# Patient Record
Sex: Female | Born: 1970
Health system: Southern US, Community
[De-identification: ages and names within clinical notes are randomized; demographics above are authoritative.]

## PROBLEM LIST (undated history)

## (undated) DIAGNOSIS — T7840XA Allergy, unspecified, initial encounter: Secondary | ICD-10-CM

## (undated) DIAGNOSIS — E0590 Thyrotoxicosis, unspecified without thyrotoxic crisis or storm: Secondary | ICD-10-CM

## (undated) DIAGNOSIS — C801 Malignant (primary) neoplasm, unspecified: Secondary | ICD-10-CM

## (undated) DIAGNOSIS — G4700 Insomnia, unspecified: Secondary | ICD-10-CM

## (undated) DIAGNOSIS — E785 Hyperlipidemia, unspecified: Secondary | ICD-10-CM

## (undated) DIAGNOSIS — R110 Nausea: Secondary | ICD-10-CM

## (undated) DIAGNOSIS — F329 Major depressive disorder, single episode, unspecified: Secondary | ICD-10-CM

## (undated) DIAGNOSIS — E039 Hypothyroidism, unspecified: Secondary | ICD-10-CM

## (undated) DIAGNOSIS — J309 Allergic rhinitis, unspecified: Secondary | ICD-10-CM

## (undated) DIAGNOSIS — R1013 Epigastric pain: Secondary | ICD-10-CM

## (undated) DIAGNOSIS — R740 Nonspecific elevation of levels of transaminase and lactic acid dehydrogenase [LDH]: Secondary | ICD-10-CM

## (undated) HISTORY — DX: Major depressive disorder, single episode, unspecified: F32.9

## (undated) HISTORY — DX: Hyperlipidemia, unspecified: E78.5

## (undated) HISTORY — PX: OTHER SURGICAL HISTORY: SHX169

## (undated) HISTORY — DX: Hypothyroidism, unspecified: E03.9

## (undated) HISTORY — DX: Thyrotoxicosis, unspecified without thyrotoxic crisis or storm: E05.90

## (undated) HISTORY — PX: TUBAL LIGATION: SHX77

## (undated) HISTORY — DX: Allergic rhinitis, unspecified: J30.9

## (undated) HISTORY — DX: Insomnia, unspecified: G47.00

## (undated) HISTORY — DX: Nausea: R11.0

## (undated) HISTORY — DX: Allergy, unspecified, initial encounter: T78.40XA

## (undated) HISTORY — DX: Nonspecific elevation of levels of transaminase and lactic acid dehydrogenase (ldh): R74.0

## (undated) HISTORY — PX: APPENDECTOMY: SHX54

## (undated) HISTORY — DX: Epigastric pain: R10.13

---

## 1999-03-28 ENCOUNTER — Other Ambulatory Visit: Admission: RE | Admit: 1999-03-28 | Discharge: 1999-03-28 | Payer: Self-pay | Admitting: Obstetrics & Gynecology

## 2003-03-31 ENCOUNTER — Encounter: Admission: RE | Admit: 2003-03-31 | Discharge: 2003-03-31 | Payer: Self-pay | Admitting: Family Medicine

## 2003-03-31 ENCOUNTER — Encounter: Payer: Self-pay | Admitting: Family Medicine

## 2003-04-14 ENCOUNTER — Encounter (HOSPITAL_COMMUNITY): Admission: RE | Admit: 2003-04-14 | Discharge: 2003-07-13 | Payer: Self-pay | Admitting: Endocrinology

## 2003-04-15 ENCOUNTER — Encounter: Payer: Self-pay | Admitting: Endocrinology

## 2003-04-30 ENCOUNTER — Encounter: Payer: Self-pay | Admitting: Endocrinology

## 2003-10-30 ENCOUNTER — Emergency Department (HOSPITAL_COMMUNITY): Admission: AD | Admit: 2003-10-30 | Discharge: 2003-10-30 | Payer: Self-pay | Admitting: Family Medicine

## 2003-12-06 ENCOUNTER — Other Ambulatory Visit: Admission: RE | Admit: 2003-12-06 | Discharge: 2003-12-06 | Payer: Self-pay | Admitting: Obstetrics & Gynecology

## 2004-12-29 ENCOUNTER — Emergency Department (HOSPITAL_COMMUNITY): Admission: EM | Admit: 2004-12-29 | Discharge: 2004-12-29 | Payer: Self-pay | Admitting: Emergency Medicine

## 2009-08-13 HISTORY — PX: CHOLECYSTECTOMY: SHX55

## 2010-03-23 ENCOUNTER — Emergency Department (HOSPITAL_COMMUNITY): Admission: EM | Admit: 2010-03-23 | Discharge: 2010-03-23 | Payer: Self-pay | Admitting: Family Medicine

## 2010-04-25 ENCOUNTER — Ambulatory Visit: Payer: Self-pay | Admitting: Internal Medicine

## 2010-04-25 LAB — CONVERTED CEMR LAB
ALT: 55 units/L — ABNORMAL HIGH (ref 0–35)
AST: 38 units/L — ABNORMAL HIGH (ref 0–37)
Albumin: 4.1 g/dL (ref 3.5–5.2)
Alkaline Phosphatase: 63 units/L (ref 39–117)
BUN: 14 mg/dL (ref 6–23)
Basophils Absolute: 0 10*3/uL (ref 0.0–0.1)
Basophils Relative: 0.3 % (ref 0.0–3.0)
Bilirubin Urine: NEGATIVE
Bilirubin, Direct: 0.2 mg/dL (ref 0.0–0.3)
CO2: 29 meq/L (ref 19–32)
Calcium: 9.3 mg/dL (ref 8.4–10.5)
Chloride: 103 meq/L (ref 96–112)
Cholesterol: 159 mg/dL (ref 0–200)
Creatinine, Ser: 0.7 mg/dL (ref 0.4–1.2)
Eosinophils Absolute: 0.1 10*3/uL (ref 0.0–0.7)
Eosinophils Relative: 1.3 % (ref 0.0–5.0)
GFR calc non Af Amer: 100.39 mL/min (ref >60)
Glucose, Bld: 74 mg/dL (ref 70–99)
HCT: 36.2 % (ref 36.0–46.0)
HDL: 66.9 mg/dL (ref >39.00)
Hemoglobin, Urine: NEGATIVE
Hemoglobin: 12.4 g/dL (ref 12.0–15.0)
Ketones, ur: 40 mg/dL
LDL Cholesterol: 85 mg/dL (ref 0–99)
Leukocytes, UA: NEGATIVE
Lymphocytes Relative: 35.3 % (ref 12.0–46.0)
Lymphs Abs: 2.1 10*3/uL (ref 0.7–4.0)
MCHC: 34.1 g/dL (ref 30.0–36.0)
MCV: 97.7 fL (ref 78.0–100.0)
Monocytes Absolute: 0.4 10*3/uL (ref 0.1–1.0)
Monocytes Relative: 6 % (ref 3.0–12.0)
Neutro Abs: 3.4 10*3/uL (ref 1.4–7.7)
Neutrophils Relative %: 57.1 % (ref 43.0–77.0)
Nitrite: NEGATIVE
Platelets: 215 10*3/uL (ref 150.0–400.0)
Potassium: 3.8 meq/L (ref 3.5–5.1)
RBC: 3.71 M/uL — ABNORMAL LOW (ref 3.87–5.11)
RDW: 14.3 % (ref 11.5–14.6)
Sodium: 139 meq/L (ref 135–145)
Specific Gravity, Urine: 1.025 (ref 1.000–1.030)
TSH: 0.06 microintl units/mL — ABNORMAL LOW (ref 0.35–5.50)
Total Bilirubin: 0.9 mg/dL (ref 0.3–1.2)
Total CHOL/HDL Ratio: 2
Total Protein, Urine: NEGATIVE mg/dL
Total Protein: 7.2 g/dL (ref 6.0–8.3)
Triglycerides: 36 mg/dL (ref 0.0–149.0)
Urine Glucose: NEGATIVE mg/dL
Urobilinogen, UA: 0.2 (ref 0.0–1.0)
VLDL: 7.2 mg/dL (ref 0.0–40.0)
WBC: 5.9 10*3/uL (ref 4.5–10.5)
pH: 6 (ref 5.0–8.0)

## 2010-05-02 ENCOUNTER — Ambulatory Visit: Payer: Self-pay | Admitting: Internal Medicine

## 2010-05-02 DIAGNOSIS — E039 Hypothyroidism, unspecified: Secondary | ICD-10-CM | POA: Insufficient documentation

## 2010-05-02 DIAGNOSIS — E0590 Thyrotoxicosis, unspecified without thyrotoxic crisis or storm: Secondary | ICD-10-CM

## 2010-05-02 DIAGNOSIS — R740 Nonspecific elevation of levels of transaminase and lactic acid dehydrogenase [LDH]: Secondary | ICD-10-CM

## 2010-05-02 DIAGNOSIS — G4700 Insomnia, unspecified: Secondary | ICD-10-CM

## 2010-05-02 DIAGNOSIS — F329 Major depressive disorder, single episode, unspecified: Secondary | ICD-10-CM

## 2010-05-02 DIAGNOSIS — R7401 Elevation of levels of liver transaminase levels: Secondary | ICD-10-CM

## 2010-05-02 DIAGNOSIS — F3289 Other specified depressive episodes: Secondary | ICD-10-CM

## 2010-05-02 DIAGNOSIS — F32A Depression, unspecified: Secondary | ICD-10-CM | POA: Insufficient documentation

## 2010-05-02 HISTORY — DX: Hypothyroidism, unspecified: E03.9

## 2010-05-02 HISTORY — DX: Thyrotoxicosis, unspecified without thyrotoxic crisis or storm: E05.90

## 2010-05-02 HISTORY — DX: Major depressive disorder, single episode, unspecified: F32.9

## 2010-05-02 HISTORY — DX: Other specified depressive episodes: F32.89

## 2010-05-02 HISTORY — DX: Insomnia, unspecified: G47.00

## 2010-05-02 HISTORY — DX: Elevation of levels of liver transaminase levels: R74.01

## 2010-05-30 ENCOUNTER — Ambulatory Visit: Payer: Self-pay | Admitting: Internal Medicine

## 2010-05-30 LAB — CONVERTED CEMR LAB: TSH: 0.89 microintl units/mL (ref 0.35–5.50)

## 2010-05-31 LAB — CONVERTED CEMR LAB
HCV Ab: NEGATIVE
Hepatitis B Surface Ag: NEGATIVE

## 2010-06-02 ENCOUNTER — Telehealth: Payer: Self-pay | Admitting: Internal Medicine

## 2010-06-19 ENCOUNTER — Ambulatory Visit: Payer: Self-pay | Admitting: Internal Medicine

## 2010-06-19 DIAGNOSIS — R110 Nausea: Secondary | ICD-10-CM

## 2010-06-19 DIAGNOSIS — R1013 Epigastric pain: Secondary | ICD-10-CM

## 2010-06-19 HISTORY — DX: Epigastric pain: R10.13

## 2010-06-19 HISTORY — DX: Nausea: R11.0

## 2010-06-20 LAB — CONVERTED CEMR LAB: Alkaline Phosphatase: 60 units/L (ref 39–117)

## 2010-09-02 ENCOUNTER — Encounter: Payer: Self-pay | Admitting: Internal Medicine

## 2010-09-12 NOTE — Progress Notes (Signed)
Summary: lab results  Phone Note Call from Patient   Caller: Patient Summary of Call: pt left vm asking about her 05-30-10 labs.  I informed her Dr Jonny Ruiz reviewed those and he states they were all neg/normal. Initial call taken by: Lanier Prude, [facility]),  June 02, 2010 2:29 PM

## 2010-09-12 NOTE — Assessment & Plan Note (Signed)
Summary: NEW [employer] PT--PKG--#---STC   Vital Signs:  Patient profile:   40 year old female Height:      67.5 inches Weight:      174.38 pounds BMI:     27.01 O2 Sat:      98 % on Room air Temp:     98.5 degrees F oral Pulse rate:   68 / minute BP sitting:   92 / 60  (left arm) Cuff size:   regular  Vitals Entered By: Zella Ball Ewing CMA Duncan Dull) (May 02, 2010 9:38 AM)  O2 Flow:  Room air  CC: Adult Physical, New UHC Pt./RE   CC:  Adult Physical and New UHC Pt./RE.  History of Present Illness: here to establish with wellness, c/o jitteriness recently and recurring insomnia, without worsening depressive symptoms or increased anxiety/panic.  Pt denies CP, worsening sob, doe, wheezing, orthopnea, pnd, worsening LE edema, palps, dizziness or syncope  Pt denies new neuro symptoms such as headache, facial or extremity weakness  Pt denies polydipsia, polyuria,  Overall good compliance with meds, trying to follow low chol diet, wt stable, little excercise however   Preventive Screening-Counseling & Management  Alcohol-Tobacco     Smoking Status: never      Drug Use:  no.    Problems Prior to Update: 1)  Transaminases, Serum, Elevated  (ICD-790.4) 2)  Insomnia  (ICD-780.52) 3)  Hypothyroidism  (ICD-244.9) 4)  Hyperthyroidism  (ICD-242.90) 5)  Depression  (ICD-311)  Medications Prior to Update: 1)  None  Current Medications (verified): 1)  Levothyroxine Sodium 100 Mcg Tabs (Levothyroxine Sodium) .Marland Kitchen.. 1po Once Daily 2)  Zolpidem Tartrate 10 Mg Tabs (Zolpidem Tartrate) .Marland Kitchen.. 1po At Bedtime As Needed  Allergies (verified): No Known Drug Allergies  Past History:  Family History: Last updated: 05/02/2010 mother with smoker, hx of ETOH, depression father ETOH,  heart disease, elev chol, DM, HTN grandparent with heart disease, HTN, elev chol and stroke and DM grandmother and grandfather with COPD  Social History: Last updated: 05/02/2010 Married/separated 3  biol children, 1  adopted Never Smoked Alcohol use-yes Drug use-no work   -  Therapist, music - textile  - since 1999  Risk Factors: Smoking Status: never (05/02/2010)  Past Medical History: Depression hjx of Hyperthyroidism s/p I 131 on replacement Hypothyroidism chronic right side pain/chest/abd  Past Surgical History: s/p compound fx left femur s/p rod , now removed Appendectomy Cholecystectomy Tubal ligation  Family History: Reviewed history and no changes required. mother with smoker, hx of ETOH, depression father ETOH,  heart disease, elev chol, DM, HTN grandparent with heart disease, HTN, elev chol and stroke and DM grandmother and grandfather with COPD  Social History: Reviewed history and no changes required. 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         all otherwise negative per pt -    Physical Exam  General:  alert and overweight-appearing.   Head:  normocephalic and atraumatic.   Eyes:  vision grossly intact, pupils equal, and  pupils round.   Ears:  R ear normal and L ear normal.   Nose:  no external deformity and no nasal discharge.   Mouth:  no gingival abnormalities and pharynx pink and moist.   Neck:  supple and no masses.   Lungs:  normal respiratory effort and normal breath sounds.   Heart:  normal rate and regular rhythm.   Abdomen:  soft,  non-tender, and normal bowel sounds.   Msk:  no joint tenderness and no joint swelling.   Extremities:  no edema, no erythema  Neurologic:  cranial nerves II-XII intact and strength normal in all extremities.     Impression & Recommendations:  Problem # 1:  Preventive Health Care (ICD-V70.0) Overall doing well, age appropriate education and counseling updated and referral for appropriate preventive services done unless declined, immunizations up to date or declined, diet counseling done if overweight, urged to quit smoking if smokes , most recent labs reviewed and current ordered if appropriate, ecg reviewed or declined (interpretation per ECG scanned in the EMR if done); information regarding Medicare Prevention requirements given if appropriate; speciality referrals updated as appropriate   Problem # 2:  INSOMNIA (ICD-780.52)  Her updated medication list for this problem includes:    Zolpidem Tartrate 10 Mg Tabs (Zolpidem tartrate) .Marland Kitchen... 1po at bedtime as needed treat as above, f/u any worsening signs or symptoms   Problem # 3:  HYPOTHYROIDISM (ICD-244.9)  Her updated medication list for this problem includes:    Levothyroxine Sodium 100 Mcg Tabs (Levothyroxine sodium) .Marland Kitchen... 1po once daily overcontrolled- to redue to 100 once daily , f/u TSH in 4 wks  Labs Reviewed: TSH: 0.06 (04/25/2010)    Chol: 159 (04/25/2010)   HDL: 66.90 (04/25/2010)   LDL: 85 (04/25/2010)   TG: 36.0 (04/25/2010)  Problem # 4:  TRANSAMINASES, SERUM, ELEVATED (ICD-790.4) prob due to fatty liver but to check hep profile next lab draw  Complete Medication List: 1)  Levothyroxine Sodium 100 Mcg Tabs (Levothyroxine sodium) .Marland Kitchen.. 1po once daily 2)  Zolpidem Tartrate 10 Mg Tabs (Zolpidem tartrate) .Marland Kitchen.. 1po at bedtime as needed  Other Orders: Admin 1st Vaccine (14782) Flu Vaccine 9yrs + [phone])  Patient Instructions: 1)  please consider baseline mamogram at [facility] on [address] 2)  you had the flu shot  today 3)  decrease the thyroid med to 100 per day 4)  Please return in 4 wks for LAB only: 5)  TSH prior to visit, ICD-9: 244.8 6)  acute hepatitis profile: 790.4 7)  Please call the number on the [facility] Card for results of your testing  8)  Please schedule a follow-up appointment in 1 year or sooner if needed Prescriptions: LEVOTHYROXINE SODIUM 100 MCG TABS (LEVOTHYROXINE SODIUM) 1po once daily  #90 x 3   Entered and Authorized by:   Corwin Levins MD   Signed by:   Corwin Levins MD on 05/02/2010   Method used:   [facility] to        [facility] [phone]* (retail)       [address]       [place], [place]  [address]       Ph: [phone]       Fax: [phone]   RxID:   [id number] ZOLPIDEM TARTRATE 10 MG TABS (ZOLPIDEM TARTRATE) 1po at bedtime as needed  #90 x 1   Entered and Authorized by:   Corwin Levins MD   Signed  by:   Corwin Levins MD on 05/02/2010   Method used:   Print then Give to Patient   RxID:   [id number] LEVOTHYROXINE SODIUM 100 MCG TABS (LEVOTHYROXINE SODIUM) 1po once daily  #90 x 3   Entered and Authorized by:   Corwin Levins MD   Signed by:   Corwin Levins MD on 05/02/2010   Method used:   Print then Give to Patient   RxID:   [id number]    Immunization History:  Tetanus/Td Immunization History:    Tetanus/Td:  historical (04/13/2004)    Flu Vaccine Consent Questions     Do you have a history of severe allergic reactions to this vaccine? no    Any prior history of allergic reactions to egg and/or gelatin? no    Do you have a sensitivity to the preservative Thimersol? no    Do you have a past history of Guillan-Barre Syndrome? no    Do you currently have an acute febrile illness? no    Have you ever had a severe reaction to latex? no    Vaccine information given and explained to patient? yes    Are you currently pregnant? no    Lot Number:[id number]   Exp Date:02/10/2011   Site Given  Left Deltoid IMlu

## 2010-09-12 NOTE — Assessment & Plan Note (Signed)
Summary: nausea/bloating/abdominal pain/john/pt can not come on jj tim...   Vital Signs:  Patient profile:   40 year old female Height:      67.5 inches (171.45 cm) Weight:      176.4 pounds (80.18 kg) O2 Sat:      98 % on Room air Temp:     98.3 degrees F (36.83 degrees C) oral Pulse rate:   70 / minute BP sitting:   100 / 68  (left arm) Cuff size:   regular  Vitals Entered By: Orlan Leavens RMA (June 19, 2010 2:29 PM)  O2 Flow:  Room air CC: Abdominal pain/ Nausea/ bloated Is Patient Diabetic? No Pain Assessment Patient in pain? yes     Location: abdominal pain Type: aching   Primary Care Provider:  Corwin Levins MD  CC:  Abdominal pain/ Nausea/ bloated.  History of Present Illness:  Abdominal Pain      This is a 40 year old woman who presents with Abdominal pain.  The symptoms began 1 month ago, but progressively worse in past 1 week.  On a scale of 1 to 10, the intensity is described as a 6.  GB removed spring 2011 due to similar symptoms.  The patient reports nausea and diarrhea (worse since GB out), but denies vomiting, constipation, melena, hematochezia, and hematemesis.  The location of the pain is epigastric and left upper quadrant.  The pain is described as intermittent and dull.  The patient denies the following symptoms: fever, weight loss, dysuria, chest pain, missed menstrual period, and vaginal bleeding.  The pain is worse with food.  The pain is better with rest.    Clinical Review Panels:  CBC   WBC:  5.9 (04/25/2010)   RBC:  3.71 (04/25/2010)   Hgb:  12.4 (04/25/2010)   Hct:  36.2 (04/25/2010)   Platelets:  215.0 (04/25/2010)   MCV  97.7 (04/25/2010)   MCHC  34.1 (04/25/2010)   RDW  14.3 (04/25/2010)   PMN:  57.1 (04/25/2010)   Lymphs:  35.3 (04/25/2010)   Monos:  6.0 (04/25/2010)   Eosinophils:  1.3 (04/25/2010)   Basophil:  0.3 (04/25/2010)  Complete Metabolic Panel   Glucose:  74 (04/25/2010)   Sodium:  139 (04/25/2010)   Potassium:  3.8  (04/25/2010)   Chloride:  103 (04/25/2010)   CO2:  29 (04/25/2010)   BUN:  14 (04/25/2010)   Creatinine:  0.7 (04/25/2010)   Albumin:  4.1 (04/25/2010)   Total Protein:  7.2 (04/25/2010)   Calcium:  9.3 (04/25/2010)   Total Bili:  0.9 (04/25/2010)   Alk Phos:  63 (04/25/2010)   SGPT (ALT):  55 (04/25/2010)   SGOT (AST):  38 (04/25/2010)   Current Medications (verified): 1)  Levothyroxine Sodium 100 Mcg Tabs (Levothyroxine Sodium) .Marland Kitchen.. 1po Once Daily 2)  Zolpidem Tartrate 10 Mg Tabs (Zolpidem Tartrate) .Marland Kitchen.. 1po At Bedtime As Needed  Allergies (verified): No Known Drug Allergies  Past History:  Past Medical History: Depression hx of Hyperthyroidism s/p I 131 on replacement Hypothyroidism chronic right side pain/chest/abd   Past Surgical History: s/p compound fx left femur s/p rod , now removed Appendectomy Cholecystectomy  Tubal ligation  Review of Systems  The patient denies fever, chest pain, melena, hematochezia, and severe indigestion/heartburn.    Physical Exam  General:  alert and overweight-appearing.   Lungs:  normal respiratory effort and normal breath sounds.   Heart:  normal rate and regular rhythm.   Abdomen:  soft, min tender along  epigatric and LUQ region, no R/G; normal bowel sounds.     Impression & Recommendations:  Problem # 1:  TRANSAMINASES, SERUM, ELEVATED (ICD-790.4) noted on CPX labs 04/2010 - pt reports hx same prior to chole recent acute hep panel checked and neg 05/2010(reviewed with pt) recheck now, esp with inc nausea and bloating/pain symptoms  also check abd [place], ?retained stone or other abn - may need GI evaluation Orders: Radiology Referral (Radiology) TLB-Hepatic/Liver Function Pnl (80076-HEPATIC)  Problem # 2:  NAUSEA (ICD-787.02) may be related to liver wit abn LFTs - no anemia on recent labs and few risks for ulcer dz - check labs and [place] as above -  symptoms mgmt with promethazine, try probiotic may need GI eval for same  if unclear cause to consider EGD Orders: Radiology Referral (Radiology) TLB-Hepatic/Liver Function Pnl (80076-HEPATIC)  Problem # 3:  ABDOMINAL PAIN, EPIGASTRIC (ICD-789.06) see above - benign exam today despite mild tenderness Orders: Radiology Referral (Radiology) TLB-Hepatic/Liver Function Pnl (80076-HEPATIC)  Complete Medication List: 1)  Levothyroxine Sodium 100 Mcg Tabs (Levothyroxine sodium) .Marland Kitchen.. 1po once daily 2)  Zolpidem Tartrate 10 Mg Tabs (Zolpidem tartrate) .Marland Kitchen.. 1po at bedtime as needed 3)  Promethazine Hcl 12.5 Mg Tabs (Promethazine hcl) .Marland Kitchen.. 1 by mouth every 6 hours as needed for nausea symptoms - may cause sedation 4)  Align Caps (Probiotic product) .Marland Kitchen.. 1 by mouth once daily  Patient Instructions: 1)  it was good to see you today. 2)  we'll make referral for abdominal ultrasound. Our office will contact you regarding this appointment once made.  3)  test(s) ordered today - your results will be posted on the phone tree for review in 48-72 hours from the time of test completion; call [phone] and enter your 9 digit MRN (listed above on this page, just below your name); if any changes need to be made or there are abnormal results, you will be contacted directly.  4)  try promethazine for nausea and Align for bloating and abd discomfort symptoms  5)  we may consider refer to GI depending on these results and your response to medications to further evaluate for underlying cause of problem 6)  Continue to get plenty of rest, drink lots of clear liquids, avoid dairy, and use Tylenol or Ibuprofen for comfort. Return in 7-10 days if you're not better:sooner if you're feeling worse. Prescriptions: PROMETHAZINE HCL 12.5 MG TABS (PROMETHAZINE HCL) 1 by mouth every 6 hours as needed for nausea symptoms - may cause sedation  #20 x 1   Entered and Authorized by:   Newt Lukes MD   Signed by:   Newt Lukes MD on 06/19/2010   Method used:   [facility] to        [facility] [phone]* (retail)       [address]       [place], [place]  [address]       Ph: [phone]       Fax: [phone]   RxID:   [id number]    Orders Added: 1)  Radiology Referral [Radiology] 2)  TLB-Hepatic/Liver Function Pnl [80076-HEPATIC] 3)  Est. Patient Level IV [[address]387]

## 2010-10-12 ENCOUNTER — Ambulatory Visit: Payer: Self-pay | Admitting: Internal Medicine

## 2010-10-12 DIAGNOSIS — Z0289 Encounter for other administrative examinations: Secondary | ICD-10-CM

## 2011-04-20 ENCOUNTER — Other Ambulatory Visit: Payer: Self-pay

## 2011-04-20 MED ORDER — LEVOTHYROXINE SODIUM 100 MCG PO TABS: 100.0000 ug | ORAL_TABLET | Freq: Every day | ORAL | Status: DC

## 2011-04-25 ENCOUNTER — Other Ambulatory Visit: Payer: Self-pay

## 2011-04-25 MED ORDER — LEVOTHYROXINE SODIUM 100 MCG PO TABS: 100.0000 ug | ORAL_TABLET | Freq: Every day | ORAL | Status: DC

## 2011-04-26 ENCOUNTER — Encounter: Payer: Self-pay | Admitting: Internal Medicine

## 2011-04-26 ENCOUNTER — Ambulatory Visit (INDEPENDENT_AMBULATORY_CARE_PROVIDER_SITE_OTHER): Payer: 59 | Admitting: Internal Medicine

## 2011-04-26 ENCOUNTER — Other Ambulatory Visit (INDEPENDENT_AMBULATORY_CARE_PROVIDER_SITE_OTHER): Payer: 59

## 2011-04-26 VITALS — BP 94/62 | HR 74 | Temp 98.3°F | Ht 67.0 in | Wt 154.4 lb

## 2011-04-26 DIAGNOSIS — R300 Dysuria: Secondary | ICD-10-CM

## 2011-04-26 DIAGNOSIS — Z23 Encounter for immunization: Secondary | ICD-10-CM

## 2011-04-26 DIAGNOSIS — Z0000 Encounter for general adult medical examination without abnormal findings: Secondary | ICD-10-CM

## 2011-04-26 DIAGNOSIS — N390 Urinary tract infection, site not specified: Secondary | ICD-10-CM | POA: Insufficient documentation

## 2011-04-26 LAB — CBC WITH DIFFERENTIAL/PLATELET
Basophils Absolute: 0 10*3/uL (ref 0.0–0.1)
Eosinophils Absolute: 0.1 10*3/uL (ref 0.0–0.7)
HCT: 37.3 % (ref 36.0–46.0)
Hemoglobin: 12.2 g/dL (ref 12.0–15.0)
MCHC: 32.6 g/dL (ref 30.0–36.0)
MCV: 97.7 fl (ref 78.0–100.0)
Monocytes Absolute: 0.4 10*3/uL (ref 0.1–1.0)
Monocytes Relative: 7.4 % (ref 3.0–12.0)
Neutro Abs: 3.5 10*3/uL (ref 1.4–7.7)
Platelets: 212 10*3/uL (ref 150.0–400.0)
RBC: 3.82 Mil/uL — ABNORMAL LOW (ref 3.87–5.11)
RDW: 14.6 % (ref 11.5–14.6)

## 2011-04-26 LAB — LIPID PANEL
Cholesterol: 162 mg/dL (ref 0–200)
HDL: 74.4 mg/dL (ref >39.00)
Total CHOL/HDL Ratio: 2
Triglycerides: 45 mg/dL (ref 0.0–149.0)

## 2011-04-26 LAB — POCT URINALYSIS DIPSTICK
Bilirubin, UA: NEGATIVE
Nitrite, UA: NEGATIVE
Protein, UA: NEGATIVE
Urobilinogen, UA: NEGATIVE
pH, UA: 6.5

## 2011-04-26 LAB — HEPATIC FUNCTION PANEL
ALT: 15 U/L (ref 0–35)
Albumin: 4.2 g/dL (ref 3.5–5.2)
Alkaline Phosphatase: 59 U/L (ref 39–117)
Bilirubin, Direct: 0.1 mg/dL (ref 0.0–0.3)

## 2011-04-26 LAB — BASIC METABOLIC PANEL
Calcium: 9 mg/dL (ref 8.4–10.5)
Glucose, Bld: 102 mg/dL — ABNORMAL HIGH (ref 70–99)
Potassium: 3.6 mEq/L (ref 3.5–5.1)

## 2011-04-26 MED ORDER — LEVOTHYROXINE SODIUM 100 MCG PO TABS: 100.0000 ug | ORAL_TABLET | Freq: Every day | ORAL | Status: DC

## 2011-04-26 MED ORDER — CIPROFLOXACIN HCL 500 MG PO TABS: 500.0000 mg | ORAL_TABLET | Freq: Two times a day (BID) | ORAL | Status: AC

## 2011-04-26 NOTE — Assessment & Plan Note (Signed)
Mild to mod, for antibx course,  to f/u any worsening symptoms or concerns, for urine cx 

## 2011-04-26 NOTE — Assessment & Plan Note (Signed)

## 2011-04-26 NOTE — Patient Instructions (Addendum)
Take all new medications as prescribed - the antibiotic Continue all other medications as before  - the thyroid Your refill was sent to the pharmacy (and the antibiotic) You had the flu shot today Your urine will be sent for culture as well Please go to LAB in the Basement for the blood test to be done today Please call the phone number [phone] (the PhoneTree System) for results of testing in 2-3 days;  When calling, simply dial the number, and when prompted enter the MRN number above (the Medical Record Number) and the # key, then the message should start. Please return in 1 year for your yearly visit, or sooner if needed, with Lab testing done 3-5 days before

## 2011-04-26 NOTE — Progress Notes (Signed)
Subjective:    Patient ID: [name], female    DOB: [birth year], 40 y.o.   MRN: [id number]  HPI  Here for wellness and f/u;  Overall doing ok;  Pt denies CP, worsening SOB, DOE, wheezing, orthopnea, PND, worsening LE edema, palpitations, dizziness or syncope.  Pt denies neurological change such as new Headache, facial or extremity weakness.  Pt denies polydipsia, polyuria, or low sugar symptoms. Pt states overall good compliance with treatment and medications, good tolerability, and trying to follow lower cholesterol diet.  Pt denies worsening depressive symptoms, suicidal ideation or panic. No fever, wt loss, night sweats, loss of appetite, or other constitutional symptoms.  Pt states good ability with ADL's, low fall risk, home safety reviewed and adequate, no significant changes in hearing or vision, and occasionally active with exercise.  Also with 2 days onset urinary freq and dysuria. Past Medical History  Diagnosis Date  . Abdominal pain, epigastric 06/19/2010  . DEPRESSION 05/02/2010  . HYPERTHYROIDISM 05/02/2010  . HYPOTHYROIDISM 05/02/2010  . INSOMNIA 05/02/2010  . NAUSEA 06/19/2010  . TRANSAMINASES, SERUM, ELEVATED 05/02/2010   Past Surgical History  Procedure Date  . S/p compound fx left femur s/p rod, now removed   . Appendectomy   . Cholecystectomy   . Tubal ligation     reports that she has never smoked. She does not have any smokeless tobacco history on file. She reports that she drinks alcohol. She reports that she does not use illicit drugs. family history includes Alcohol abuse in her father and mother; COPD in her others; Depression in her mother; Diabetes in her father and other; Heart disease in her father and other; Hyperlipidemia in her father and other; Hypertension in her father and other; and Stroke in her mother and other. Allergies  Allergen Reactions  . Sulfa Antibiotics     hives   No current outpatient prescriptions on file prior to visit.   Review of  Systems Review of Systems  Constitutional: Negative for diaphoresis, activity change, appetite change and unexpected weight change.  HENT: Negative for hearing loss, ear pain, facial swelling, mouth sores and neck stiffness.   Eyes: Negative for pain, redness and visual disturbance.  Respiratory: Negative for shortness of breath and wheezing.   Cardiovascular: Negative for chest pain and palpitations.  Gastrointestinal: Negative for diarrhea, blood in stool, abdominal distention and rectal pain.  Genitourinary: Negative for hematuria, flank pain and decreased urine volume.  Musculoskeletal: Negative for myalgias and joint swelling.  Skin: Negative for color change and wound.  Neurological: Negative for syncope and numbness.  Hematological: Negative for adenopathy.  Psychiatric/Behavioral: Negative for hallucinations, self-injury, decreased concentration and agitation.      Objective:   Physical Exam BP 94/62  Pulse 74  Temp(Src) 98.3 F (36.8 C) (Oral)  Ht 5\' 7"  (1.702 m)  Wt 154 lb 6 oz (70.024 kg)  BMI 24.18 kg/m2  SpO2 98%  LMP 04/24/2011 Physical Exam  VS noted Constitutional: Pt is oriented to person, place, and time. Appears well-developed and well-nourished.  HENT:  Head: Normocephalic and atraumatic.  Right Ear: External ear normal.  Left Ear: External ear normal.  Nose: Nose normal.  Mouth/Throat: Oropharynx is clear and moist.  Eyes: Conjunctivae and EOM are normal. Pupils are equal, round, and reactive to light.  Neck: Normal range of motion. Neck supple. No JVD present. No tracheal deviation present.  Cardiovascular: Normal rate, regular rhythm, normal heart sounds and intact distal pulses.   Pulmonary/Chest: Effort normal and breath  sounds normal.  Abdominal: Soft. Bowel sounds are normal. There is no tenderness. except for mild low mid abd tender, no guarding or rebound Musculoskeletal: Normal range of motion. Exhibits no edema.  No flank tender Lymphadenopathy:   Has no cervical adenopathy.  Neurological: Pt is alert and oriented to person, place, and time. Pt has normal reflexes. No cranial nerve deficit.  Skin: Skin is warm and dry. No rash noted.  Psychiatric:  Has  normal mood and affect. Behavior is normal.     Assessment & Plan:

## 2011-04-28 LAB — URINE CULTURE

## 2011-05-14 ENCOUNTER — Other Ambulatory Visit: Payer: Self-pay | Admitting: Obstetrics & Gynecology

## 2011-05-14 DIAGNOSIS — R928 Other abnormal and inconclusive findings on diagnostic imaging of breast: Secondary | ICD-10-CM

## 2011-05-28 ENCOUNTER — Ambulatory Visit
Admission: RE | Admit: 2011-05-28 | Discharge: 2011-05-28 | Disposition: A | Discharge status: Home / Self Care | Payer: 59 | Source: Ambulatory Visit | Attending: Obstetrics & Gynecology | Admitting: Obstetrics & Gynecology

## 2011-05-28 ENCOUNTER — Other Ambulatory Visit: Payer: Self-pay | Admitting: Obstetrics & Gynecology

## 2011-05-28 DIAGNOSIS — R928 Other abnormal and inconclusive findings on diagnostic imaging of breast: Secondary | ICD-10-CM

## 2011-06-08 ENCOUNTER — Ambulatory Visit: Payer: 59 | Admitting: Internal Medicine

## 2011-06-08 DIAGNOSIS — Z0289 Encounter for other administrative examinations: Secondary | ICD-10-CM

## 2011-08-02 ENCOUNTER — Other Ambulatory Visit: Payer: Self-pay

## 2011-08-02 MED ORDER — LEVOTHYROXINE SODIUM 100 MCG PO TABS: 100.0000 ug | ORAL_TABLET | Freq: Every day | ORAL | Status: DC

## 2011-09-14 ENCOUNTER — Ambulatory Visit: Payer: 59 | Admitting: Internal Medicine

## 2011-09-14 DIAGNOSIS — Z0289 Encounter for other administrative examinations: Secondary | ICD-10-CM

## 2011-10-12 ENCOUNTER — Ambulatory Visit (INDEPENDENT_AMBULATORY_CARE_PROVIDER_SITE_OTHER): Payer: 59 | Admitting: Internal Medicine

## 2011-10-12 ENCOUNTER — Ambulatory Visit (INDEPENDENT_AMBULATORY_CARE_PROVIDER_SITE_OTHER)
Admission: RE | Admit: 2011-10-12 | Discharge: 2011-10-12 | Disposition: A | Discharge status: Home / Self Care | Payer: 59 | Source: Ambulatory Visit | Attending: Internal Medicine | Admitting: Internal Medicine

## 2011-10-12 ENCOUNTER — Other Ambulatory Visit (INDEPENDENT_AMBULATORY_CARE_PROVIDER_SITE_OTHER): Payer: 59

## 2011-10-12 ENCOUNTER — Encounter: Payer: Self-pay | Admitting: Internal Medicine

## 2011-10-12 DIAGNOSIS — R1033 Periumbilical pain: Secondary | ICD-10-CM | POA: Insufficient documentation

## 2011-10-12 DIAGNOSIS — M25579 Pain in unspecified ankle and joints of unspecified foot: Secondary | ICD-10-CM

## 2011-10-12 DIAGNOSIS — M25571 Pain in right ankle and joints of right foot: Secondary | ICD-10-CM | POA: Insufficient documentation

## 2011-10-12 DIAGNOSIS — R109 Unspecified abdominal pain: Secondary | ICD-10-CM

## 2011-10-12 DIAGNOSIS — M21612 Bunion of left foot: Secondary | ICD-10-CM | POA: Insufficient documentation

## 2011-10-12 DIAGNOSIS — F329 Major depressive disorder, single episode, unspecified: Secondary | ICD-10-CM

## 2011-10-12 DIAGNOSIS — M21619 Bunion of unspecified foot: Secondary | ICD-10-CM

## 2011-10-12 DIAGNOSIS — M21611 Bunion of right foot: Secondary | ICD-10-CM

## 2011-10-12 LAB — URINALYSIS, ROUTINE W REFLEX MICROSCOPIC
Ketones, ur: NEGATIVE
Specific Gravity, Urine: 1.015 (ref 1.000–1.030)
Total Protein, Urine: NEGATIVE
Urine Glucose: NEGATIVE
pH: 7 (ref 5.0–8.0)

## 2011-10-12 MED ORDER — NAPROXEN 500 MG PO TABS: 500.0000 mg | ORAL_TABLET | Freq: Two times a day (BID) | ORAL | Status: DC

## 2011-10-12 NOTE — Patient Instructions (Addendum)
Take all new medications as prescribed Continue all other medications as before Please go to XRAY in the Basement for the x-ray test Please go to LAB in the Basement for just the urine tests Please call the phone number [phone] (the PhoneTree System) for results of testing in 2-3 days;  When calling, simply dial the number, and when prompted enter the MRN number above (the Medical Record Number) and the # key, then the message should start. You will be contacted regarding the referral for: orthopedic for the ankle, and general surgury for the bellybutton

## 2011-10-12 NOTE — Assessment & Plan Note (Signed)
Suspect recurrent umbilic hernia, though none noted at this time;  For gen surgury eval

## 2011-10-14 ENCOUNTER — Encounter: Payer: Self-pay | Admitting: Internal Medicine

## 2011-10-14 NOTE — Assessment & Plan Note (Signed)
stable overall by hx and exam, most recent data reviewed with pt, and pt to continue medical treatment as before Lab Results  Component Value Date   WBC 5.8 04/26/2011   HGB 12.2 04/26/2011   HCT 37.3 04/26/2011   PLT 212.0 04/26/2011   GLUCOSE 102* 04/26/2011   CHOL 162 04/26/2011   TRIG 45.0 04/26/2011   HDL 74.40 04/26/2011   LDLCALC 79 04/26/2011   ALT 15 04/26/2011   AST 22 04/26/2011   NA 139 04/26/2011   K 3.6 04/26/2011   CL 105 04/26/2011   CREATININE 0.7 04/26/2011   BUN 16 04/26/2011   CO2 27 04/26/2011   TSH 2.66 04/26/2011    

## 2011-10-14 NOTE — Progress Notes (Signed)
Subjective:    Patient ID: [name], female    DOB: 1971/01/04, 41 y.o.   MRN: [id number]  HPI  Here with daughter present, c/o ongoing right ankle pain/swelling, tender to right lateral malleolar area that has persisted and not resolved as expected after a rather severe right foot inversion injury while dancing jan 1; though it was simple sprain but has not resolved yet, still limps and favors it;  Walks and stands at work quite a bit, just puts up with the pain and does not want pain med but pain is worse with colder weather, adn a shooting pain seems to go up right lateral leg above the ankle occasionally;  O/w no other neurovasc complaint such as numb, weakness, claudiation, redness, fever, other trauma, hx of gout   Also c/o recurrent nausea with some feeling of abd swelling with discomfort under the ribs anteriorly bilaterally intermittent, mild,  Seems worse for 2 wks prior to onset menses, recurred montthly for several months, with pain at the belly button and swelling;  Has hx of lap ccx with GB removed through navel.  No prior hx of hernia.  BM's make no change.  No other bowel or bladder change or symptoms.  No fever, vomiting, radiation to the back, wt loss, blood.   Denies worsening depressive symptoms, suicidal ideation, or panic, though has ongoing anxiety, not increased recently.  Past Medical History  Diagnosis Date  . Abdominal pain, epigastric 06/19/2010  . DEPRESSION 05/02/2010  . HYPERTHYROIDISM 05/02/2010  . HYPOTHYROIDISM 05/02/2010  . INSOMNIA 05/02/2010  . NAUSEA 06/19/2010  . TRANSAMINASES, SERUM, ELEVATED 05/02/2010   Past Surgical History  Procedure Date  . S/p compound fx left femur s/p rod, now removed   . Appendectomy   . Cholecystectomy   . Tubal ligation     reports that she has never smoked. She does not have any smokeless tobacco history on file. She reports that she drinks alcohol. She reports that she does not use illicit drugs. family history includes  Alcohol abuse in her father and mother; COPD in her others; Depression in her mother; Diabetes in her father and other; Heart disease in her father and other; Hyperlipidemia in her father and other; Hypertension in her father and other; and Stroke in her mother and other. Allergies  Allergen Reactions  . Sulfa Antibiotics     hives   Current Outpatient Prescriptions on File Prior to Visit  Medication Sig Dispense Refill  . levothyroxine (SYNTHROID, LEVOTHROID) 100 MCG tablet Take 1 tablet (100 mcg total) by mouth daily.  90 tablet  3  . Probiotic Product (ALIGN PO) Take by mouth daily.        . promethazine (PHENERGAN) 12.5 MG tablet Take 12.5 mg by mouth every 6 (six) hours as needed.        . zolpidem (AMBIEN) 10 MG tablet Take 10 mg by mouth at bedtime as needed.         Review of Systems Review of Systems  Constitutional: Negative for diaphoresis and unexpected weight change.  HENT: Negative for drooling and tinnitus.   Eyes: Negative for photophobia and visual disturbance.  Respiratory: Negative for choking and stridor.   Gastrointestinal: Negative for vomiting and blood in stool.  Genitourinary: Negative for hematuria and decreased urine volume.  Musculoskeletal: Negative for gait problem.     Objective:   Physical Exam BP 100/62  Pulse 74  Temp(Src) 98.4 F (36.9 C) (Oral)  Ht 5' 8.5" (1.74 m)  Wt 161 lb 5 oz (73.171 kg)  BMI 24.17 kg/m2  SpO2 98% Physical Exam  VS noted Constitutional: Pt appears well-developed and well-nourished.  HENT: Head: Normocephalic.  Right Ear: External ear normal.  Left Ear: External ear normal.  Eyes: Conjunctivae and EOM are normal. Pupils are equal, round, and reactive to light.  Neck: Normal range of motion. Neck supple.  Cardiovascular: Normal rate and regular rhythm.   Pulmonary/Chest: Effort normal and breath sounds normal.  Abd:  Soft, NT, non-distended, + BS but has navel defect, no definite hernia at this time Neurological: Pt is  alert. No cranial nerve deficit.  Skin: Skin is warm. No erythema.  Right ankle with 1-2+ tender swelling just anterior to the medial malleolus with pain in inversion o/w ankle without effusion, and foot o/w neurovasc intact Also with bilat bunions tender, swollen Psychiatric: Pt behavior is normal. Thought content normal. 1-2+ nervous, not depressed affect    Assessment & Plan:

## 2011-10-14 NOTE — Assessment & Plan Note (Signed)
?   [employer] fx - for film today, ortho referral, nsiad prn, declines work note

## 2011-10-14 NOTE — Assessment & Plan Note (Signed)
Also to be address per ortho - mild chronic persistent

## 2011-10-23 ENCOUNTER — Encounter (INDEPENDENT_AMBULATORY_CARE_PROVIDER_SITE_OTHER): Payer: Self-pay | Admitting: Surgery

## 2011-10-24 ENCOUNTER — Ambulatory Visit (INDEPENDENT_AMBULATORY_CARE_PROVIDER_SITE_OTHER): Payer: 59 | Admitting: Surgery

## 2011-10-26 ENCOUNTER — Telehealth (INDEPENDENT_AMBULATORY_CARE_PROVIDER_SITE_OTHER): Payer: Self-pay

## 2011-10-26 NOTE — Telephone Encounter (Signed)
Called pt to see about making an appt with Dr Michaell Cowing b/c the pt was scheduled with Dr Corliss Skains for eval. Umbilical hernia. I advised the pt that Dr Michaell Cowing had done her gallbladder sx so we could make the umbilical hernia appt with Dr Michaell Cowing instead of Dr Corliss Skains unless if she requested to see someone else. The pt did not request any doctor but she told me not to make an appt for now that she would call our office back when she was ready to be seen for this problem.

## 2011-11-20 ENCOUNTER — Ambulatory Visit: Payer: 59 | Admitting: Internal Medicine

## 2011-11-20 ENCOUNTER — Telehealth: Payer: Self-pay

## 2011-11-20 NOTE — Telephone Encounter (Signed)
A user error has taken place: encounter opened in error, closed for administrative reasons.

## 2011-12-06 ENCOUNTER — Ambulatory Visit (INDEPENDENT_AMBULATORY_CARE_PROVIDER_SITE_OTHER): Payer: 59 | Admitting: Internal Medicine

## 2011-12-06 ENCOUNTER — Encounter: Payer: Self-pay | Admitting: Internal Medicine

## 2011-12-06 VITALS — BP 100/68 | HR 58 | Temp 97.5°F | Ht 68.0 in | Wt 162.0 lb

## 2011-12-06 DIAGNOSIS — J019 Acute sinusitis, unspecified: Secondary | ICD-10-CM

## 2011-12-06 DIAGNOSIS — H6690 Otitis media, unspecified, unspecified ear: Secondary | ICD-10-CM

## 2011-12-06 DIAGNOSIS — H6692 Otitis media, unspecified, left ear: Secondary | ICD-10-CM

## 2011-12-06 DIAGNOSIS — E039 Hypothyroidism, unspecified: Secondary | ICD-10-CM

## 2011-12-06 MED ORDER — LEVOFLOXACIN 500 MG PO TABS: 500.0000 mg | ORAL_TABLET | Freq: Every day | ORAL | Status: AC

## 2011-12-06 MED ORDER — ACETAMINOPHEN-CODEINE 300-30 MG PO TABS: 1.0000 | ORAL_TABLET | Freq: Four times a day (QID) | ORAL | Status: AC | PRN

## 2011-12-06 NOTE — Progress Notes (Signed)
Subjective:    Patient ID: [name], female    DOB: 1970/12/03, 41 y.o.   MRN: [id number]  HPI  Here to f/u;  Overall doing well but did see UC approx 3 wks ago with PCN med tx and seemed to improved, but today here with 3 days onset mod to severe left ear /neck/left maxillary sinus pain, pressure, fever and HA, without ST, cough and Pt denies chest pain, increased sob or doe, wheezing, orthopnea, PND, increased LE swelling, palpitations, dizziness or syncope.  Pt denies new neurological symptoms such as new headache, or facial or extremity weakness or numbness   Pt denies polydipsia, polyuria.   Denies hyper or hypo thyroid symptoms such as voice, skin or hair change. Past Medical History  Diagnosis Date  . Abdominal pain, epigastric 06/19/2010  . DEPRESSION 05/02/2010  . HYPERTHYROIDISM 05/02/2010  . HYPOTHYROIDISM 05/02/2010  . INSOMNIA 05/02/2010  . NAUSEA 06/19/2010  . TRANSAMINASES, SERUM, ELEVATED 05/02/2010   Past Surgical History  Procedure Date  . S/p compound fx left femur s/p rod, now removed   . Appendectomy   . Cholecystectomy   . Tubal ligation     reports that she has never smoked. She does not have any smokeless tobacco history on file. She reports that she drinks alcohol. She reports that she does not use illicit drugs. family history includes Alcohol abuse in her father and mother; COPD in her others; Depression in her mother; Diabetes in her father and other; Heart disease in her father and other; Hyperlipidemia in her father and other; Hypertension in her father and other; and Stroke in her mother and other. Allergies  Allergen Reactions  . Sulfa Antibiotics     hives   Current Outpatient Prescriptions on File Prior to Visit  Medication Sig Dispense Refill  . levothyroxine (SYNTHROID, LEVOTHROID) 100 MCG tablet Take 1 tablet (100 mcg total) by mouth daily.  90 tablet  3  . naproxen (NAPROSYN) 500 MG tablet Take 1 tablet (500 mg total) by mouth 2 (two) times daily  with a meal.  60 tablet  2  . Probiotic Product (ALIGN PO) Take by mouth daily.        . promethazine (PHENERGAN) 12.5 MG tablet Take 12.5 mg by mouth every 6 (six) hours as needed.        . zolpidem (AMBIEN) 10 MG tablet Take 10 mg by mouth at bedtime as needed.         Review of Systems Review of Systems  Constitutional: Negative for diaphoresis and unexpected weight change.  HENT: Negative for drooling and tinnitus.   Eyes: Negative for photophobia and visual disturbance.  Respiratory: Negative for choking and stridor.   Gastrointestinal: Negative for vomiting and blood in stool.  Genitourinary: Negative for hematuria and decreased urine volume.  Skin: Negative for color change and wound.  Neurological: Negative for tremors and numbness.      Objective:   Physical Exam BP 100/68  Pulse 58  Temp(Src) 97.5 F (36.4 C) (Oral)  Ht 5\' 8"  (1.727 m)  Wt 162 lb (73.483 kg)  BMI 24.63 kg/m2  SpO2 99% Physical Exam  VS noted, mild ill Constitutional: Pt appears well-developed and well-nourished.  HENT: Head: Normocephalic.  Right Ear: External ear normal.  Left Ear: External ear normal.  left tm severe erythema, right TM normal.  Left maxillary Sinus mod tenderness only;  Pharynx mild erythema Eyes: Conjunctivae and EOM are normal. Pupils are equal, round, and reactive to light.  Neck: Normal range of motion. Neck supple.  Cardiovascular: Normal rate and regular rhythm.   Pulmonary/Chest: Effort normal and breath sounds normal.  Abd:  Soft, NT, non-distended, + BS Neurological: Pt is alert. Not confused Skin: Skin is warm. No erythema. No rash Psychiatric: Pt behavior is normal. Thought content normal. 1+nervous, not depressed affect    Assessment & Plan:

## 2011-12-06 NOTE — Patient Instructions (Addendum)
Take all new medications as prescribed Continue all other medications as before You are given the work note as well You can also take  Mucinex (or it's generic off brand) for congestion

## 2011-12-06 NOTE — Assessment & Plan Note (Signed)
Mild to mod, for antibx course,  to f/u any worsening symptoms or concerns 

## 2011-12-06 NOTE — Assessment & Plan Note (Signed)
stable overall by hx and exam, most recent data reviewed with pt, and pt to continue medical treatment as before Lab Results  Component Value Date   TSH 2.66 04/26/2011    

## 2012-02-12 ENCOUNTER — Other Ambulatory Visit: Payer: 59

## 2012-02-12 ENCOUNTER — Ambulatory Visit (INDEPENDENT_AMBULATORY_CARE_PROVIDER_SITE_OTHER): Payer: 59 | Admitting: Internal Medicine

## 2012-02-12 ENCOUNTER — Encounter: Payer: Self-pay | Admitting: Internal Medicine

## 2012-02-12 VITALS — BP 92/62 | HR 60 | Temp 98.4°F | Ht 68.0 in | Wt 159.0 lb

## 2012-02-12 DIAGNOSIS — R300 Dysuria: Secondary | ICD-10-CM | POA: Insufficient documentation

## 2012-02-12 DIAGNOSIS — E039 Hypothyroidism, unspecified: Secondary | ICD-10-CM

## 2012-02-12 DIAGNOSIS — F329 Major depressive disorder, single episode, unspecified: Secondary | ICD-10-CM

## 2012-02-12 MED ORDER — CEPHALEXIN 500 MG PO CAPS: 500.0000 mg | ORAL_CAPSULE | Freq: Four times a day (QID) | ORAL | Status: DC

## 2012-02-12 NOTE — Patient Instructions (Addendum)
Take all new medications as prescribed - the antibiotic Continue all other medications as before The urine specimen will be sent for culture You will be contacted by phone if any changes need to be made immediately.  Otherwise, you will receive a letter about your results with an explanation.

## 2012-02-13 ENCOUNTER — Telehealth: Payer: Self-pay

## 2012-02-13 DIAGNOSIS — R300 Dysuria: Secondary | ICD-10-CM

## 2012-02-13 MED ORDER — CEPHALEXIN 500 MG PO CAPS: 500.0000 mg | ORAL_CAPSULE | Freq: Four times a day (QID) | ORAL | Status: AC

## 2012-02-13 NOTE — Telephone Encounter (Signed)
Sent antibiotic to [facility] in [place] per pt. Request as was sent to incorrect pharmacy at [facility].

## 2012-02-14 ENCOUNTER — Encounter: Payer: Self-pay | Admitting: Internal Medicine

## 2012-02-14 NOTE — Assessment & Plan Note (Addendum)
Mild to mod, for antibx course,  to f/u any worsening symptoms or concerns, for urine cx 

## 2012-02-14 NOTE — Progress Notes (Signed)
Subjective:    Patient ID: [name], female    DOB: [birth year], 41 y.o.   MRN: [id number]  HPI  Here with acute onset 3 days mild to mod dysuria, frequency, urgency, but no hematuria, n/v, high fever, chillls.  Has had some radiation to the back as well.  Azo helped for the first day, but not now though she has cont'd to take.  Pt denies chest pain, increased sob or doe, wheezing, orthopnea, PND, increased LE swelling, palpitations, dizziness or syncope.   Pt denies polydipsia, polyuria.  Pt denies new neurological symptoms such as new headache, or facial or extremity weakness or numbness.  Denies hyper or hypo thyroid symptoms such as voice, skin or hair change.  Denies worsening depressive symptoms, suicidal ideation, or panic, though has ongoing anxiety, not increased recently.  Past Medical History  Diagnosis Date  . Abdominal pain, epigastric 06/19/2010  . DEPRESSION 05/02/2010  . HYPERTHYROIDISM 05/02/2010  . HYPOTHYROIDISM 05/02/2010  . INSOMNIA 05/02/2010  . NAUSEA 06/19/2010  . TRANSAMINASES, SERUM, ELEVATED 05/02/2010   Past Surgical History  Procedure Date  . S/p compound fx left femur s/p rod, now removed   . Appendectomy   . Cholecystectomy   . Tubal ligation     reports that she has never smoked. She does not have any smokeless tobacco history on file. She reports that she drinks alcohol. She reports that she does not use illicit drugs. family history includes Alcohol abuse in her father and mother; COPD in her others; Depression in her mother; Diabetes in her father and other; Heart disease in her father and other; Hyperlipidemia in her father and other; Hypertension in her father and other; and Stroke in her mother and other. Allergies  Allergen Reactions  . Sulfa Antibiotics     hives   Current Outpatient Prescriptions on File Prior to Visit  Medication Sig Dispense Refill  . levothyroxine (SYNTHROID, LEVOTHROID) 100 MCG tablet Take 1 tablet (100 mcg total) by mouth  daily.  90 tablet  3  . naproxen (NAPROSYN) 500 MG tablet Take 1 tablet (500 mg total) by mouth 2 (two) times daily with a meal.  60 tablet  2  . Probiotic Product (ALIGN PO) Take by mouth daily.        . promethazine (PHENERGAN) 12.5 MG tablet Take 12.5 mg by mouth every 6 (six) hours as needed.        . zolpidem (AMBIEN) 10 MG tablet Take 10 mg by mouth at bedtime as needed.         Review of Systems Constitutional: Negative for diaphoresis and unexpected weight change.  Eyes: Negative for photophobia and visual disturbance.  Respiratory: Negative for choking and stridor.   Gastrointestinal: Negative for vomiting and blood in stool.  Genitourinary: Negative for hematuria and decreased urine volume.  Musculoskeletal: Negative for gait problem.  Skin: Negative for color change and wound.       Objective:   Physical Exam BP 92/62  Pulse 60  Temp 98.4 F (36.9 C) (Oral)  Ht 5\' 8"  (1.727 m)  Wt 159 lb (72.122 kg)  BMI 24.18 kg/m2  SpO2 98% Physical Exam  VS noted, mild ill appearing Constitutional: Pt appears well-developed and well-nourished.  HENT: Head: Normocephalic.  Right Ear: External ear normal.  Left Ear: External ear normal.  Eyes: Conjunctivae and EOM are normal. Pupils are equal, round, and reactive to light.  Neck: Normal range of motion. Neck supple.  Cardiovascular: Normal rate and regular rhythm.  Pulmonary/Chest: Effort normal and breath sounds normal.  Abd:  Soft, non-distended, + BS, but mild low mid abd tedner, no guarding or rebound Neurological: Pt is alert. Skin: Skin is warm. No erythema.  Psychiatric: Pt behavior is normal. Thought content normal.     Assessment & Plan:

## 2012-02-14 NOTE — Assessment & Plan Note (Signed)
stable overall by hx and exam, most recent data reviewed with pt, and pt to continue medical treatment as before Lab Results  Component Value Date   TSH 2.66 04/26/2011

## 2012-02-14 NOTE — Assessment & Plan Note (Signed)
stable overall by hx and exam, most recent data reviewed with pt, and pt to continue medical treatment as before Lab Results  Component Value Date   WBC 5.8 04/26/2011   HGB 12.2 04/26/2011   HCT 37.3 04/26/2011   PLT 212.0 04/26/2011   GLUCOSE 102* 04/26/2011   CHOL 162 04/26/2011   TRIG 45.0 04/26/2011   HDL 74.40 04/26/2011   LDLCALC 79 04/26/2011   ALT 15 04/26/2011   AST 22 04/26/2011   NA 139 04/26/2011   K 3.6 04/26/2011   CL 105 04/26/2011   CREATININE 0.7 04/26/2011   BUN 16 04/26/2011   CO2 27 04/26/2011   TSH 2.66 04/26/2011

## 2012-02-15 LAB — URINE CULTURE: Colony Count: >=100000

## 2012-02-18 ENCOUNTER — Encounter: Payer: Self-pay | Admitting: Internal Medicine

## 2012-05-16 ENCOUNTER — Ambulatory Visit: Payer: 59 | Admitting: Internal Medicine

## 2012-05-16 DIAGNOSIS — Z0289 Encounter for other administrative examinations: Secondary | ICD-10-CM

## 2012-08-23 ENCOUNTER — Other Ambulatory Visit: Payer: Self-pay | Admitting: Internal Medicine

## 2012-09-15 ENCOUNTER — Encounter: Payer: Self-pay | Admitting: Internal Medicine

## 2012-09-15 ENCOUNTER — Ambulatory Visit (INDEPENDENT_AMBULATORY_CARE_PROVIDER_SITE_OTHER): Payer: 59 | Admitting: Internal Medicine

## 2012-09-15 VITALS — BP 104/68 | HR 78 | Temp 98.4°F | Ht 68.0 in | Wt 168.0 lb

## 2012-09-15 DIAGNOSIS — J019 Acute sinusitis, unspecified: Secondary | ICD-10-CM

## 2012-09-15 DIAGNOSIS — N926 Irregular menstruation, unspecified: Secondary | ICD-10-CM | POA: Insufficient documentation

## 2012-09-15 DIAGNOSIS — E039 Hypothyroidism, unspecified: Secondary | ICD-10-CM

## 2012-09-15 MED ORDER — HYDROCODONE-HOMATROPINE 5-1.5 MG/5ML PO SYRP: 5.0000 mL | ORAL_SOLUTION | Freq: Four times a day (QID) | ORAL | Status: DC | PRN

## 2012-09-15 MED ORDER — AZITHROMYCIN 250 MG PO TABS: ORAL_TABLET | ORAL | Status: DC

## 2012-09-15 NOTE — Assessment & Plan Note (Signed)
For [facility],  to f/u any worsening symptoms or concerns

## 2012-09-15 NOTE — Progress Notes (Signed)
  Subjective:    Patient ID: [name], female    DOB: 03-10-1971, 42 y.o.   MRN: [id number]  HPI   Here with 2-3 days acute onset fever, facial pain, pressure, headache, general weakness and malaise, and greenish d/c, with mild ST but marked none cough worse at night and cannot sleep, but pt denies chest pain, wheezing, increased sob or doe, orthopnea, PND, increased LE swelling, palpitations, dizziness or syncope.  Pt denies new neurological symptoms such as new headache, or facial or extremity weakness or numbness  Denies hyper or hypo thyroid symptoms such as voice, skin or hair change.  Does also have irreg menses, some mood swings  - ? Peri-menopause.   Past Medical History  Diagnosis Date  . Abdominal pain, epigastric 06/19/2010  . DEPRESSION 05/02/2010  . HYPERTHYROIDISM 05/02/2010  . HYPOTHYROIDISM 05/02/2010  . INSOMNIA 05/02/2010  . NAUSEA 06/19/2010  . TRANSAMINASES, SERUM, ELEVATED 05/02/2010   Past Surgical History  Procedure Date  . S/p compound fx left femur s/p rod, now removed   . Appendectomy   . Cholecystectomy   . Tubal ligation     reports that she has never smoked. She does not have any smokeless tobacco history on file. She reports that she drinks alcohol. She reports that she does not use illicit drugs. family history includes Alcohol abuse in her father and mother; COPD in her others; Depression in her mother; Diabetes in her father and other; Heart disease in her father and other; Hyperlipidemia in her father and other; Hypertension in her father and other; and Stroke in her mother and other. Allergies  Allergen Reactions  . Sulfa Antibiotics     hives   Current Outpatient Prescriptions on File Prior to Visit  Medication Sig Dispense Refill  . levothyroxine (SYNTHROID, LEVOTHROID) 100 MCG tablet TAKE ONE TABLET BY MOUTH EVERY DAY  90 tablet  2   Review of Systems  Constitutional: Negative for unexpected weight change, or unusual diaphoresis  HENT: Negative  for tinnitus.   Eyes: Negative for photophobia and visual disturbance.  Respiratory: Negative for choking and stridor.   Gastrointestinal: Negative for vomiting and blood in stool.  Genitourinary: Negative for hematuria and decreased urine volume.  Musculoskeletal: Negative for acute joint swelling Skin: Negative for color change and wound.  Neurological: Negative for tremors and numbness other than noted  Psychiatric/Behavioral: Negative for decreased concentration or  hyperactivity.        Objective:   Physical Exam BP 104/68  Pulse 78  Temp 98.4 F (36.9 C) (Oral)  Ht 5\' 8"  (1.727 m)  Wt 168 lb (76.204 kg)  BMI 25.54 kg/m2  SpO2 99% VS noted, mild ill Constitutional: Pt appears well-developed and well-nourished.  HENT: Head: NCAT.  Right Ear: External ear normal.  Left Ear: External ear normal.  Bilat tm's with mild erythema.  Max sinus areas mild tender.  Pharynx with mild erythema Eyes: Conjunctivae and EOM are normal. Pupils are equal, round, and reactive to light.  Neck: Normal range of motion. Neck supple.  Cardiovascular: Normal rate and regular rhythm.   Pulmonary/Chest: Effort normal and breath sounds normal.  Neurological: Pt is alert. Not confused  Skin: Skin is warm. No erythema.  Psychiatric: Pt behavior is normal. Thought content normal.     Assessment & Plan:

## 2012-09-15 NOTE — Assessment & Plan Note (Signed)
stable overall by history and exam, recent data reviewed with pt, and pt to continue medical treatment as before,  to f/u any worsening symptoms or concerns Lab Results  Component Value Date   TSH 2.66 04/26/2011

## 2012-09-15 NOTE — Patient Instructions (Addendum)
Please take all new medication as prescribed Please continue all other medications as before, and refills have been done if requested. Please go to the LAB in the Basement (turn left off the elevator) for the tests to be done today You will be contacted by phone if any changes need to be made immediately.  Otherwise, you will receive a letter about your results with an explanation, but please check with MyChart first. Thank you for enrolling in MyChart. Please follow the instructions below to securely access your online medical record. MyChart allows you to send messages to your doctor, view your test results, renew your prescriptions, schedule appointments, and more.  How Do I Sign Up? 1. In your Internet browser, go to [email or web address] 2. Click on the New  User? link in the Sign In box.  3. Enter your MyChart Access Code exactly as it appears below. You will not need to use this code after you have completed the sign-up process. If you do not sign up before the expiration date, you must request a new code. MyChart Access Code: [email or web address] Expires: 10/15/2012  5:03 PM  4. Enter the last four digits of your Social Security Number (xxxx) and Date of Birth (mm/dd/yyyy) as indicated and click Next. You will be taken to the next sign-up page. 5. Create a MyChart ID. This will be your MyChart login ID and cannot be changed, so think of one that is secure and easy to remember. 6. Create a MyChart password. You can change your password at any time. 7. Enter your Password Reset Question and Answer and click Next. This can be used at a later time if you forget your password.  8. Select your communication preference, and if applicable enter your e-mail address. You will receive e-mail notification when new information is available in MyChart by choosing to receive e-mail notifications and filling in your e-mail. 9. Click Sign In. You can now view your medical record.   Additional  Information If you have questions, you can email [email or web address] or call [phone] to talk to our MyChart staff. Remember, MyChart is NOT to be used for urgent needs. For medical emergencies, dial 911.

## 2012-09-15 NOTE — Assessment & Plan Note (Signed)
Mild to mod, for antibx course,  to f/u any worsening symptoms or concerns 

## 2013-01-14 ENCOUNTER — Other Ambulatory Visit (INDEPENDENT_AMBULATORY_CARE_PROVIDER_SITE_OTHER): Payer: 59

## 2013-01-14 ENCOUNTER — Ambulatory Visit (INDEPENDENT_AMBULATORY_CARE_PROVIDER_SITE_OTHER): Payer: 59 | Admitting: Internal Medicine

## 2013-01-14 ENCOUNTER — Encounter: Payer: Self-pay | Admitting: Internal Medicine

## 2013-01-14 VITALS — BP 92/62 | HR 64 | Temp 98.1°F | Ht 68.0 in | Wt 170.2 lb

## 2013-01-14 DIAGNOSIS — M545 Low back pain, unspecified: Secondary | ICD-10-CM | POA: Insufficient documentation

## 2013-01-14 DIAGNOSIS — F329 Major depressive disorder, single episode, unspecified: Secondary | ICD-10-CM

## 2013-01-14 DIAGNOSIS — E039 Hypothyroidism, unspecified: Secondary | ICD-10-CM

## 2013-01-14 LAB — URINALYSIS, ROUTINE W REFLEX MICROSCOPIC
Bilirubin Urine: NEGATIVE
Hgb urine dipstick: NEGATIVE
Leukocytes, UA: NEGATIVE
Nitrite: NEGATIVE
Total Protein, Urine: NEGATIVE
Urobilinogen, UA: 0.2 (ref 0.0–1.0)

## 2013-01-14 MED ORDER — TRAMADOL HCL 50 MG PO TABS: 50.0000 mg | ORAL_TABLET | Freq: Four times a day (QID) | ORAL | Status: DC | PRN

## 2013-01-14 MED ORDER — TIZANIDINE HCL 4 MG PO TABS: 4.0000 mg | ORAL_TABLET | Freq: Four times a day (QID) | ORAL | Status: DC | PRN

## 2013-01-14 NOTE — Patient Instructions (Signed)
Please take all new medication as prescribed - the pain medication, and muscle relaxer as needed Please continue all other medications as before Your specimen will be sent for analysis; if there is blood in the urine, you will most likely need a CT scan to make sure about kidney stones

## 2013-01-14 NOTE — Assessment & Plan Note (Signed)
Lab Results  Component Value Date   TSH 2.66 04/26/2011   stable overall by history and exam, recent data reviewed with pt, and pt to continue medical treatment as before,  to f/u any worsening symptoms or concerns

## 2013-01-14 NOTE — Assessment & Plan Note (Signed)
prob msk strain, cant r/o uti or renal stone -for UA micro, empiric pain med, muscle relaxer prn

## 2013-01-14 NOTE — Progress Notes (Signed)
Subjective:    Patient ID: [name], female    DOB: 28-Mar-1971, 42 y.o.   MRN: [id number]  HPI  Here to f/u. Had recent left ear infection tx with antibx now resolved.  Pt c/o right LBP sharp, mild to mod, no bowel or bladder change, fever, wt loss,  worsening LE pain/numbness/weakness, gait change or falls.  Also felt some bloating and vague discomfort somewhat on the right, without worsening reflux, dysphagia, n/v, bowel change or blood. S/p ccx and appy.   Pt denies fever, wt loss, night sweats, loss of appetite, or other constitutional symptoms.  Pt denies new neurological symptoms such as new headache, or facial or extremity weakness or numbness. Denies worsening depressive symptoms, suicidal ideation, or panic; has ongoing anxiety. .Denies hyper or hypo thyroid symptoms such as voice, skin or hair change. Past Medical History  Diagnosis Date  . Abdominal pain, epigastric 06/19/2010  . DEPRESSION 05/02/2010  . HYPERTHYROIDISM 05/02/2010  . HYPOTHYROIDISM 05/02/2010  . INSOMNIA 05/02/2010  . NAUSEA 06/19/2010  . TRANSAMINASES, SERUM, ELEVATED 05/02/2010   Past Surgical History  Procedure Laterality Date  . S/p compound fx left femur s/p rod, now removed    . Appendectomy    . Cholecystectomy    . Tubal ligation      reports that she has never smoked. She does not have any smokeless tobacco history on file. She reports that  drinks alcohol. She reports that she does not use illicit drugs. family history includes Alcohol abuse in her father and mother; COPD in her others; Depression in her mother; Diabetes in her father and other; Heart disease in her father and other; Hyperlipidemia in her father and other; Hypertension in her father and other; and Stroke in her mother and other. Allergies  Allergen Reactions  . Sulfa Antibiotics     hives   Current Outpatient Prescriptions on File Prior to Visit  Medication Sig Dispense Refill  . levothyroxine (SYNTHROID, LEVOTHROID) 100 MCG  tablet TAKE ONE TABLET BY MOUTH EVERY DAY  90 tablet  2   No current facility-administered medications on file prior to visit.   Review of Systems  Constitutional: Negative for unexpected weight change, or unusual diaphoresis  HENT: Negative for tinnitus.   Eyes: Negative for photophobia and visual disturbance.  Respiratory: Negative for choking and stridor.   Gastrointestinal: Negative for vomiting and blood in stool.  Genitourinary: Negative for hematuria and decreased urine volume.  Musculoskeletal: Negative for acute joint swelling Skin: Negative for color change and wound.  Neurological: Negative for tremors and numbness other than noted  Psychiatric/Behavioral: Negative for decreased concentration or  hyperactivity.       Objective:   Physical Exam BP 92/62  Pulse 64  Temp(Src) 98.1 F (36.7 C) (Oral)  Ht 5\' 8"  (1.727 m)  Wt 170 lb 4 oz (77.225 kg)  BMI 25.89 kg/m2  SpO2 98% VS noted, not ill appearing Constitutional: Pt appears well-developed and well-nourished.  HENT: Head: NCAT.  Right Ear: External ear normal.  Left Ear: External ear normal.  Eyes: Conjunctivae and EOM are normal. Pupils are equal, round, and reactive to light.  Neck: Normal range of motion. Neck supple.  Cardiovascular: Normal rate and regular rhythm.   Pulmonary/Chest: Effort normal and breath sounds normal.  Abd:  Soft,, non-distended, + BS, mild diffuse tender right > left, o/w nonfocal Neurological: Pt is alert. Not confused , motor 5/5, dtr/gait intact Spine: nontender, has some right paraspinal spasm Skin: Skin is warm. No  erythema. No rash Psychiatric: Pt behavior is normal. Thought content normal.     Assessment & Plan:

## 2013-01-14 NOTE — Assessment & Plan Note (Signed)
stable overall by history and exam, recent data reviewed with pt, and pt to continue medical treatment as before,  to f/u any worsening symptoms or concerns Lab Results  Component Value Date   WBC 5.8 04/26/2011   HGB 12.2 04/26/2011   HCT 37.3 04/26/2011   PLT 212.0 04/26/2011   GLUCOSE 102* 04/26/2011   CHOL 162 04/26/2011   TRIG 45.0 04/26/2011   HDL 74.40 04/26/2011   LDLCALC 79 04/26/2011   ALT 15 04/26/2011   AST 22 04/26/2011   NA 139 04/26/2011   K 3.6 04/26/2011   CL 105 04/26/2011   CREATININE 0.7 04/26/2011   BUN 16 04/26/2011   CO2 27 04/26/2011   TSH 2.66 04/26/2011

## 2013-05-31 ENCOUNTER — Other Ambulatory Visit: Payer: Self-pay | Admitting: Internal Medicine

## 2013-06-01 MED ORDER — LEVOTHYROXINE SODIUM 100 MCG PO TABS: 100.0000 ug | ORAL_TABLET | Freq: Every day | ORAL | Status: DC

## 2013-06-01 NOTE — Addendum Note (Signed)
Addended by: Lyanne Co R on: 06/01/2013 09:09 AM   Modules accepted: Orders

## 2013-06-18 ENCOUNTER — Other Ambulatory Visit: Payer: Self-pay

## 2013-06-26 ENCOUNTER — Telehealth: Payer: Self-pay | Admitting: *Deleted

## 2013-06-26 ENCOUNTER — Other Ambulatory Visit: Payer: Self-pay | Admitting: *Deleted

## 2013-06-26 MED ORDER — LEVOTHYROXINE SODIUM 100 MCG PO TABS: 100.0000 ug | ORAL_TABLET | Freq: Every day | ORAL | Status: DC

## 2013-06-26 NOTE — Telephone Encounter (Signed)
Pt called states she has a productive raspy cough.  Please advise

## 2013-06-26 NOTE — Telephone Encounter (Signed)
Please consider sat clinic tomorrow

## 2013-06-26 NOTE — Telephone Encounter (Signed)
Called the patient informed to schedule with Sat[facility] Clinic.  The patient does not want appointment at this time.

## 2013-09-01 ENCOUNTER — Telehealth: Payer: Self-pay | Admitting: *Deleted

## 2013-09-01 DIAGNOSIS — Z0000 Encounter for general adult medical examination without abnormal findings: Secondary | ICD-10-CM

## 2013-09-01 NOTE — Telephone Encounter (Signed)
Message copied by Earnstine Regal on Tue Sep 01, 2013  1:55 PM ------      Message from: Sherral Hammers      Created: Tue Sep 01, 2013 12:23 PM      Regarding: LAB       April 16 physical ------

## 2013-09-01 NOTE — Telephone Encounter (Signed)
Labs entered.../lmb 

## 2013-11-24 ENCOUNTER — Other Ambulatory Visit (INDEPENDENT_AMBULATORY_CARE_PROVIDER_SITE_OTHER): Payer: 59

## 2013-11-24 DIAGNOSIS — Z0000 Encounter for general adult medical examination without abnormal findings: Secondary | ICD-10-CM

## 2013-11-24 LAB — URINALYSIS, ROUTINE W REFLEX MICROSCOPIC
Bilirubin Urine: NEGATIVE
Hgb urine dipstick: NEGATIVE
LEUKOCYTES UA: NEGATIVE
NITRITE: NEGATIVE
Total Protein, Urine: NEGATIVE
UROBILINOGEN UA: 0.2 (ref 0.0–1.0)
Urine Glucose: NEGATIVE
WBC UA: NONE SEEN (ref 0–?)
pH: 6 (ref 5.0–8.0)

## 2013-11-24 LAB — HEPATIC FUNCTION PANEL
ALBUMIN: 4.1 g/dL (ref 3.5–5.2)
ALT: 45 U/L — AB (ref 0–35)
AST: 35 U/L (ref 0–37)
Alkaline Phosphatase: 53 U/L (ref 39–117)
Bilirubin, Direct: 0.1 mg/dL (ref 0.0–0.3)
TOTAL PROTEIN: 7.8 g/dL (ref 6.0–8.3)
Total Bilirubin: 0.5 mg/dL (ref 0.3–1.2)

## 2013-11-24 LAB — LIPID PANEL
CHOLESTEROL: 177 mg/dL (ref 0–200)
HDL: 80.4 mg/dL (ref >39.00)
LDL CALC: 87 mg/dL (ref 0–99)
Total CHOL/HDL Ratio: 2
Triglycerides: 47 mg/dL (ref 0.0–149.0)
VLDL: 9.4 mg/dL (ref 0.0–40.0)

## 2013-11-24 LAB — CBC WITH DIFFERENTIAL/PLATELET
BASOS ABS: 0 10*3/uL (ref 0.0–0.1)
BASOS PCT: 0.3 % (ref 0.0–3.0)
EOS ABS: 0.1 10*3/uL (ref 0.0–0.7)
EOS PCT: 1.7 % (ref 0.0–5.0)
HEMATOCRIT: 38.7 % (ref 36.0–46.0)
HEMOGLOBIN: 13 g/dL (ref 12.0–15.0)
LYMPHS ABS: 2.2 10*3/uL (ref 0.7–4.0)
Lymphocytes Relative: 30.4 % (ref 12.0–46.0)
MCHC: 33.5 g/dL (ref 30.0–36.0)
MCV: 97 fl (ref 78.0–100.0)
MONO ABS: 0.4 10*3/uL (ref 0.1–1.0)
Monocytes Relative: 6.3 % (ref 3.0–12.0)
NEUTROS ABS: 4.4 10*3/uL (ref 1.4–7.7)
Neutrophils Relative %: 61.3 % (ref 43.0–77.0)
PLATELETS: 212 10*3/uL (ref 150.0–400.0)
RBC: 3.99 Mil/uL (ref 3.87–5.11)
RDW: 14.8 % — ABNORMAL HIGH (ref 11.5–14.6)
WBC: 7.1 10*3/uL (ref 4.5–10.5)

## 2013-11-24 LAB — BASIC METABOLIC PANEL
BUN: 16 mg/dL (ref 6–23)
CHLORIDE: 101 meq/L (ref 96–112)
CO2: 27 mEq/L (ref 19–32)
Calcium: 9.3 mg/dL (ref 8.4–10.5)
Creatinine, Ser: 0.7 mg/dL (ref 0.4–1.2)
GFR: 90.99 mL/min (ref >60.00)
GLUCOSE: 81 mg/dL (ref 70–99)
POTASSIUM: 4.1 meq/L (ref 3.5–5.1)
SODIUM: 135 meq/L (ref 135–145)

## 2013-11-24 LAB — TSH: TSH: 2.17 u[IU]/mL (ref 0.35–5.50)

## 2013-11-26 ENCOUNTER — Ambulatory Visit (INDEPENDENT_AMBULATORY_CARE_PROVIDER_SITE_OTHER): Payer: 59 | Admitting: Internal Medicine

## 2013-11-26 ENCOUNTER — Encounter: Payer: Self-pay | Admitting: Internal Medicine

## 2013-11-26 VITALS — BP 108/64 | HR 78 | Temp 98.1°F | Ht 68.0 in | Wt 179.5 lb

## 2013-11-26 DIAGNOSIS — Z0000 Encounter for general adult medical examination without abnormal findings: Secondary | ICD-10-CM

## 2013-11-26 DIAGNOSIS — Z23 Encounter for immunization: Secondary | ICD-10-CM

## 2013-11-26 NOTE — Assessment & Plan Note (Signed)

## 2013-11-26 NOTE — Patient Instructions (Addendum)
You had the Tdap (tetanus) shot today   Please continue all other medications as before, and refills have been done if requested. Please have the pharmacy call with any other refills you may need.  Please continue your efforts at being more active, low cholesterol diet, and weight control. You are otherwise up to date with prevention measures today.  You will be contacted regarding the referral for: mammogram at [facility] on [address]   Your lab work and EKG today were OK  Please return in 1 year for your yearly visit, or sooner if needed, with Lab testing done 3-5 days before

## 2013-11-26 NOTE — Progress Notes (Signed)
Subjective:    Patient ID: [name], female    DOB: 06-18-1971, 43 y.o.   MRN: [id number]  HPI Here for wellness and f/u;  Overall doing ok;  Pt denies CP, worsening SOB, DOE, wheezing, orthopnea, PND, worsening LE edema, palpitations, dizziness or syncope.  Pt denies neurological change such as new headache, facial or extremity weakness.  Pt denies polydipsia, polyuria, or low sugar symptoms. Pt states overall good compliance with treatment and medications, good tolerability, and has been trying to follow lower cholesterol diet.  Pt denies worsening depressive symptoms, suicidal ideation or panic. No fever, night sweats, wt loss, loss of appetite, or other constitutional symptoms.  Pt states good ability with ADL's, has low fall risk, home safety reviewed and adequate, no other significant changes in hearing or vision, and only occasionally active with exercise. Has overall gained 20 lbs last few yrs. Plans to try to be more active Past Medical History  Diagnosis Date  . Abdominal pain, epigastric 06/19/2010  . DEPRESSION 05/02/2010  . HYPERTHYROIDISM 05/02/2010  . HYPOTHYROIDISM 05/02/2010  . INSOMNIA 05/02/2010  . NAUSEA 06/19/2010  . TRANSAMINASES, SERUM, ELEVATED 05/02/2010   Past Surgical History  Procedure Laterality Date  . S/p compound fx left femur s/p rod, now removed    . Appendectomy    . Cholecystectomy    . Tubal ligation      reports that she has never smoked. She does not have any smokeless tobacco history on file. She reports that she drinks alcohol. She reports that she does not use illicit drugs. family history includes Alcohol abuse in her father and mother; COPD in her other and other; Depression in her mother; Diabetes in her father and other; Heart disease in her father and other; Hyperlipidemia in her father and other; Hypertension in her father and other; Stroke in her mother and other. Allergies  Allergen Reactions  . Sulfa Antibiotics     hives   Current  Outpatient Prescriptions on File Prior to Visit  Medication Sig Dispense Refill  . levothyroxine (SYNTHROID, LEVOTHROID) 100 MCG tablet Take 1 tablet (100 mcg total) by mouth daily before breakfast.  90 tablet  1  . tiZANidine (ZANAFLEX) 4 MG tablet Take 1 tablet (4 mg total) by mouth every 6 (six) hours as needed.  40 tablet  1  . traMADol (ULTRAM) 50 MG tablet Take 1 tablet (50 mg total) by mouth every 6 (six) hours as needed for pain.  60 tablet  1   No current facility-administered medications on file prior to visit.   Review of Systems Constitutional: Negative for diaphoresis, activity change, appetite change or unexpected weight change.  HENT: Negative for hearing loss, ear pain, facial swelling, mouth sores and neck stiffness.   Eyes: Negative for pain, redness and visual disturbance.  Respiratory: Negative for shortness of breath and wheezing.   Cardiovascular: Negative for chest pain and palpitations.  Gastrointestinal: Negative for diarrhea, blood in stool, abdominal distention or other pain Genitourinary: Negative for hematuria, flank pain or change in urine volume.  Musculoskeletal: Negative for myalgias and joint swelling.  Skin: Negative for color change and wound.  Neurological: Negative for syncope and numbness. other than noted Hematological: Negative for adenopathy.  Psychiatric/Behavioral: Negative for hallucinations, self-injury, decreased concentration and agitation.      Objective:   Physical Exam .BP 108/64  Pulse 78  Temp(Src) 98.1 F (36.7 C) (Oral)  Ht 5\' 8"  (1.727 m)  Wt 179 lb 8 oz (81.421 kg)  BMI 27.30 kg/m2  SpO2 98% VS noted,  Constitutional: Pt is oriented to person, place, and time. Appears well-developed and well-nourished.  Head: Normocephalic and atraumatic.  Right Ear: External ear normal.  Left Ear: External ear normal.  Nose: Nose normal.  Mouth/Throat: Oropharynx is clear and moist.  Eyes: Conjunctivae and EOM are normal. Pupils are  equal, round, and reactive to light.  Neck: Normal range of motion. Neck supple. No JVD present. No tracheal deviation present.  Cardiovascular: Normal rate, regular rhythm, normal heart sounds and intact distal pulses.   Pulmonary/Chest: Effort normal and breath sounds normal.  Abdominal: Soft. Bowel sounds are normal. There is no tenderness. No HSM  Musculoskeletal: Normal range of motion. Exhibits no edema.  Lymphadenopathy:  Has no cervical adenopathy.  Neurological: Pt is alert and oriented to person, place, and time. Pt has normal reflexes. No cranial nerve deficit.  Skin: Skin is warm and dry. No rash noted.  Psychiatric:  Has  normal mood and affect. Behavior is normal.     Assessment & Plan:

## 2013-11-26 NOTE — Progress Notes (Signed)
Pre visit review using our clinic review tool, if applicable. No additional management support is needed unless otherwise documented below in the visit note. 

## 2013-11-26 NOTE — Addendum Note (Signed)
Addended by: Sharon Seller B on: 11/26/2013 02:34 PM   Modules accepted: Orders

## 2014-04-01 ENCOUNTER — Ambulatory Visit (INDEPENDENT_AMBULATORY_CARE_PROVIDER_SITE_OTHER): Payer: 59 | Admitting: Internal Medicine

## 2014-04-01 ENCOUNTER — Other Ambulatory Visit: Payer: Self-pay

## 2014-04-01 MED ORDER — LEVOTHYROXINE SODIUM 100 MCG PO TABS: 100.0000 ug | ORAL_TABLET | Freq: Every day | ORAL | Status: DC

## 2014-04-02 ENCOUNTER — Other Ambulatory Visit: Payer: 59

## 2014-04-02 ENCOUNTER — Encounter: Payer: Self-pay | Admitting: Internal Medicine

## 2014-04-02 ENCOUNTER — Ambulatory Visit (INDEPENDENT_AMBULATORY_CARE_PROVIDER_SITE_OTHER): Payer: 59 | Admitting: Internal Medicine

## 2014-04-02 VITALS — BP 110/70 | HR 70 | Temp 99.0°F | Wt 181.0 lb

## 2014-04-02 DIAGNOSIS — Z0000 Encounter for general adult medical examination without abnormal findings: Secondary | ICD-10-CM

## 2014-04-02 DIAGNOSIS — G4700 Insomnia, unspecified: Secondary | ICD-10-CM

## 2014-04-02 DIAGNOSIS — R109 Unspecified abdominal pain: Secondary | ICD-10-CM

## 2014-04-02 DIAGNOSIS — M549 Dorsalgia, unspecified: Secondary | ICD-10-CM

## 2014-04-02 DIAGNOSIS — R197 Diarrhea, unspecified: Secondary | ICD-10-CM

## 2014-04-02 LAB — POCT URINALYSIS DIPSTICK
Bilirubin, UA: NEGATIVE
Blood, UA: NEGATIVE
Glucose, UA: NEGATIVE
KETONES UA: NEGATIVE
LEUKOCYTES UA: NEGATIVE
NITRITE UA: NEGATIVE
PH UA: 6
PROTEIN UA: 0.15
Spec Grav, UA: >=1.03
UROBILINOGEN UA: 2

## 2014-04-02 MED ORDER — CEPHALEXIN 500 MG PO CAPS: 500.0000 mg | ORAL_CAPSULE | Freq: Four times a day (QID) | ORAL | Status: DC

## 2014-04-02 MED ORDER — ZOLPIDEM TARTRATE ER 12.5 MG PO TBCR: 12.5000 mg | EXTENDED_RELEASE_TABLET | Freq: Every evening | ORAL | Status: DC | PRN

## 2014-04-02 NOTE — Progress Notes (Signed)
Pre visit review using our clinic review tool, if applicable. No additional management support is needed unless otherwise documented below in the visit note. 

## 2014-04-02 NOTE — Patient Instructions (Signed)
Please take all new medication as prescribed - the antibiotic, as well as the ambien CR for sleep  Your urine specimen will be sent for culture  You will be contacted regarding the referral for: Gastroenterology  You can also use OTC immodium for loose stools  Please continue all other medications as before, and refills have been done if requested.  Please have the pharmacy call with any other refills you may need.  Please keep your appointments with your specialists as you may have planned

## 2014-04-02 NOTE — Progress Notes (Signed)
Subjective:    Patient ID: [name], female    DOB: [birth year], 43 y.o.   MRN: [id number]  HPI  Here to f/u with several complaints; Denies worsening depressive symptoms, suicidal ideation, or panic; has ongoing anxiety, denies worsening though has multiple ongoing stressors.  Has gained wt close to 30 lbs in past 2 yrs, only 10 since June 2014. Now with upper abd bilat tender at costal margins.Works as Tax inspector, hard work, not overly stressfull, but did have migraine yesterday.   C/o fatigue, and Cant stay asleep after 4 hrs, despite unisom, ongoing now for 1 mo, now with worsening memory, concentration.  Also with bouts of watery diarrhea , about qod, occas right side abd pain, occas nausea, no vomiting, no blood.   Denies hyper or hypo thyroid symptoms such as voice, skin or hair change. Recent TSH normal April 2015.  Has occas odor and dark color to urine, and today with low grade temp, and lower abd discomfort. UA dip in office neg.  S/p ccx Past Medical History  Diagnosis Date  . Abdominal pain, epigastric 06/19/2010  . DEPRESSION 05/02/2010  . HYPERTHYROIDISM 05/02/2010  . HYPOTHYROIDISM 05/02/2010  . INSOMNIA 05/02/2010  . NAUSEA 06/19/2010  . TRANSAMINASES, SERUM, ELEVATED 05/02/2010   Past Surgical History  Procedure Laterality Date  . S/p compound fx left femur s/p rod, now removed    . Appendectomy    . Cholecystectomy    . Tubal ligation      reports that she has never smoked. She does not have any smokeless tobacco history on file. She reports that she drinks alcohol. She reports that she does not use illicit drugs. family history includes Alcohol abuse in her father and mother; COPD in her other and other; Depression in her mother; Diabetes in her father and other; Heart disease in her father and other; Hyperlipidemia in her father and other; Hypertension in her father and other; Stroke in her mother and other. Allergies  Allergen Reactions  . Sulfa Antibiotics      hives   Current Outpatient Prescriptions on File Prior to Visit  Medication Sig Dispense Refill  . levothyroxine (SYNTHROID, LEVOTHROID) 100 MCG tablet Take 1 tablet (100 mcg total) by mouth daily before breakfast.  90 tablet  3  . tiZANidine (ZANAFLEX) 4 MG tablet Take 1 tablet (4 mg total) by mouth every 6 (six) hours as needed.  40 tablet  1  . traMADol (ULTRAM) 50 MG tablet Take 1 tablet (50 mg total) by mouth every 6 (six) hours as needed for pain.  60 tablet  1   No current facility-administered medications on file prior to visit.      Review of Systems  Constitutional: Negative for unusual diaphoresis or other sweats  HENT: Negative for ringing in ear Eyes: Negative for double vision or worsening visual disturbance.  Respiratory: Negative for choking and stridor.   Gastrointestinal: Negative for vomiting or other signifcant bowel change Genitourinary: Negative for hematuria or decreased urine volume.  Musculoskeletal: Negative for other MSK pain or swelling Skin: Negative for color change and worsening wound.  Neurological: Negative for tremors and numbness other than noted  Psychiatric/Behavioral: Negative for decreased concentration or agitation other than above       Objective:   Physical Exam BP 110/70  Pulse 70  Temp(Src) 99 F (37.2 C) (Oral)  Wt 181 lb (82.101 kg)  SpO2 98% VS noted,  Constitutional: Pt appears well-developed, well-nourished.  HENT: Head: NCAT.  Right Ear: External ear normal.  Left Ear: External ear normal.  Eyes: . Pupils are equal, round, and reactive to light. Conjunctivae and EOM are normal Neck: Normal range of motion. Neck supple.  Cardiovascular: Normal rate and regular rhythm.   Pulmonary/Chest: Effort normal and breath sounds normal.  Abd:  Soft, ND, + BS with mild tender low mid tender, no guarding or rebund Neurological: Pt is alert. Not confused , motor grossly intact Skin: Skin is warm. No rash Psychiatric: Pt behavior is  normal. No agitation.      Assessment & Plan:   Wt Readings from Last 3 Encounters:  04/02/14 181 lb (82.101 kg)  11/26/13 179 lb 8 oz (81.421 kg)  01/14/13 170 lb 4 oz (77.225 kg)

## 2014-04-04 DIAGNOSIS — R197 Diarrhea, unspecified: Secondary | ICD-10-CM | POA: Insufficient documentation

## 2014-04-04 NOTE — Assessment & Plan Note (Signed)
?   IBS vs other- for GI referral per pt request

## 2014-04-04 NOTE — Assessment & Plan Note (Signed)
prob UTI, for urine cx, empiric antibx,  to f/u any worsening symptoms or concerns

## 2014-04-04 NOTE — Assessment & Plan Note (Signed)
Mild persistent, hard to stay asleep, for ambien cr qhs prn,  to f/u any worsening symptoms or concerns

## 2014-04-05 LAB — URINE CULTURE: Colony Count: 80000

## 2014-04-09 ENCOUNTER — Telehealth: Payer: Self-pay

## 2014-04-09 NOTE — Telephone Encounter (Signed)
Will initiate PA 

## 2014-04-09 NOTE — Telephone Encounter (Signed)
Received PA for Zolpidem CR  12.5 mg tablet.  Pharmacy states if ok to change to Zolpidem

## 2014-04-09 NOTE — Telephone Encounter (Signed)
[facility] for PA , as pt stated last visit the 10 mg was not effective for staying asleep

## 2014-04-15 ENCOUNTER — Ambulatory Visit
Admission: RE | Admit: 2014-04-15 | Discharge: 2014-04-15 | Disposition: A | Discharge status: Home / Self Care | Payer: 59 | Source: Ambulatory Visit | Attending: Internal Medicine | Admitting: Internal Medicine

## 2014-04-15 DIAGNOSIS — Z0000 Encounter for general adult medical examination without abnormal findings: Secondary | ICD-10-CM

## 2014-04-27 ENCOUNTER — Telehealth: Payer: Self-pay | Admitting: *Deleted

## 2014-04-27 NOTE — Telephone Encounter (Signed)
Left msg on triage requesting call back concerning her mammogram. Called pt back no answer LMOM RTC...[name]

## 2014-04-28 NOTE — Telephone Encounter (Signed)
Called pt again still no answer LMOM RTC.../lmb 

## 2014-05-10 ENCOUNTER — Encounter: Payer: Self-pay | Admitting: Internal Medicine

## 2014-07-15 ENCOUNTER — Telehealth: Payer: Self-pay

## 2014-07-15 NOTE — Telephone Encounter (Addendum)
Prior authorization was completed via cover my meds. Received an immediate message back from patient's insurance stating patient was not found.

## 2014-07-15 NOTE — Telephone Encounter (Signed)
A user error has taken place: encounter opened in error, closed for administrative reasons.

## 2014-09-14 ENCOUNTER — Ambulatory Visit (INDEPENDENT_AMBULATORY_CARE_PROVIDER_SITE_OTHER): Payer: 59 | Admitting: Internal Medicine

## 2014-09-14 ENCOUNTER — Encounter: Payer: Self-pay | Admitting: Internal Medicine

## 2014-09-14 VITALS — BP 108/72 | HR 76 | Temp 97.9°F | Ht 68.0 in | Wt 190.2 lb

## 2014-09-14 DIAGNOSIS — M25562 Pain in left knee: Secondary | ICD-10-CM | POA: Insufficient documentation

## 2014-09-14 DIAGNOSIS — M25522 Pain in left elbow: Secondary | ICD-10-CM | POA: Insufficient documentation

## 2014-09-14 MED ORDER — KETOROLAC TROMETHAMINE 30 MG/ML IJ SOLN
30.0000 mg | Freq: Once | INTRAMUSCULAR | Status: AC
  Administered 2014-09-14: 30 mg via INTRAMUSCULAR

## 2014-09-14 MED ORDER — TRAMADOL HCL 50 MG PO TABS: 50.0000 mg | ORAL_TABLET | Freq: Four times a day (QID) | ORAL | Status: DC | PRN

## 2014-09-14 NOTE — Progress Notes (Signed)
Subjective:    Patient ID: [name], female    DOB: Jan 08, 1971, 44 y.o.   MRN: [id number]  HPI    Here with several complaints, worst is left elbow marked pain and swelling in the past wk, now severe, worse to flex the wrist/hand and doing her work as a Merchant [employer] officer.  Also with numerous requirement for squatting, now with anterior and medial knee pain, with sharp and dull pain, severe at times assoc with swelling/effusion, but no giveaways or falls. Pt denies chest pain, increased sob or doe, wheezing, orthopnea, PND, increased LE swelling, palpitations, dizziness or syncope. Past Medical History  Diagnosis Date  . Abdominal pain, epigastric 06/19/2010  . DEPRESSION 05/02/2010  . HYPERTHYROIDISM 05/02/2010  . HYPOTHYROIDISM 05/02/2010  . INSOMNIA 05/02/2010  . NAUSEA 06/19/2010  . TRANSAMINASES, SERUM, ELEVATED 05/02/2010   Past Surgical History  Procedure Laterality Date  . S/p compound fx left femur s/p rod, now removed    . Appendectomy    . Cholecystectomy    . Tubal ligation      reports that she has never smoked. She does not have any smokeless tobacco history on file. She reports that she drinks alcohol. She reports that she does not use illicit drugs. family history includes Alcohol abuse in her father and mother; COPD in her other and other; Depression in her mother; Diabetes in her father and other; Heart disease in her father and other; Hyperlipidemia in her father and other; Hypertension in her father and other; Stroke in her mother and other. Allergies  Allergen Reactions  . Sulfa Antibiotics     hives   Current Outpatient Prescriptions on File Prior to Visit  Medication Sig Dispense Refill  . levothyroxine (SYNTHROID, LEVOTHROID) 100 MCG tablet Take 1 tablet (100 mcg total) by mouth daily before breakfast. 90 tablet 3   No current facility-administered medications on file prior to visit.    Review of Systems  Constitutional: Negative for unusual diaphoresis or other  sweats  HENT: Negative for ringing in ear Eyes: Negative for double vision or worsening visual disturbance.  Respiratory: Negative for choking and stridor.   Gastrointestinal: Negative for vomiting or other signifcant bowel change Genitourinary: Negative for hematuria or decreased urine volume.  Musculoskeletal: Negative for other MSK pain or swelling Skin: Negative for color change and worsening wound.  Neurological: Negative for tremors and numbness other than noted  Psychiatric/Behavioral: Negative for decreased concentration or agitation other than above       Objective:   Physical Exam BP 108/72 mmHg  Pulse 76  Temp(Src) 97.9 F (36.6 C) (Oral)  Ht 5\' 8"  (1.727 m)  Wt 190 lb 4 oz (86.297 kg)  BMI 28.93 kg/m2  SpO2 97% VS noted, non toxic Constitutional: Pt appears well-developed, well-nourished.  HENT: Head: NCAT.  Right Ear: External ear normal.  Left Ear: External ear normal.  Eyes: . Pupils are equal, round, and reactive to light. Conjunctivae and EOM are normal Neck: Normal range of motion. Neck supple.  Cardiovascular: Normal rate and regular rhythm.   Pulmonary/Chest: Effort normal and breath sounds without rales or wheezing.  Left lateral epicondylar area with marked nondiscrete swelling/tender without joint effusion, erythema, drainage or skin change, pain worse to flex the wrist o/w neurovasc intact Left knee with decreased ROM, mod tender medial with 1+ effusion Neurological: Pt is alert. Not confused , motor grossly intact Skin: Skin is warm. No rash Psychiatric: Pt behavior is normal. No agitation.     Assessment &  Plan:

## 2014-09-14 NOTE — Progress Notes (Signed)
Pre visit review using our clinic review tool, if applicable. No additional management support is needed unless otherwise documented below in the visit note. 

## 2014-09-14 NOTE — Assessment & Plan Note (Signed)
C/w lateral epicondylitis, for pain control med, refer Dr SMith/sport med -likely for steroid shot, also for toradol IM today, also to avoid overuse further at work, also for left arm band

## 2014-09-14 NOTE — Patient Instructions (Signed)
You had the pain shot today (toradol)  Please take all new medication as prescribed - the pain medication  Please continue all other medications as before, and refills have been done if requested.  Please have the pharmacy call with any other refills you may need.  Please keep your appointments with your specialists as you may have planned  You will be contacted regarding the referral for: Dr Smith/Sports Medicine (asap)

## 2014-09-14 NOTE — Assessment & Plan Note (Signed)
?   patelofemoral vs djd vs other - also for sport med referral, pain control

## 2014-09-22 ENCOUNTER — Encounter: Payer: Self-pay | Admitting: *Deleted

## 2014-09-22 ENCOUNTER — Encounter: Payer: Self-pay | Admitting: Family Medicine

## 2014-09-22 ENCOUNTER — Ambulatory Visit (INDEPENDENT_AMBULATORY_CARE_PROVIDER_SITE_OTHER)
Admission: RE | Admit: 2014-09-22 | Discharge: 2014-09-22 | Disposition: A | Discharge status: Home / Self Care | Payer: 59 | Source: Ambulatory Visit | Attending: Family Medicine | Admitting: Family Medicine

## 2014-09-22 ENCOUNTER — Ambulatory Visit (INDEPENDENT_AMBULATORY_CARE_PROVIDER_SITE_OTHER): Payer: 59 | Admitting: Family Medicine

## 2014-09-22 ENCOUNTER — Other Ambulatory Visit (INDEPENDENT_AMBULATORY_CARE_PROVIDER_SITE_OTHER): Payer: 59

## 2014-09-22 VITALS — BP 108/78 | HR 71 | Ht 68.0 in | Wt 188.0 lb

## 2014-09-22 DIAGNOSIS — S83249A Other tear of medial meniscus, current injury, unspecified knee, initial encounter: Secondary | ICD-10-CM | POA: Insufficient documentation

## 2014-09-22 DIAGNOSIS — M25562 Pain in left knee: Secondary | ICD-10-CM

## 2014-09-22 DIAGNOSIS — S83242A Other tear of medial meniscus, current injury, left knee, initial encounter: Secondary | ICD-10-CM

## 2014-09-22 DIAGNOSIS — M778 Other enthesopathies, not elsewhere classified: Secondary | ICD-10-CM | POA: Insufficient documentation

## 2014-09-22 DIAGNOSIS — M25522 Pain in left elbow: Secondary | ICD-10-CM

## 2014-09-22 DIAGNOSIS — S83241A Other tear of medial meniscus, current injury, right knee, initial encounter: Secondary | ICD-10-CM

## 2014-09-22 DIAGNOSIS — M779 Enthesopathy, unspecified: Secondary | ICD-10-CM

## 2014-09-22 MED ORDER — MELOXICAM 15 MG PO TABS: 15.0000 mg | ORAL_TABLET | Freq: Every day | ORAL | Status: DC

## 2014-09-22 NOTE — Patient Instructions (Addendum)
Good to see you.  Ice 20 minutes 2 times daily. Usually after activity and before bed. Exercises 3 times a week.  Meloxicam daily for 10 days then as needed Vitamin D 2000 IU daily See me again in 2 weeks.

## 2014-09-22 NOTE — Assessment & Plan Note (Signed)
Patient given home exercises, icing protocol and we discussed compression sleeve. I think this is mild overall and probably compensating because patient does not walk as much. With patient he is on seated work only for the next 2 weeks having patient will do well. Patient come back at that time and we'll see how patient responds.

## 2014-09-22 NOTE — Progress Notes (Signed)
[name] [place], [place] [address] Phone: [phone] Subjective:    I'm seeing this patient by the request  of:  Cathlean Cower, MD   CC: Left knee pain left elbow pain  [id number] [name] is a 44 y.o. female coming in with complaint of knee pain. Left medial knee pain. She was seen by primary care provider and there was a question for patellofemoral syndrome. Patient was sent here for further evaluation. Patient denies the pain is more of a sharp and dull pain that can be severe at times. Patient states that sometimes it feels like it is going to give out on her. Patient discusses it as more of a dull throbbing aching pain. Patient rates the severity is 8 out of 10. Patient states that it can be very uncomfortable even at night. Patient does do a lot of squatting at work.  Patient is also having left elbow pain. States it is mostly on the posterior aspect of the elbow. Hurts worse with certain activities. Patient states that she actually tries to flex or hold a basket for long amount of time he can be uncomfortable. Denies any numbness or tingling. Denies any radiation down the arm. Seems to be localized. Patient rates the severity is 4 out of 10.     Past medical history, social, surgical and family history all reviewed in electronic medical record.   Review of Systems: No headache, visual changes, nausea, vomiting, diarrhea, constipation, dizziness, abdominal pain, skin rash, fevers, chills, night sweats, weight loss, swollen lymph nodes, body aches, joint swelling, muscle aches, chest pain, shortness of breath, mood changes.   Objective Blood pressure 108/78, pulse 71, height 5\' 8"  (1.727 m), weight 188 lb (85.276 kg), SpO2 97 %.  General: No apparent distress alert and oriented x3 mood and affect normal, dressed appropriately.  HEENT: Pupils equal, extraocular movements intact  Respiratory: Patient's speak in full sentences and  does not appear short of breath  Cardiovascular: No lower extremity edema, non tender, no erythema  Skin: Warm dry intact with no signs of infection or rash on extremities or on axial skeleton.  Abdomen: Soft nontender  Neuro: Cranial nerves II through XII are intact, neurovascularly intact in all extremities with 2+ DTRs and 2+ pulses.  Lymph: No lymphadenopathy of posterior or anterior cervical chain or axillae bilaterally.  Gait normal with good balance and coordination.  MSK:  Non tender with full range of motion and good stability and symmetric strength and tone of shoulders,  wrist, hip, and ankles bilaterally.  Elbow: Left Unremarkable to inspection. Range of motion full pronation, supination, flexion, extension. Strength is full to all of the above directions Stable to varus, valgus stress. Negative moving valgus stress test. Minor tenderness at the insertion of the tricep. Patient also has pain first resisted extension. Ulnar nerve does not sublux. Negative cubital tunnel Tinel's. Knee: Left Normal to inspection with no erythema or effusion or obvious bony abnormalities. Positive medial and lateral joint line tenderness ROM full in flexion and extension and lower leg rotation. Ligaments with solid consistent endpoints including ACL, PCL, LCL, MCL. Positive Mcmurray's, Apley's, and Thessalonian tests. Mild painful patellar compression. Patellar glide with minimal crepitus. Patellar and quadriceps tendons unremarkable. Hamstring and quadriceps strength is normal.  Contralateral knee has some mild crepitus in his minorly tender to palpation over the medial joint line but negative meniscal signs.  MSK US performed of: Left knee This study was ordered, performed,  and interpreted by Charlann Boxer D.O.  Knee: All structures visualized. Posterior medial meniscus does have a tear that has almost 100% displacement noted. This is approximately 50% of the meniscus. Increasing Doppler flow  and hypoechoic changes noted. Anteromedial, anterolateral,  and posterolateral menisci unremarkable without tearing, fraying, effusion, or displacement. Patellar Tendon unremarkable on long and transverse views without effusion. No abnormality of prepatellar bursa. LCL and MCL unremarkable on long and transverse views. No abnormality of origin of medial or lateral head of the gastrocnemius.  IMPRESSION:  Large posterior medial meniscal tear  Procedure: Real-time Ultrasound Guided Injection of left knee Device: GE Logiq E  Ultrasound guided injection is preferred based studies that show increased duration, increased effect, greater accuracy, decreased procedural pain, increased response rate, and decreased cost with ultrasound guided versus blind injection.  Verbal informed consent obtained.  Time-out conducted.  Noted no overlying erythema, induration, or other signs of local infection.  Skin prepped in a sterile fashion.  Local anesthesia: Topical Ethyl chloride.  With sterile technique and under real time ultrasound guidance: With a 22-gauge 2 inch needle patient was injected with 4 cc of 0.5% Marcaine and 1 cc of Kenalog 40 mg/dL. This was from a superior lateral approach.  Completed without difficulty  Pain immediately resolved suggesting accurate placement of the medication.  Advised to call if fevers/chills, erythema, induration, drainage, or persistent bleeding.  Images permanently stored and available for review in the ultrasound unit.  Impression: Technically successful ultrasound guided injection.  Procedure note 59741; 15 minutes spent for Therapeutic exercises as stated in above notes.  This included exercises focusing on stretching, strengthening, with significant focus on eccentric aspects.   Proper technique shown and discussed handout in great detail with ATC.  All questions were discussed and answered.     Impression and Recommendations:     This case required medical  decision making of moderate complexity.

## 2014-09-22 NOTE — Assessment & Plan Note (Signed)
She does have a medial meniscal tear on the left leg. Patient elected to try an injection. Patient is given an icing protocol, and we discussed oral anti-inflammatory. Patient was put on light duty for the next 2 weeks and then come will come back and see me at that time. Due to the displacement there is a possibility that patient may not improve. Patient will get x-rays to rule out any other bony abnormality. Once again patient come back in 2 weeks for further evaluation and treatment.

## 2014-09-22 NOTE — Progress Notes (Signed)
Pre visit review using our clinic review tool, if applicable. No additional management support is needed unless otherwise documented below in the visit note. 

## 2014-09-29 ENCOUNTER — Telehealth: Payer: Self-pay | Admitting: Internal Medicine

## 2014-09-29 NOTE — Telephone Encounter (Signed)
Pt called in said that she is in a lot of pain with her knee.  She is request a call back from nurse.   Thanks Ria Comment!!

## 2014-09-29 NOTE — Telephone Encounter (Signed)
Spoke to pt & scheduled an appt 2.19.16 @ 11:30am.

## 2014-10-01 ENCOUNTER — Other Ambulatory Visit (INDEPENDENT_AMBULATORY_CARE_PROVIDER_SITE_OTHER): Payer: 59

## 2014-10-01 ENCOUNTER — Encounter: Payer: Self-pay | Admitting: Family Medicine

## 2014-10-01 ENCOUNTER — Ambulatory Visit (INDEPENDENT_AMBULATORY_CARE_PROVIDER_SITE_OTHER): Payer: 59 | Admitting: Family Medicine

## 2014-10-01 VITALS — BP 116/70 | HR 65 | Ht 68.0 in | Wt 188.0 lb

## 2014-10-01 DIAGNOSIS — M25562 Pain in left knee: Secondary | ICD-10-CM

## 2014-10-01 DIAGNOSIS — S83242D Other tear of medial meniscus, current injury, left knee, subsequent encounter: Secondary | ICD-10-CM

## 2014-10-01 NOTE — Assessment & Plan Note (Signed)
Patient did not respond to the injection 1 week ago. Patient is actually having worsening pain. There is a new finding on ultrasound shows a potential for bursitis but due to patient's pain to even light palpation and do feel that advance imaging is necessary. Patient states that there is some instability of this knee as well. Patient will have an MRI and come back and we will further evaluate. In the interim patient will be treated for possible pes anserine bursitis and was even a thigh compression sleeve which I think will be helpful. We discussed the icing regimen and given a trial of topical anti-inflammatories.  Spent  25 minutes with patient face-to-face and had greater than 50% of counseling including as described above in assessment and plan.

## 2014-10-01 NOTE — Progress Notes (Signed)
Pre visit review using our clinic review tool, if applicable. No additional management support is needed unless otherwise documented below in the visit note. 

## 2014-10-01 NOTE — Progress Notes (Signed)
  [facility] [address] [place], [place] [address] Phone: [phone] Subjective:      CC: Left knee pain  [id number] Jurney Belva Agee is a 44 y.o. female coming in with complaint of knee pain. Left medial knee pain. Patient states that the pain has gotten significantly worse even after the injection. Patient states that now it seems to hurt her even with daily activities such as straining her leg or turning. Patient denies any numbness or tingling. Denies any radiation down the leg. States that it has not had any swelling though. Patient rates the severity actually worse than what it was one week ago. Denies any fevers or chills.  Past medical history, social, surgical and family history all reviewed in electronic medical record.   Review of Systems: No headache, visual changes, nausea, vomiting, diarrhea, constipation, dizziness, abdominal pain, skin rash, fevers, chills, night sweats, weight loss, swollen lymph nodes, body aches, joint swelling, muscle aches, chest pain, shortness of breath, mood changes.   Objective Blood pressure 116/70, pulse 65, height 5\' 8"  (1.727 m), weight 188 lb (85.276 kg), last menstrual period 09/08/2014, SpO2 98 %.  General: No apparent distress alert and oriented x3 mood and affect normal, dressed appropriately.  HEENT: Pupils equal, extraocular movements intact  Respiratory: Patient's speak in full sentences and does not appear short of breath  Cardiovascular: No lower extremity edema, non tender, no erythema  Skin: Warm dry intact with no signs of infection or rash on extremities or on axial skeleton.  Abdomen: Soft nontender  Neuro: Cranial nerves II through XII are intact, neurovascularly intact in all extremities with 2+ DTRs and 2+ pulses.  Lymph: No lymphadenopathy of posterior or anterior cervical chain or axillae bilaterally.  Gait normal with good balance and coordination.  MSK:  Non tender with full range of  motion and good stability and symmetric strength and tone of shoulders,  wrist, hip, and ankles bilaterally.  Knee: Left Normal to inspection with no erythema or effusion or obvious bony abnormalities. Positive medial and lateral joint line tenderness is even tender to light palpation ROM full in flexion and extension and lower leg rotation. Ligaments with solid consistent endpoints including ACL, PCL, LCL, MCL. Positive Mcmurray's, Apley's, and Thessalonian tests. Mild painful patellar compression. Patellar glide with minimal crepitus. Patellar and quadriceps tendons unremarkable. Mild tightness of the left hamstring compared to the contralateral side. Contralateral knee has some mild crepitus in his minorly tender to palpation over the medial joint line but negative meniscal signs.  MSK US performed of: Left knee This study was ordered, performed, and interpreted by Charlann Boxer D.O.  Knee: All structures visualized. Posterior medial meniscus does have a tear  patient though no longer has displacement but tear is still there.. This is approximately 50% of the meniscus. Increasing Doppler flow and hypoechoic changes noted. Anteromedial, anterolateral,  and posterolateral menisci unremarkable without tearing, fraying, effusion, or displacement. Patellar Tendon unremarkable on long and transverse views without effusion. No abnormality of prepatellar bursa. LCL and MCL unremarkable on long and transverse views. No abnormality of origin of medial or lateral head of the gastrocnemius. Pes anserine area does have significant swelling that was not seen previously.  IMPRESSION:  Large posterior medial meniscal tear with improved displacement question Lopez anserine bursitis      Impression and Recommendations:     This case required medical decision making of moderate complexity.

## 2014-10-01 NOTE — Patient Instructions (Addendum)
Good to see you Ice when you need it still 2 times a day Try the pennsaid twice daily Add tylenol to the tramadol.  Try compression sleeve to thigh New exercises for the hamstring MRI is ordered we will then see you 1-2 days after and go over it together.

## 2014-10-04 ENCOUNTER — Telehealth: Payer: Self-pay | Admitting: Internal Medicine

## 2014-10-04 ENCOUNTER — Encounter: Payer: Self-pay | Admitting: *Deleted

## 2014-10-04 NOTE — Telephone Encounter (Signed)
Patient requesting faxed letter stating that she can stand and walk again- sent to her job. Fax # [phone] Attn Jorene Guest

## 2014-10-04 NOTE — Telephone Encounter (Signed)
Letter faxed.

## 2014-10-08 ENCOUNTER — Ambulatory Visit: Payer: 59 | Admitting: Family Medicine

## 2014-10-30 ENCOUNTER — Ambulatory Visit
Admission: RE | Admit: 2014-10-30 | Discharge: 2014-10-30 | Disposition: A | Discharge status: Home / Self Care | Payer: 59 | Source: Ambulatory Visit | Attending: Family Medicine | Admitting: Family Medicine

## 2014-10-30 DIAGNOSIS — M25562 Pain in left knee: Secondary | ICD-10-CM

## 2014-11-04 ENCOUNTER — Encounter: Payer: Self-pay | Admitting: Family Medicine

## 2014-11-04 ENCOUNTER — Ambulatory Visit (INDEPENDENT_AMBULATORY_CARE_PROVIDER_SITE_OTHER): Payer: 59 | Admitting: Family Medicine

## 2014-11-04 VITALS — BP 92/64 | HR 77 | Ht 68.0 in | Wt 190.0 lb

## 2014-11-04 DIAGNOSIS — S83242D Other tear of medial meniscus, current injury, left knee, subsequent encounter: Secondary | ICD-10-CM | POA: Diagnosis not present

## 2014-11-04 NOTE — Assessment & Plan Note (Signed)
Discussed with patient at this time. Patient's meniscal tear that was seen previously has been healed now for probably going on the last month. We discussed icing regimen and continuing the home exercises. We discussed the possibility of formal physical therapy. Patient declined. We also discussed the possibility of viscous supplementation with the mild areas of chondromalacia but patient think she will do well. Patient will follow-up in 4 weeks after increasing her activity. If any worsening symptoms we will readdress the physical therapy in the viscous supplementation.  Spent  25 minutes with patient face-to-face and had greater than 50% of counseling including as described above in assessment and plan.

## 2014-11-04 NOTE — Progress Notes (Signed)
Pre visit review using our clinic review tool, if applicable. No additional management support is needed unless otherwise documented below in the visit note. 

## 2014-11-04 NOTE — Patient Instructions (Signed)
Good to see you OK to go to town on the knee.  Exercises regularly Ice still after a lot of activity Good shoes will always be good.  See me again in 4 weeks if not perfect

## 2014-11-04 NOTE — Progress Notes (Signed)
  [name] [address] [place], [place] [address] Phone: [phone] Subjective:    CC: Left knee pain  [id number] [name] is a 44 y.o. female coming in with complaint of knee pain.patient was seen previously and had more of a meniscal tear. Patient was healing and patient's last ultrasound did show good interval healing. Patient will continue to have difficulty doing activities and wanted an MRI. Patient's MRI was reviewed by me today and showed patient had some mild chondromalacia but no meniscal injury.  Past medical history, social, surgical and family history all reviewed in electronic medical record.   Review of Systems: No headache, visual changes, nausea, vomiting, diarrhea, constipation, dizziness, abdominal pain, skin rash, fevers, chills, night sweats, weight loss, swollen lymph nodes, body aches, joint swelling, muscle aches, chest pain, shortness of breath, mood changes.   Objective Blood pressure 92/64, pulse 77, height 5\' 8"  (1.727 m), weight 190 lb (86.183 kg), SpO2 98 %.  General: No apparent distress alert and oriented x3 mood and affect normal, dressed appropriately.  HEENT: Pupils equal, extraocular movements intact  Respiratory: Patient's speak in full sentences and does not appear short of breath  Cardiovascular: No lower extremity edema, non tender, no erythema  Skin: Warm dry intact with no signs of infection or rash on extremities or on axial skeleton.  Abdomen: Soft nontender  Neuro: Cranial nerves II through XII are intact, neurovascularly intact in all extremities with 2+ DTRs and 2+ pulses.  Lymph: No lymphadenopathy of posterior or anterior cervical chain or axillae bilaterally.  Gait normal with good balance and coordination.  MSK:  Non tender with full range of motion and good stability and symmetric strength and tone of shoulders,  wrist, hip, and ankles bilaterally.  Knee: Left Normal to inspection with no  erythema or effusion or obvious bony abnormalities. Positive medial and lateral joint line tenderness is even tender to light palpation ROM full in flexion and extension and lower leg rotation. Ligaments with solid consistent endpoints including ACL, PCL, LCL, MCL. Mild positiveMcmurray's, Apley's, and Thessalonian tests. Mild painful patellar compression. Patellar glide with minimal crepitus. Patellar and quadriceps tendons unremarkable. Mild tightness of the left hamstring compared to the contralateral side. Contralateral knee has some mild crepitus in his minorly tender to palpation over the medial joint line but negative meniscal signs.      Impression and Recommendations:     This case required medical decision making of moderate complexity.

## 2014-12-01 ENCOUNTER — Encounter: Payer: 59 | Admitting: Internal Medicine

## 2015-03-03 ENCOUNTER — Ambulatory Visit (INDEPENDENT_AMBULATORY_CARE_PROVIDER_SITE_OTHER): Payer: 59 | Admitting: Internal Medicine

## 2015-03-03 ENCOUNTER — Encounter: Payer: Self-pay | Admitting: Internal Medicine

## 2015-03-03 ENCOUNTER — Other Ambulatory Visit (INDEPENDENT_AMBULATORY_CARE_PROVIDER_SITE_OTHER): Payer: 59

## 2015-03-03 VITALS — BP 102/64 | HR 63 | Temp 97.9°F | Ht 68.0 in | Wt 186.0 lb

## 2015-03-03 DIAGNOSIS — Z0000 Encounter for general adult medical examination without abnormal findings: Secondary | ICD-10-CM

## 2015-03-03 DIAGNOSIS — R1031 Right lower quadrant pain: Secondary | ICD-10-CM | POA: Diagnosis not present

## 2015-03-03 LAB — CBC WITH DIFFERENTIAL/PLATELET
Basophils Absolute: 0 10*3/uL (ref 0.0–0.1)
Basophils Relative: 0.4 % (ref 0.0–3.0)
EOS PCT: 2.1 % (ref 0.0–5.0)
Eosinophils Absolute: 0.1 10*3/uL (ref 0.0–0.7)
HCT: 41.9 % (ref 36.0–46.0)
Hemoglobin: 14 g/dL (ref 12.0–15.0)
LYMPHS ABS: 2.5 10*3/uL (ref 0.7–4.0)
Lymphocytes Relative: 36.8 % (ref 12.0–46.0)
MCHC: 33.4 g/dL (ref 30.0–36.0)
MCV: 96.7 fl (ref 78.0–100.0)
MONO ABS: 0.4 10*3/uL (ref 0.1–1.0)
MONOS PCT: 5.9 % (ref 3.0–12.0)
Neutro Abs: 3.7 10*3/uL (ref 1.4–7.7)
Neutrophils Relative %: 54.8 % (ref 43.0–77.0)
PLATELETS: 244 10*3/uL (ref 150.0–400.0)
RBC: 4.33 Mil/uL (ref 3.87–5.11)
RDW: 14.9 % (ref 11.5–15.5)
WBC: 6.7 10*3/uL (ref 4.0–10.5)

## 2015-03-03 LAB — URINALYSIS, ROUTINE W REFLEX MICROSCOPIC
Bilirubin Urine: NEGATIVE
HGB URINE DIPSTICK: NEGATIVE
Ketones, ur: NEGATIVE
LEUKOCYTES UA: NEGATIVE
Nitrite: NEGATIVE
RBC / HPF: NONE SEEN (ref 0–?)
SPECIFIC GRAVITY, URINE: 1.02 (ref 1.000–1.030)
Total Protein, Urine: NEGATIVE
UROBILINOGEN UA: 0.2 (ref 0.0–1.0)
Urine Glucose: NEGATIVE
pH: 6.5 (ref 5.0–8.0)

## 2015-03-03 LAB — BASIC METABOLIC PANEL
BUN: 14 mg/dL (ref 6–23)
CALCIUM: 9.4 mg/dL (ref 8.4–10.5)
CO2: 24 mEq/L (ref 19–32)
CREATININE: 0.72 mg/dL (ref 0.40–1.20)
Chloride: 104 mEq/L (ref 96–112)
GFR: 93.36 mL/min (ref >60.00)
Glucose, Bld: 92 mg/dL (ref 70–99)
Potassium: 4.1 mEq/L (ref 3.5–5.1)
SODIUM: 137 meq/L (ref 135–145)

## 2015-03-03 LAB — LIPID PANEL
Cholesterol: 164 mg/dL (ref 0–200)
HDL: 64.8 mg/dL (ref >39.00)
LDL CALC: 83 mg/dL (ref 0–99)
NONHDL: 99.2
TRIGLYCERIDES: 82 mg/dL (ref 0.0–149.0)
Total CHOL/HDL Ratio: 3
VLDL: 16.4 mg/dL (ref 0.0–40.0)

## 2015-03-03 LAB — HEPATIC FUNCTION PANEL
ALK PHOS: 62 U/L (ref 39–117)
ALT: 30 U/L (ref 0–35)
AST: 18 U/L (ref 0–37)
Albumin: 4.2 g/dL (ref 3.5–5.2)
Bilirubin, Direct: 0.1 mg/dL (ref 0.0–0.3)
TOTAL PROTEIN: 7.4 g/dL (ref 6.0–8.3)
Total Bilirubin: 0.7 mg/dL (ref 0.2–1.2)

## 2015-03-03 LAB — TSH: TSH: 1.31 u[IU]/mL (ref 0.35–4.50)

## 2015-03-03 MED ORDER — LEVOTHYROXINE SODIUM 100 MCG PO TABS: 100.0000 ug | ORAL_TABLET | Freq: Every day | ORAL | Status: DC

## 2015-03-03 NOTE — Patient Instructions (Signed)

## 2015-03-03 NOTE — Progress Notes (Signed)
Subjective:    Patient ID: [name], female    DOB: 10-27-1970, 44 y.o.   MRN: [id number]  HPI  Here for wellness and f/u;  Overall doing ok;  Pt denies Chest pain, worsening SOB, DOE, wheezing, orthopnea, PND, worsening LE edema, palpitations, dizziness or syncope.  Pt denies neurological change such as new headache, facial or extremity weakness.  Pt denies polydipsia, polyuria, or low sugar symptoms. Pt states overall good compliance with treatment and medications, good tolerability, and has been trying to follow appropriate diet.  Pt denies worsening depressive symptoms, suicidal ideation or panic. No fever, night sweats, wt loss, loss of appetite, or other constitutional symptoms.  Pt states good ability with ADL's, has low fall risk, home safety reviewed and adequate, no other significant changes in hearing or vision, and only occasionally active with exercise.   Wt Readings from Last 3 Encounters:  03/03/15 186 lb (84.369 kg)  11/04/14 190 lb (86.183 kg)  10/01/14 188 lb (85.276 kg)  Does have recurring 2 yrs right lower abd/pelvic pain, no problem per GYN per pt; s/p ccx and appy; she is wondering about crohns with intermittent diarrhea, and has read online about some assoc with thyroid issues.. Denies worsening reflux, other abd pain, dysphagia, n/v, bowel change or blood. Past Medical History  Diagnosis Date  . Abdominal pain, epigastric 06/19/2010  . DEPRESSION 05/02/2010  . HYPERTHYROIDISM 05/02/2010  . HYPOTHYROIDISM 05/02/2010  . INSOMNIA 05/02/2010  . NAUSEA 06/19/2010  . TRANSAMINASES, SERUM, ELEVATED 05/02/2010   Past Surgical History  Procedure Laterality Date  . S/p compound fx left femur s/p rod, now removed    . Appendectomy    . Cholecystectomy    . Tubal ligation      reports that she has never smoked. She does not have any smokeless tobacco history on file. She reports that she drinks alcohol. She reports that she does not use illicit drugs. family history  includes Alcohol abuse in her father and mother; COPD in her other and other; Depression in her mother; Diabetes in her father and other; Heart disease in her father and other; Hyperlipidemia in her father and other; Hypertension in her father and other; Stroke in her mother and other. Allergies  Allergen Reactions  . Sulfa Antibiotics     hives   Current Outpatient Prescriptions on File Prior to Visit  Medication Sig Dispense Refill  . meloxicam (MOBIC) 15 MG tablet Take 1 tablet (15 mg total) by mouth daily. (Patient not taking: Reported on 03/03/2015) 30 tablet 0  . traMADol (ULTRAM) 50 MG tablet Take 1 tablet (50 mg total) by mouth every 6 (six) hours as needed. (Patient not taking: Reported on 03/03/2015) 60 tablet 1   No current facility-administered medications on file prior to visit.     Review of Systems Constitutional: Negative for increased diaphoresis, other activity, appetite or siginficant weight change other than noted HENT: Negative for worsening hearing loss, ear pain, facial swelling, mouth sores and neck stiffness.   Eyes: Negative for other worsening pain, redness or visual disturbance.  Respiratory: Negative for shortness of breath and wheezing  Cardiovascular: Negative for chest pain and palpitations.  Gastrointestinal: Negative for diarrhea, blood in stool, abdominal distention or other pain Genitourinary: Negative for hematuria, flank pain or change in urine volume.  Musculoskeletal: Negative for myalgias or other joint complaints.  Skin: Negative for color change and wound or drainage.  Neurological: Negative for syncope and numbness. other than noted Hematological: Negative for  adenopathy. or other swelling Psychiatric/Behavioral: Negative for hallucinations, SI, self-injury, decreased concentration or other worsening agitation.      Objective:   Physical Exam BP 102/64 mmHg  Pulse 63  Temp(Src) 97.9 F (36.6 C) (Oral)  Ht 5\' 8"  (1.727 m)  Wt 186 lb  (84.369 kg)  BMI 28.29 kg/m2  SpO2 98%  LMP 02/13/2015 VS noted,  Constitutional: Pt is oriented to person, place, and time. Appears well-developed and well-nourished, in no significant distress Head: Normocephalic and atraumatic.  Right Ear: External ear normal.  Left Ear: External ear normal.  Nose: Nose normal.  Mouth/Throat: Oropharynx is clear and moist.  Eyes: Conjunctivae and EOM are normal. Pupils are equal, round, and reactive to light.  Neck: Normal range of motion. Neck supple. No JVD present. No tracheal deviation present or significant neck LA or mass Cardiovascular: Normal rate, regular rhythm, normal heart sounds and intact distal pulses.   Pulmonary/Chest: Effort normal and breath sounds without rales or wheezing  Abdominal: Soft. Bowel sounds are normal. No HSM  + tneder right lower quad without guarding/rebound Musculoskeletal: Normal range of motion. Exhibits no edema.  Lymphadenopathy:  Has no cervical adenopathy.  Neurological: Pt is alert and oriented to person, place, and time. Pt has normal reflexes. No cranial nerve deficit. Motor grossly intact Skin: Skin is warm and dry. No rash noted.  Psychiatric:  Has normal mood and affect. Behavior is normal.     Assessment & Plan:

## 2015-03-03 NOTE — Assessment & Plan Note (Signed)
Mild but persistent with chronic tender, I suggested GI referral but pt declines for now

## 2015-03-03 NOTE — Addendum Note (Signed)
Addended by: Biagio Borg on: 03/03/2015 01:21 PM   Modules accepted: Miquel Dunn

## 2015-03-03 NOTE — Progress Notes (Signed)
Pre visit review using our clinic review tool, if applicable. No additional management support is needed unless otherwise documented below in the visit note. 

## 2015-03-03 NOTE — Assessment & Plan Note (Addendum)

## 2015-06-07 ENCOUNTER — Ambulatory Visit (INDEPENDENT_AMBULATORY_CARE_PROVIDER_SITE_OTHER): Payer: 59 | Admitting: Internal Medicine

## 2015-06-07 ENCOUNTER — Encounter: Payer: Self-pay | Admitting: Internal Medicine

## 2015-06-07 VITALS — BP 112/66 | HR 75 | Temp 98.7°F | Resp 16 | Wt 191.0 lb

## 2015-06-07 DIAGNOSIS — M21611 Bunion of right foot: Secondary | ICD-10-CM

## 2015-06-07 DIAGNOSIS — Z23 Encounter for immunization: Secondary | ICD-10-CM

## 2015-06-07 DIAGNOSIS — R1031 Right lower quadrant pain: Secondary | ICD-10-CM | POA: Diagnosis not present

## 2015-06-07 DIAGNOSIS — M21612 Bunion of left foot: Secondary | ICD-10-CM

## 2015-06-07 DIAGNOSIS — R10A1 Flank pain, right side: Secondary | ICD-10-CM

## 2015-06-07 DIAGNOSIS — R109 Unspecified abdominal pain: Secondary | ICD-10-CM | POA: Diagnosis not present

## 2015-06-07 MED ORDER — METRONIDAZOLE 250 MG PO TABS: 250.0000 mg | ORAL_TABLET | Freq: Three times a day (TID) | ORAL | Status: DC

## 2015-06-07 MED ORDER — CEFTRIAXONE SODIUM 1 G IJ SOLR
1.0000 g | Freq: Once | INTRAMUSCULAR | Status: AC
  Administered 2015-06-07: 1 g via INTRAMUSCULAR

## 2015-06-07 MED ORDER — HYDROCODONE-ACETAMINOPHEN 5-325 MG PO TABS: 1.0000 | ORAL_TABLET | Freq: Four times a day (QID) | ORAL | Status: DC | PRN

## 2015-06-07 MED ORDER — CIPROFLOXACIN HCL 500 MG PO TABS: 500.0000 mg | ORAL_TABLET | Freq: Two times a day (BID) | ORAL | Status: DC

## 2015-06-07 NOTE — Patient Instructions (Addendum)
You had the flu shot today  You had the antibiotic shot today (rocephin)  Please take all new medication as prescribed - the two antibiotics and pain medication  Please return in the AM after 730 for LAB only  Then come to first floor, front desk to ask to see Stanton Kidney or [place] for the CT scheduling (after 8 am)  Please continue all other medications as before, and refills have been done if requested.  Please have the pharmacy call with any other refills you may need.  Please keep your appointments with your specialists as you may have planned

## 2015-06-07 NOTE — Assessment & Plan Note (Signed)
Etiology unclear, no fever but cant r/o UTI, pyelonephritis, diverticulitis, renal stone or other, for empiric rocephin IM , cipro/flagy, pain control med, and labs as documented including UA and CT abd/pelvis with CM  Note:  Total time for pt hx, exam, review of record with pt in the room, determination of diagnoses and plan for further eval and tx is > 40 min, with over 50% spent in coordination and counseling of patient

## 2015-06-07 NOTE — Progress Notes (Signed)
Pre visit review using our clinic review tool, if applicable. No additional management support is needed unless otherwise documented below in the visit note. 

## 2015-06-07 NOTE — Progress Notes (Signed)
   Subjective:    Patient ID: [name], female    DOB: [birth year], 44 y.o.   MRN: [id number]  HPI Here with c/o 3 mo ongoing but suddenly worse 2-3 days dull constant RLQ pain with radiation to the right lower back, assoc with urgency, urinary leakage unusual for her, but Denies urinary symptoms such as dysuria, frequency, hematuria or n/v, fever, chills.  Pt denies bowel change, fever, wt loss,  worsening LE pain/numbness/weakness, gait change or falls. Denies worsening reflux, dysphagia, n/v, new bowel change or blood.Has recurring diarrhea post ccx, no change S/p appy, s/p ccx.  No hx of renal stone or recent hematuria.  Nothing seems to make better or worse. Past Medical History  Diagnosis Date  . Abdominal pain, epigastric 06/19/2010  . DEPRESSION 05/02/2010  . HYPERTHYROIDISM 05/02/2010  . HYPOTHYROIDISM 05/02/2010  . INSOMNIA 05/02/2010  . NAUSEA 06/19/2010  . TRANSAMINASES, SERUM, ELEVATED 05/02/2010   Past Surgical History  Procedure Laterality Date  . S/p compound fx left femur s/p rod, now removed    . Appendectomy    . Cholecystectomy    . Tubal ligation      reports that she has never smoked. She does not have any smokeless tobacco history on file. She reports that she drinks alcohol. She reports that she does not use illicit drugs. family history includes Alcohol abuse in her father and mother; COPD in her other and other; Depression in her mother; Diabetes in her father and other; Heart disease in her father and other; Hyperlipidemia in her father and other; Hypertension in her father and other; Stroke in her mother and other. Allergies  Allergen Reactions  . Sulfa Antibiotics     hives   Current Outpatient Prescriptions on File Prior to Visit  Medication Sig Dispense Refill  . levothyroxine (SYNTHROID, LEVOTHROID) 100 MCG tablet Take 1 tablet (100 mcg total) by mouth daily before breakfast. 90 tablet 1   No current facility-administered medications on file prior to  visit.   Review of Systems  Constitutional: Negative for unusual diaphoresis or night sweats HENT: Negative for ringing in ear or discharge Eyes: Negative for double vision or worsening visual disturbance.  Respiratory: Negative for choking and stridor.   Gastrointestinal: Negative for vomiting or other signifcant bowel change Genitourinary: Negative for hematuria or change in urine volume.  Musculoskeletal: Negative for other MSK pain or swelling Skin: Negative for color change and worsening wound.  Neurological: Negative for tremors and numbness other than noted  Psychiatric/Behavioral: Negative for decreased concentration or agitation other than above       Objective:   Physical Exam BP 112/66 mmHg  Pulse 75  Temp(Src) 98.7 F (37.1 C) (Oral)  Resp 16  Wt 191 lb (86.637 kg)  SpO2 98% VS noted,  Constitutional: Pt appears in no significant distress HENT: Head: NCAT.  Right Ear: External ear normal.  Left Ear: External ear normal.  Eyes: . Pupils are equal, round, and reactive to light. Conjunctivae and EOM are normal Neck: Normal range of motion. Neck supple.  Cardiovascular: Normal rate and regular rhythm.   Pulmonary/Chest: Effort normal and breath sounds without rales or wheezing.  Abd:  Soft, ND, + BS with tender mod RLQ without guarding or rebound + mild right flank tender Neurological: Pt is alert. Not confused , motor grossly intact Skin: Skin is warm. No rash, no LE edema Psychiatric: Pt behavior is normal. No agitation.     Assessment & Plan:

## 2015-06-08 ENCOUNTER — Encounter: Payer: Self-pay | Admitting: Internal Medicine

## 2015-06-08 ENCOUNTER — Other Ambulatory Visit (INDEPENDENT_AMBULATORY_CARE_PROVIDER_SITE_OTHER): Payer: 59

## 2015-06-08 ENCOUNTER — Other Ambulatory Visit: Payer: Self-pay | Admitting: Internal Medicine

## 2015-06-08 ENCOUNTER — Ambulatory Visit (INDEPENDENT_AMBULATORY_CARE_PROVIDER_SITE_OTHER)
Admission: RE | Admit: 2015-06-08 | Discharge: 2015-06-08 | Disposition: A | Discharge status: Home / Self Care | Payer: 59 | Source: Ambulatory Visit | Attending: Internal Medicine | Admitting: Internal Medicine

## 2015-06-08 DIAGNOSIS — R109 Unspecified abdominal pain: Secondary | ICD-10-CM | POA: Diagnosis not present

## 2015-06-08 DIAGNOSIS — R1031 Right lower quadrant pain: Secondary | ICD-10-CM | POA: Diagnosis not present

## 2015-06-08 DIAGNOSIS — K6389 Other specified diseases of intestine: Secondary | ICD-10-CM

## 2015-06-08 DIAGNOSIS — R935 Abnormal findings on diagnostic imaging of other abdominal regions, including retroperitoneum: Secondary | ICD-10-CM

## 2015-06-08 LAB — CBC WITH DIFFERENTIAL/PLATELET
BASOS ABS: 0 10*3/uL (ref 0.0–0.1)
Basophils Relative: 0.4 % (ref 0.0–3.0)
Eosinophils Absolute: 0.2 10*3/uL (ref 0.0–0.7)
Eosinophils Relative: 2.8 % (ref 0.0–5.0)
HCT: 41.1 % (ref 36.0–46.0)
Hemoglobin: 13.6 g/dL (ref 12.0–15.0)
LYMPHS ABS: 1.5 10*3/uL (ref 0.7–4.0)
Lymphocytes Relative: 25.8 % (ref 12.0–46.0)
MCHC: 33 g/dL (ref 30.0–36.0)
MCV: 96.6 fl (ref 78.0–100.0)
Monocytes Absolute: 0.4 10*3/uL (ref 0.1–1.0)
Monocytes Relative: 7.4 % (ref 3.0–12.0)
NEUTROS PCT: 63.6 % (ref 43.0–77.0)
Neutro Abs: 3.8 10*3/uL (ref 1.4–7.7)
Platelets: 215 10*3/uL (ref 150.0–400.0)
RBC: 4.26 Mil/uL (ref 3.87–5.11)
RDW: 14.7 % (ref 11.5–15.5)
WBC: 5.9 10*3/uL (ref 4.0–10.5)

## 2015-06-08 LAB — URINALYSIS, ROUTINE W REFLEX MICROSCOPIC
Bilirubin Urine: NEGATIVE
Hgb urine dipstick: NEGATIVE
KETONES UR: NEGATIVE
Leukocytes, UA: NEGATIVE
Nitrite: NEGATIVE
PH: 7.5 (ref 5.0–8.0)
Specific Gravity, Urine: 1.02 (ref 1.000–1.030)
TOTAL PROTEIN, URINE-UPE24: NEGATIVE
UROBILINOGEN UA: 0.2 (ref 0.0–1.0)
Urine Glucose: NEGATIVE

## 2015-06-08 LAB — BASIC METABOLIC PANEL
BUN: 13 mg/dL (ref 6–23)
CALCIUM: 9.4 mg/dL (ref 8.4–10.5)
CO2: 27 meq/L (ref 19–32)
Chloride: 103 mEq/L (ref 96–112)
Creatinine, Ser: 0.72 mg/dL (ref 0.40–1.20)
GFR: 93.25 mL/min (ref >60.00)
Glucose, Bld: 96 mg/dL (ref 70–99)
Potassium: 4.2 mEq/L (ref 3.5–5.1)
SODIUM: 137 meq/L (ref 135–145)

## 2015-06-08 LAB — HEPATIC FUNCTION PANEL
ALBUMIN: 3.9 g/dL (ref 3.5–5.2)
ALK PHOS: 62 U/L (ref 39–117)
ALT: 54 U/L — ABNORMAL HIGH (ref 0–35)
AST: 35 U/L (ref 0–37)
Bilirubin, Direct: 0.1 mg/dL (ref 0.0–0.3)
TOTAL PROTEIN: 7.3 g/dL (ref 6.0–8.3)
Total Bilirubin: 0.7 mg/dL (ref 0.2–1.2)

## 2015-06-08 LAB — LIPASE: Lipase: 14 U/L (ref 11.0–59.0)

## 2015-06-08 MED ORDER — IOHEXOL 300 MG/ML  SOLN: 100.0000 mL | Freq: Once | INTRAMUSCULAR | Status: DC | PRN

## 2015-06-08 MED ORDER — IOHEXOL 300 MG/ML  SOLN
100.0000 mL | Freq: Once | INTRAMUSCULAR | Status: AC | PRN
  Administered 2015-06-08: 100 mL via INTRAVENOUS

## 2015-06-09 ENCOUNTER — Telehealth: Payer: Self-pay | Admitting: *Deleted

## 2015-06-09 NOTE — Telephone Encounter (Signed)
Left msg on triage requesting CT results from yesterday. Called pt back inform her md has mailed out result letter this am, but did relay md response on letter...[name]

## 2015-06-10 ENCOUNTER — Encounter: Payer: Self-pay | Admitting: Physician Assistant

## 2015-06-21 ENCOUNTER — Encounter: Payer: Self-pay | Admitting: Physician Assistant

## 2015-06-21 ENCOUNTER — Ambulatory Visit (INDEPENDENT_AMBULATORY_CARE_PROVIDER_SITE_OTHER): Payer: 59 | Admitting: Physician Assistant

## 2015-06-21 ENCOUNTER — Other Ambulatory Visit: Payer: 59

## 2015-06-21 VITALS — BP 110/66 | HR 76 | Ht 68.5 in | Wt 189.1 lb

## 2015-06-21 DIAGNOSIS — R938 Abnormal findings on diagnostic imaging of other specified body structures: Secondary | ICD-10-CM | POA: Diagnosis not present

## 2015-06-21 DIAGNOSIS — D490 Neoplasm of unspecified behavior of digestive system: Secondary | ICD-10-CM

## 2015-06-21 DIAGNOSIS — R9389 Abnormal findings on diagnostic imaging of other specified body structures: Secondary | ICD-10-CM

## 2015-06-21 DIAGNOSIS — D379 Neoplasm of uncertain behavior of digestive organ, unspecified: Secondary | ICD-10-CM | POA: Diagnosis not present

## 2015-06-21 MED ORDER — NA SULFATE-K SULFATE-MG SULF 17.5-3.13-1.6 GM/177ML PO SOLN: 1.0000 | Freq: Once | ORAL | Status: DC

## 2015-06-21 NOTE — Patient Instructions (Addendum)
Please go to the basement level to our lab for a Urine Pregnancy test.   You have been scheduled for a colonoscopy. Please follow written instructions given to you at your visit today.  Please pick up your prep supplies at the pharmacy within the next 1-3 days. [name], [place], [place].  If you use inhalers (even only as needed), please bring them with you on the day of your procedure. Your physician has requested that you go to www.startemmi.com and enter the access code given to you at your visit today. This web site gives a general overview about your procedure. However, you should still follow specific instructions given to you by our office regarding your preparation for the procedure.

## 2015-06-21 NOTE — Progress Notes (Signed)
i agree with the above note, plan 

## 2015-06-21 NOTE — Progress Notes (Signed)
Patient ID: [name], female   DOB: 1971-06-27, 44 y.o.   MRN: [id number]    HPI:  [name] is a 44 y.o.   female referred by Biagio Borg, MD for evaluation of a cecal mass. [name] has been experiencing right lower quadrant abdominal pain for the past 1-1-1/2 years. Initially, it was intermittent and would come and go but over the past 8-9 months it has been fairly constant. It is not alleviated nor exacerbated with ingestion of food. It is not alleviated nor exacerbated with passage of gas or defecation. It is not associated with an obvious change in bowel movements. [name] states that since her cholecystectomy she has had days of formed stools alternating with days of loose stools. She has had no bright red blood per rectum or melena. Her appetite has been good and her weight has been stable. About 2 weeks ago the pain became much more bothersome and so she saw her primary care provider and was sent for an abdominal pelvic CT that revealed filling defects at the cecum, potentially stool artifacts but impossible to exclude small mass lesions. She has seen her gynecologist and was told her exam was normal, but she has not had a pelvic ultrasound. Her last period was 3 weeks ago, but she reports she has been experiencing some spotting over the past several months. She has had a tubal ligation 18 years ago. She denies a family history of colon cancer or inflammatory bowel disease, but states her father had colon polyps in his late 54s.   Past Medical History  Diagnosis Date  . Abdominal pain, epigastric 06/19/2010  . DEPRESSION 05/02/2010  . HYPERTHYROIDISM 05/02/2010  . HYPOTHYROIDISM 05/02/2010  . INSOMNIA 05/02/2010  . NAUSEA 06/19/2010  . TRANSAMINASES, SERUM, ELEVATED 05/02/2010    Past Surgical History  Procedure Laterality Date  . S/p compound fx left femur s/p rod, now removed    . Appendectomy    . Cholecystectomy  2011  . Tubal ligation     Family History  Problem Relation  Age of Onset  . Stroke Mother   . Depression Mother   . Alcohol abuse Mother   . Alcohol abuse Father   . Heart disease Father   . Hyperlipidemia Father   . Hypertension Father   . Diabetes Father   . Heart disease Other   . Hypertension Other   . Hyperlipidemia Other   . Stroke Other   . Diabetes Other   . COPD Other   . COPD Other   . Cancer Paternal Uncle     mets all over   . Breast cancer      M. Great grandmother   Social History  Substance Use Topics  . Smoking status: Never Smoker   . Smokeless tobacco: Never Used  . Alcohol Use: 0.0 oz/week    0 Standard drinks or equivalent per week     Comment: Occ   Current Outpatient Prescriptions  Medication Sig Dispense Refill  . ciprofloxacin (CIPRO) 500 MG tablet Take 1 tablet (500 mg total) by mouth 2 (two) times daily. 20 tablet 0  . HYDROcodone-acetaminophen (NORCO/VICODIN) 5-325 MG tablet Take 1 tablet by mouth every 6 (six) hours as needed for moderate pain. 40 tablet 0  . levothyroxine (SYNTHROID, LEVOTHROID) 100 MCG tablet Take 1 tablet (100 mcg total) by mouth daily before breakfast. 90 tablet 1  . metroNIDAZOLE (FLAGYL) 250 MG tablet Take 1 tablet (250 mg total) by mouth 3 (three) times  daily. 30 tablet 0   No current facility-administered medications for this visit.   Allergies  Allergen Reactions  . Sulfa Antibiotics     hives     Review of Systems: Gen: Denies any fever, chills, sweats, anorexia, fatigue, weakness, malaise, weight loss, and sleep disorder CV: Denies chest pain, angina, palpitations, syncope, orthopnea, PND, peripheral edema, and claudication. Resp: Denies dyspnea at rest, dyspnea with exercise, cough, sputum, wheezing, coughing up blood, and pleurisy. GI: Denies vomiting blood, jaundice, and fecal incontinence.   Denies dysphagia or odynophagia. GU : Denies urinary burning, blood in urine, urinary frequency, urinary hesitancy, nocturnal urination, and urinary incontinence. MS: Denies  joint pain, limitation of movement, and swelling, stiffness, low back pain, extremity pain. Denies muscle weakness, cramps, atrophy.  Derm: Denies rash, itching, dry skin, hives, moles, warts, or unhealing ulcers.  Psych: Denies depression, anxiety, memory loss, suicidal ideation, hallucinations, paranoia, and confusion. Heme: Denies bruising, bleeding, and enlarged lymph nodes. Neuro:  Denies any headaches, dizziness, paresthesias. Endo:  Denies any problems with DM, thyroid, adrenal function  Studies: Ct Abdomen Pelvis W Contrast  06/08/2015  CLINICAL DATA:  Several month history of RIGHT flank and RIGHT lower quadrant abdominal pain, bloating, symptoms more persistent in last 3 weeks, chronic diarrhea, history cholecystectomy, appendectomy, tubal ligation EXAM: CT ABDOMEN AND PELVIS WITH CONTRAST TECHNIQUE: Multidetector CT imaging of the abdomen and pelvis was performed using the standard protocol following bolus administration of intravenous contrast. Sagittal and coronal MPR images reconstructed from axial data set. CONTRAST:  Dilute oral contrast. 183mL OMNIPAQUE IOHEXOL 300 MG/ML SOLN IV COMPARISON:  12/29/2004 FINDINGS: Lung bases clear. Gallbladder and appendix surgically absent by history. Liver, spleen, pancreas, kidneys, and adrenal glands normal. Stomach and small bowel loops normal appearance. Filling defects at cecum, potentially stool artifacts but impossible to exclude small mass lesions. Remainder of colon unremarkable. Normal appearing bladder, ureters and LEFT ovary. Tiny cysts RIGHT ovary. No mass, adenopathy, free fluid, free air or inflammatory process. Osseous structures unremarkable. IMPRESSION: Filling defects at cecum may represent stool artifacts but unable to exclude cecal mass lesions with this appearance; consider followup endoscopy or barium enema to assess. Otherwise negative exam. Electronically Signed   By: Lavonia Dana M.D.   On: 06/08/2015 16:15    LAB RESULTS: CBC  06/08/2015 WBC 5.9, hemoglobin 13.6, hematocrit 41.1, platelets 215,000. Hepatic function panel total bili 0.7, direct bili 0.1, alkaline phosphatase 62, AST 35, ALT 54, albumin 3.9.   Physical Exam: BP 110/66 mmHg  Pulse 76  Ht 5' 8.5" (1.74 m)  Wt 189 lb 2 oz (85.787 kg)  BMI 28.33 kg/m2  LMP 06/01/2015 Constitutional: Pleasant,well-developed, occasion female in no acute distress. HEENT: Normocephalic and atraumatic. Conjunctivae are normal. No scleral icterus. Neck supple. No cervical adenopathy Cardiovascular: Normal rate, regular rhythm.  Pulmonary/chest: Effort normal and breath sounds normal. No wheezing, rales or rhonchi. Abdominal: Soft, nondistended, tender to palpation right lower quadrant, Bowel sounds active throughout. There are no masses palpable. No hepatomegaly. Extremities: no edema Lymphadenopathy: No cervical adenopathy noted. Neurological: Alert and oriented to person place and time. Skin: Skin is warm and dry. No rashes noted. Psychiatric: Normal mood and affect. Behavior is normal.  ASSESSMENT AND PLAN: 44 year old female with a 1-1-1/2 year history of right lower quadrant abdominal pain found to have filling defects at the cecum on CT scan, referred for evaluation. Patient is to be scheduled for colonoscopy to evaluate for polyps, neoplasia, or IBD.The risks, benefits, and alternatives to colonoscopy with possible  biopsy and possible polypectomy were discussed with the patient and they consent to proceed. The procedure will be scheduled with Dr. Ardis Hughs. Further recommendations will be made pending the findings of the above.    Jobanny Mavis, Deloris Ping 06/21/2015, 2:51 PM  CC: Biagio Borg, MD

## 2015-06-22 LAB — PREGNANCY, URINE: PREG TEST UR: NEGATIVE

## 2015-06-24 ENCOUNTER — Encounter: Payer: Self-pay | Admitting: Gastroenterology

## 2015-06-24 ENCOUNTER — Ambulatory Visit (AMBULATORY_SURGERY_CENTER): Payer: 59 | Admitting: Gastroenterology

## 2015-06-24 VITALS — BP 101/60 | HR 59 | Temp 96.8°F | Resp 18 | Ht 68.5 in | Wt 189.0 lb

## 2015-06-24 DIAGNOSIS — R938 Abnormal findings on diagnostic imaging of other specified body structures: Secondary | ICD-10-CM

## 2015-06-24 DIAGNOSIS — R9389 Abnormal findings on diagnostic imaging of other specified body structures: Secondary | ICD-10-CM

## 2015-06-24 DIAGNOSIS — D490 Neoplasm of unspecified behavior of digestive system: Secondary | ICD-10-CM

## 2015-06-24 MED ORDER — SODIUM CHLORIDE 0.9 % IV SOLN: 500.0000 mL | INTRAVENOUS | Status: DC

## 2015-06-24 NOTE — Patient Instructions (Signed)
YOU HAD AN ENDOSCOPIC PROCEDURE TODAY AT THE [facility]:   Refer to the procedure report that was given to you for any specific questions about what was found during the examination.  If the procedure report does not answer your questions, please call your gastroenterologist to clarify.  If you requested that your care partner not be given the details of your procedure findings, then the procedure report has been included in a sealed envelope for you to review at your convenience later.  YOU SHOULD EXPECT: Some feelings of bloating in the abdomen. Passage of more gas than usual.  Walking can help get rid of the air that was put into your GI tract during the procedure and reduce the bloating. If you had a lower endoscopy (such as a colonoscopy or flexible sigmoidoscopy) you may notice spotting of blood in your stool or on the toilet paper. If you underwent a bowel prep for your procedure, you may not have a normal bowel movement for a few days.  Please Note:  You might notice some irritation and congestion in your nose or some drainage.  This is from the oxygen used during your procedure.  There is no need for concern and it should clear up in a day or so.  SYMPTOMS TO REPORT IMMEDIATELY:   Following lower endoscopy (colonoscopy or flexible sigmoidoscopy):  Excessive amounts of blood in the stool  Significant tenderness or worsening of abdominal pains  Swelling of the abdomen that is new, acute  Fever of 100F or higher   For urgent or emergent issues, a gastroenterologist can be reached at any hour by calling [phone].   DIET: Your first meal following the procedure should be a small meal and then it is ok to progress to your normal diet. Heavy or fried foods are harder to digest and may make you feel nauseous or bloated.  Likewise, meals heavy in dairy and vegetables can increase bloating.  Drink plenty of fluids but you should avoid alcoholic beverages for 24  hours.  ACTIVITY:  You should plan to take it easy for the rest of today and you should NOT DRIVE or use heavy machinery until tomorrow (because of the sedation medicines used during the test).    FOLLOW UP: Our staff will call the number listed on your records the next business day following your procedure to check on you and address any questions or concerns that you may have regarding the information given to you following your procedure. If we do not reach you, we will leave a message.  However, if you are feeling well and you are not experiencing any problems, there is no need to return our call.  We will assume that you have returned to your regular daily activities without incident.  If any biopsies were taken you will be contacted by phone or by letter within the next 1-3 weeks.  Please call us at [phone] if you have not heard about the biopsies in 3 weeks.    SIGNATURES/CONFIDENTIALITY: You and/or your care partner have signed paperwork which will be entered into your electronic medical record.  These signatures attest to the fact that that the information above on your After Visit Summary has been reviewed and is understood.  Full responsibility of the confidentiality of this discharge information lies with you and/or your care-partner. 

## 2015-06-24 NOTE — Op Note (Signed)
[facility]  [address] [place], [id number]   COLONOSCOPY PROCEDURE REPORT  PATIENT: [name], [name]  MR#: [id number] BIRTHDATE: 12-16-1970 , 44  yrs. old GENDER: female ENDOSCOPIST: Milus Banister, MD REFERRED [id number] John, M.D. PROCEDURE DATE:  06/24/2015 PROCEDURE:   Colonoscopy, diagnostic First Screening Colonoscopy - Avg.  risk and is 50 yrs.  old or older - No.  Prior Negative Screening - Now for repeat screening. N/A  History of Adenoma - Now for follow-up colonoscopy & has been > or = to 3 yrs.  N/A  Recommend repeat exam, <10 yrs? No ASA CLASS:   Class II INDICATIONS:chronic RLQ pain led to CT scan, this suggested cecal mass. MEDICATIONS: Monitored anesthesia care and Propofol 200 mg IV  DESCRIPTION OF PROCEDURE:   After the risks benefits and alternatives of the procedure were thoroughly explained, informed consent was obtained.  The digital rectal exam revealed no abnormalities of the rectum.   The LB [id number]  endoscope was introduced through the anus and advanced to the terminal ileum which was intubated for a short distance. No adverse events experienced.   The quality of the prep was excellent.  The instrument was then slowly withdrawn as the colon was fully examined. Estimated blood loss is zero unless otherwise noted in this procedure report.   COLON FINDINGS: The examined terminal ileum appeared to be normal. A normal appearing cecum, ileocecal valve, and appendiceal orifice were identified.  The ascending, transverse, descending, sigmoid colon, and rectum appeared unremarkable.  Retroflexed views revealed no abnormalities. The time to cecum = 2.2 Withdrawal time = 8.1   The scope was withdrawn and the procedure completed. COMPLICATIONS: There were no immediate complications.  ENDOSCOPIC IMPRESSION: 1.   The examined terminal ileum appeared to be normal 2.   Normal colonoscopy  RECOMMENDATIONS: Follow  clinically.  eSigned:  Milus Banister, MD 06/24/2015 8:06 AM

## 2015-06-24 NOTE — Progress Notes (Signed)
Report to PACU, RN, vss, BBS= Clear.  

## 2015-06-27 ENCOUNTER — Telehealth: Payer: Self-pay | Admitting: *Deleted

## 2015-06-27 NOTE — Telephone Encounter (Signed)
Message left

## 2015-11-05 ENCOUNTER — Other Ambulatory Visit: Payer: Self-pay | Admitting: Internal Medicine

## 2015-11-07 ENCOUNTER — Other Ambulatory Visit: Payer: Self-pay | Admitting: Internal Medicine

## 2016-01-05 ENCOUNTER — Encounter: Payer: Self-pay | Admitting: Internal Medicine

## 2016-01-05 ENCOUNTER — Ambulatory Visit (INDEPENDENT_AMBULATORY_CARE_PROVIDER_SITE_OTHER): Payer: 59 | Admitting: Internal Medicine

## 2016-01-05 VITALS — BP 138/80 | HR 63 | Temp 97.9°F | Resp 20 | Wt 190.0 lb

## 2016-01-05 DIAGNOSIS — M5416 Radiculopathy, lumbar region: Secondary | ICD-10-CM | POA: Diagnosis not present

## 2016-01-05 MED ORDER — PREDNISONE 10 MG PO TABS: ORAL_TABLET | ORAL | Status: DC

## 2016-01-05 MED ORDER — HYDROCODONE-ACETAMINOPHEN 7.5-325 MG PO TABS: 1.0000 | ORAL_TABLET | Freq: Four times a day (QID) | ORAL | Status: DC | PRN

## 2016-01-05 MED ORDER — CYCLOBENZAPRINE HCL 5 MG PO TABS: 5.0000 mg | ORAL_TABLET | Freq: Three times a day (TID) | ORAL | Status: DC | PRN

## 2016-01-05 NOTE — Progress Notes (Signed)
Pre visit review using our clinic review tool, if applicable. No additional management support is needed unless otherwise documented below in the visit note. 

## 2016-01-05 NOTE — Progress Notes (Signed)
Subjective:    Patient ID: [name], female    DOB: Apr 02, 1971, 45 y.o.   MRN: [id number]  HPI  Here with acute onset 4 days mildine low back, no incitiing event, started midline with gradually worsening now severe, constant, 9/10 with radiation to the left buttock and LLE to the calf, assoc with mild weakness and heaviness, worse in any position except maybe less with walking after the first few steps. Pt denies chest pain, increased sob or doe, wheezing, orthopnea, PND, increased LE swelling, palpitations, dizziness or syncope.  Denies urinary symptoms such as dysuria, frequency, urgency, flank pain, hematuria or n/v, fever, chills. Denies worsening reflux, abd pain, dysphagia, n/v, bowel change or blood.  No prior similar symptoms, recent MRI, surgical evaluation.  Last imaging LS spine films 2006 neg for acute. Past Medical History  Diagnosis Date  . Abdominal pain, epigastric 06/19/2010  . DEPRESSION 05/02/2010  . HYPERTHYROIDISM 05/02/2010  . HYPOTHYROIDISM 05/02/2010  . INSOMNIA 05/02/2010  . NAUSEA 06/19/2010  . TRANSAMINASES, SERUM, ELEVATED 05/02/2010  . Allergy    Past Surgical History  Procedure Laterality Date  . S/p compound fx left femur s/p rod, now removed    . Appendectomy    . Cholecystectomy  2011  . Tubal ligation      reports that she has never smoked. She has never used smokeless tobacco. She reports that she drinks alcohol. She reports that she does not use illicit drugs. family history includes Alcohol abuse in her father and mother; COPD in her other and other; Cancer in her paternal uncle; Depression in her mother; Diabetes in her father and other; Heart disease in her father and other; Hyperlipidemia in her father and other; Hypertension in her father and other; Stroke in her mother and other. Allergies  Allergen Reactions  . Sulfa Antibiotics     hives   Current Outpatient Prescriptions on File Prior to Visit  Medication Sig Dispense Refill  .  levothyroxine (SYNTHROID, LEVOTHROID) 100 MCG tablet TAKE ONE TABLET BY MOUTH ONCE DAILY BEFORE  BREAKFAST 90 tablet 0  . levothyroxine (SYNTHROID, LEVOTHROID) 100 MCG tablet TAKE ONE TABLET BY MOUTH ONCE DAILY BEFORE  BREAKFAST 90 tablet 0   No current facility-administered medications on file prior to visit.   Review of Systems  Constitutional: Negative for unusual diaphoresis or night sweats HENT: Negative for ear swelling or discharge Eyes: Negative for worsening visual haziness  Respiratory: Negative for choking and stridor.   Gastrointestinal: Negative for distension or worsening eructation Genitourinary: Negative for retention or change in urine volume.  Musculoskeletal: Negative for other MSK pain or swelling Skin: Negative for color change and worsening wound Neurological: Negative for tremors and numbness other than noted  Psychiatric/Behavioral: Negative for decreased concentration or agitation other than above       Objective:   Physical Exam BP 138/80 mmHg  Pulse 63  Temp(Src) 97.9 F (36.6 C) (Oral)  Resp 20  Wt 190 lb (86.183 kg)  SpO2 97% VS noted,  Constitutional: Pt appears in no apparent distress HENT: Head: NCAT.  Right Ear: External ear normal.  Left Ear: External ear normal.  Eyes: . Pupils are equal, round, and reactive to light. Conjunctivae and EOM are normal Neck: Normal range of motion. Neck supple.  Cardiovascular: Normal rate and regular rhythm.   Pulmonary/Chest: Effort normal and breath sounds without rales or wheezing.  Abd:  Soft, NT, ND, + BS Spine: mod to severe tender low lumbar midline with some left  paravertebral and left buttock, without rash or swelling  Neurological: Pt is alert. Not confused , motor 4+/5 LLE o/w intact throughout, sens/dtr intact to LT Skin: Skin is warm. No rash, no LE edema Psychiatric: Pt behavior is normal. No agitation.         Assessment & Plan:

## 2016-01-05 NOTE — Assessment & Plan Note (Signed)
Acute onset severe pain and LLE mild weakness/neuro change, for pain control, muscle relaxer as needed, predpac asd, MRI LS spine and refer ortho

## 2016-01-05 NOTE — Patient Instructions (Addendum)
You had the pain shot in the office (toradol)  Please take all new medication as prescribed- the hydrodocone pain medication, muscle relaxer (flexeril) and prednisone  Please continue all other medications as before, and refills have been done if requested.  Please have the pharmacy call with any other refills you may need.  Please keep your appointments with your specialists as you may have planned  You will be contacted regarding the referral for: MRI for the lower back, and orthopedic

## 2016-01-07 ENCOUNTER — Encounter (HOSPITAL_COMMUNITY): Payer: Self-pay | Admitting: Emergency Medicine

## 2016-01-07 ENCOUNTER — Emergency Department (HOSPITAL_COMMUNITY)
Admission: EM | Admit: 2016-01-07 | Discharge: 2016-01-07 | Disposition: A | Discharge status: Home / Self Care | Payer: 59 | Attending: Emergency Medicine | Admitting: Emergency Medicine

## 2016-01-07 ENCOUNTER — Emergency Department (HOSPITAL_COMMUNITY): Payer: 59

## 2016-01-07 DIAGNOSIS — M545 Low back pain, unspecified: Secondary | ICD-10-CM

## 2016-01-07 DIAGNOSIS — M5441 Lumbago with sciatica, right side: Secondary | ICD-10-CM | POA: Diagnosis not present

## 2016-01-07 DIAGNOSIS — Z79899 Other long term (current) drug therapy: Secondary | ICD-10-CM | POA: Diagnosis not present

## 2016-01-07 DIAGNOSIS — M5126 Other intervertebral disc displacement, lumbar region: Secondary | ICD-10-CM | POA: Diagnosis not present

## 2016-01-07 DIAGNOSIS — M5431 Sciatica, right side: Secondary | ICD-10-CM

## 2016-01-07 LAB — CBC WITH DIFFERENTIAL/PLATELET
BASOS ABS: 0 10*3/uL (ref 0.0–0.1)
BASOS PCT: 0 %
EOS ABS: 0 10*3/uL (ref 0.0–0.7)
EOS PCT: 0 %
HEMATOCRIT: 39.6 % (ref 36.0–46.0)
Hemoglobin: 13.3 g/dL (ref 12.0–15.0)
LYMPHS ABS: 1.8 10*3/uL (ref 0.7–4.0)
Lymphocytes Relative: 22 %
MCH: 31.6 pg (ref 26.0–34.0)
MCHC: 33.6 g/dL (ref 30.0–36.0)
MCV: 94.1 fL (ref 78.0–100.0)
Monocytes Absolute: 0.4 10*3/uL (ref 0.1–1.0)
Monocytes Relative: 5 %
NEUTROS PCT: 73 %
Neutro Abs: 5.9 10*3/uL (ref 1.7–7.7)
Platelets: 236 10*3/uL (ref 150–400)
RBC: 4.21 MIL/uL (ref 3.87–5.11)
RDW: 14 % (ref 11.5–15.5)
WBC: 8.1 10*3/uL (ref 4.0–10.5)

## 2016-01-07 LAB — COMPREHENSIVE METABOLIC PANEL
ALT: 26 U/L (ref 14–54)
AST: 25 U/L (ref 15–41)
Albumin: 4.2 g/dL (ref 3.5–5.0)
Alkaline Phosphatase: 48 U/L (ref 38–126)
Anion gap: 8 (ref 5–15)
BILIRUBIN TOTAL: 0.7 mg/dL (ref 0.3–1.2)
BUN: 20 mg/dL (ref 6–20)
CHLORIDE: 104 mmol/L (ref 101–111)
CO2: 23 mmol/L (ref 22–32)
CREATININE: 0.72 mg/dL (ref 0.44–1.00)
Calcium: 9 mg/dL (ref 8.9–10.3)
Glucose, Bld: 105 mg/dL — ABNORMAL HIGH (ref 65–99)
Potassium: 4.6 mmol/L (ref 3.5–5.1)
Sodium: 135 mmol/L (ref 135–145)
TOTAL PROTEIN: 7.7 g/dL (ref 6.5–8.1)

## 2016-01-07 LAB — I-STAT BETA HCG BLOOD, ED (MC, WL, AP ONLY): I-stat hCG, quantitative: <5 m[IU]/mL (ref <5)

## 2016-01-07 MED ORDER — ONDANSETRON HCL 4 MG/2ML IJ SOLN
4.0000 mg | Freq: Once | INTRAMUSCULAR | Status: AC
  Administered 2016-01-07: 4 mg via INTRAVENOUS
  Filled 2016-01-07 (×2): qty 2

## 2016-01-07 MED ORDER — FENTANYL CITRATE (PF) 100 MCG/2ML IJ SOLN
50.0000 ug | Freq: Once | INTRAMUSCULAR | Status: AC
  Administered 2016-01-07: 50 ug via INTRAMUSCULAR
  Filled 2016-01-07: qty 2

## 2016-01-07 MED ORDER — OXYCODONE-ACETAMINOPHEN 5-325 MG PO TABS: 1.0000 | ORAL_TABLET | Freq: Four times a day (QID) | ORAL | Status: DC | PRN

## 2016-01-07 MED ORDER — HYDROMORPHONE HCL 1 MG/ML IJ SOLN
1.0000 mg | Freq: Once | INTRAMUSCULAR | Status: AC
  Administered 2016-01-07: 1 mg via INTRAVENOUS
  Filled 2016-01-07 (×2): qty 1

## 2016-01-07 MED ORDER — KETOROLAC TROMETHAMINE 30 MG/ML IJ SOLN
30.0000 mg | Freq: Once | INTRAMUSCULAR | Status: AC
  Administered 2016-01-07: 30 mg via INTRAVENOUS
  Filled 2016-01-07: qty 1

## 2016-01-07 MED ORDER — DIAZEPAM 5 MG/ML IJ SOLN
5.0000 mg | Freq: Once | INTRAMUSCULAR | Status: AC
  Administered 2016-01-07: 5 mg via INTRAVENOUS
  Filled 2016-01-07: qty 2

## 2016-01-07 MED ORDER — NAPROXEN 500 MG PO TABS: 500.0000 mg | ORAL_TABLET | Freq: Two times a day (BID) | ORAL | Status: DC

## 2016-01-07 MED ORDER — METHOCARBAMOL 500 MG PO TABS: 500.0000 mg | ORAL_TABLET | Freq: Two times a day (BID) | ORAL | Status: DC

## 2016-01-07 NOTE — ED Notes (Signed)
To m.r.i. At this time.  Her family remain at bedside.

## 2016-01-07 NOTE — ED Notes (Signed)
Attempted IV x2, will have another RN attempt.

## 2016-01-07 NOTE — ED Notes (Addendum)
RN will draw labs while placing IV

## 2016-01-07 NOTE — ED Provider Notes (Signed)
CSN: [id number]     Arrival date & time 01/07/16  0446 History   First MD Initiated Contact with Patient 01/07/16 [phone]     Chief Complaint  Patient presents with  . Back Pain    HPI   [name] is an 45 y.o. female who presents to the ED for evaluation of low back pain. She states the pain started suddenly about six days ago. She states the pain is severe, constant, and progressive. She states the pain starts in her low back and radiates down her right leg, and now is getting so bad it is going down both legs. She states she saw her PCP two days ago for evaluation who ordered an outpatient MRI and started her on steroids, muscle relaxers, and pain medicine with no relief. She states she is here today because the pain is so excruciating it is unbearable. She denies known injury or trauma. She reports associated numbness, paresthesia, and weakness in her right leg. The pain is worse with movement. She states nothing like this has ever happened before. Denies bowel/bladder incontinence, saddle paresthesia, fever, chills, h/o IVDU.  Past Medical History  Diagnosis Date  . Abdominal pain, epigastric 06/19/2010  . DEPRESSION 05/02/2010  . HYPERTHYROIDISM 05/02/2010  . HYPOTHYROIDISM 05/02/2010  . INSOMNIA 05/02/2010  . NAUSEA 06/19/2010  . TRANSAMINASES, SERUM, ELEVATED 05/02/2010  . Allergy    Past Surgical History  Procedure Laterality Date  . S/p compound fx left femur s/p rod, now removed    . Appendectomy    . Cholecystectomy  2011  . Tubal ligation     Family History  Problem Relation Age of Onset  . Stroke Mother   . Depression Mother   . Alcohol abuse Mother   . Alcohol abuse Father   . Heart disease Father   . Hyperlipidemia Father   . Hypertension Father   . Diabetes Father   . Heart disease Other   . Hypertension Other   . Hyperlipidemia Other   . Stroke Other   . Diabetes Other   . COPD Other   . COPD Other   . Cancer Paternal Uncle     mets all over   . Breast  cancer      M. Great grandmother   Social History  Substance Use Topics  . Smoking status: Never Smoker   . Smokeless tobacco: Never Used  . Alcohol Use: 0.0 oz/week    0 Standard drinks or equivalent per week     Comment: Occ   OB History    No data available     Review of Systems  All other systems reviewed and are negative.     Allergies  Sulfa antibiotics  Home Medications   Prior to Admission medications   Medication Sig Start Date End Date Taking? Authorizing Provider  cyclobenzaprine (FLEXERIL) 5 MG tablet Take 1 tablet (5 mg total) by mouth 3 (three) times daily as needed for muscle spasms. 01/05/16  Yes Biagio Borg, MD  HYDROcodone-acetaminophen (NORCO) 7.5-325 MG tablet Take 1 tablet by mouth every 6 (six) hours as needed for moderate pain. 01/05/16  Yes Biagio Borg, MD  levothyroxine (SYNTHROID, LEVOTHROID) 100 MCG tablet TAKE ONE TABLET BY MOUTH ONCE DAILY BEFORE  BREAKFAST 11/07/15  Yes Biagio Borg, MD  predniSONE (DELTASONE) 10 MG tablet 3 tabs by mouth per day for 3 days,2tabs per day for 3 days,1tab per day for 3 days 01/05/16  Yes Biagio Borg, MD  levothyroxine (SYNTHROID, LEVOTHROID) 100 MCG tablet TAKE ONE TABLET BY MOUTH ONCE DAILY BEFORE  BREAKFAST Patient not taking: Reported on 01/07/2016 11/08/15   Biagio Borg, MD   BP 105/54 mmHg  Pulse 62  Temp(Src) 97.5 F (36.4 C) (Oral)  Resp 18  Ht 5\' 8"  (1.727 m)  Wt 84.823 kg  BMI 28.44 kg/m2  SpO2 100%  LMP 01/02/2016 Physical Exam  Constitutional: She is oriented to person, place, and time.  Tearful  HENT:  Right Ear: External ear normal.  Left Ear: External ear normal.  Nose: Nose normal.  Mouth/Throat: Oropharynx is clear and moist. No oropharyngeal exudate.  Eyes: Conjunctivae and EOM are normal. Pupils are equal, round, and reactive to light.  Neck: Normal range of motion. Neck supple.  Cardiovascular: Normal rate, regular rhythm, normal heart sounds and intact distal pulses.     Pulmonary/Chest: Effort normal and breath sounds normal. No respiratory distress. She has no wheezes. She exhibits no tenderness.  Abdominal: Soft. Bowel sounds are normal. She exhibits no distension. There is no tenderness. There is no rebound and no guarding.  Musculoskeletal: She exhibits no edema.       Back:  Neurological: She is alert and oriented to person, place, and time. No cranial nerve deficit.  UE strength and sensation intact Patellar and achilles DTR decreased in R LE though present bilaterally 4/5 strength in bilateral LE Pt reports decreased/different sensation to light touch in R LE, though she is able to sense light touch Normal finger to nose No pronator drift Painful but intact heel-shin  Skin: Skin is warm and dry.  Psychiatric: She has a normal mood and affect.  Nursing note and vitals reviewed.   ED Course  Procedures (including critical care time) Labs Review Labs Reviewed  COMPREHENSIVE METABOLIC PANEL - Abnormal; Notable for the following:    Glucose, Bld 105 (*)    All other components within normal limits  CBC WITH DIFFERENTIAL/PLATELET  I-STAT BETA HCG BLOOD, ED ([facility], [facility], [facility] ONLY)    Imaging Review Mr Lumbar Spine Wo Contrast  01/07/2016  CLINICAL DATA:  Sudden onset right leg pain starting at 4 a.m.; leg spasms. EXAM: MRI LUMBAR SPINE WITHOUT CONTRAST TECHNIQUE: Multiplanar, multisequence MR imaging of the lumbar spine was performed. No intravenous contrast was administered. COMPARISON:  06/08/2015 FINDINGS: Segmentation: The lowest lumbar type non-rib-bearing vertebra is labeled as L5. Alignment:  Unremarkable Vertebrae: Very small hemangioma centrally in the L2 vertebral body. Disc desiccation at L5-S1 with small Schmorl's node along the inferior endplate of L5 with subtle adjacent marrow edema or reactive marrow findings. Conus medullaris: Extends to the L1 level and appears normal. Paraspinal and other soft tissues: Unremarkable Disc levels: L1-2:  Unremarkable. L2- 3:  Unremarkable. L3-4:  Unremarkable. L4-5: Mild right foraminal stenosis and mild bilateral subarticular lateral recess stenosis with mild displacement of the right L4 nerve in the lateral extraforaminal space due to disc bulge and right foraminal and lateral extraforaminal disc protrusion. L5-S1: Moderate right subarticular lateral recess stenosis due to a right lateral recess disc extrusion or less likely disc fragment extending caudad on image 8 series 4 and image 37/6. IMPRESSION: 1. The dominant finding is the right lateral recess disc extrusion at L5-S1, causing moderate impingement on the right S1 nerve roots. 2. There is also mild bilateral impingement at L4-5 due to spondylosis and degenerative disc disease. Electronically Signed   By: Van Clines M.D.   On: 01/07/2016 10:22   I have personally reviewed and  evaluated these images and lab results as part of my medical decision-making.   EKG Interpretation None      MDM   Final diagnoses:  Low back pain  Lumbar herniated disc  Sciatica of right side    Pain improved though not resolved with pain meds. Pt declines furhter pain meds at this time and does not want anything else that will make her feel "loopy." labs unremarkable. Exam does reveal point tenderness of the L-spine with some neuro findings. Will obtain MRI lumbar spine.   On reassessment pt reports pain increasing again. Amenable to a dose of Valium. MRI is still pending.  MRi reveals extrusion of disc at L5-S1 with impingement on right S1 nerve. There is also mild DDD and spondylosis of L4-5 causing some impingement as well. Findings discussed with pt. Will refer to neurosurgery for follow up. In the meantime will change home prescriptions to percocet and robaxin. Instructed to complete prednisone taper. ER return precautions given.  Anne Ng, PA-C [id number] [phone]  Delora Fuel, MD [phone]

## 2016-01-07 NOTE — ED Notes (Signed)
Pt reports having sudden onset of pain into right leg this morning at appx 4am. Pt reports having start of lower back pain on 01/01/16 with radiation into left leg and was seen by pcp this week. Pt reports right leg is contracting and unrelieved with medication at home.

## 2016-01-07 NOTE — Discharge Instructions (Signed)
Sciatica °Sciatica is pain, weakness, numbness, or tingling along the path of the sciatic nerve. The nerve starts in the lower back and runs down the back of each leg. The nerve controls the muscles in the lower leg and in the back of the knee, while also providing sensation to the back of the thigh, lower leg, and the sole of your foot. Sciatica is a symptom of another medical condition. For instance, nerve damage or certain conditions, such as a herniated disk or bone spur on the spine, pinch or put pressure on the sciatic nerve. This causes the pain, weakness, or other sensations normally associated with sciatica. Generally, sciatica only affects one side of the body. °CAUSES  °· Herniated or slipped disc. °· Degenerative disk disease. °· A pain disorder involving the narrow muscle in the buttocks (piriformis syndrome). °· Pelvic injury or fracture. °· Pregnancy. °· Tumor (rare). °SYMPTOMS  °Symptoms can vary from mild to very severe. The symptoms usually travel from the low back to the buttocks and down the back of the leg. Symptoms can include: °· Mild tingling or dull aches in the lower back, leg, or hip. °· Numbness in the back of the calf or sole of the foot. °· Burning sensations in the lower back, leg, or hip. °· Sharp pains in the lower back, leg, or hip. °· Leg weakness. °· Severe back pain inhibiting movement. °These symptoms may get worse with coughing, sneezing, laughing, or prolonged sitting or standing. Also, being overweight may worsen symptoms. °DIAGNOSIS  °Your caregiver will perform a physical exam to look for common symptoms of sciatica. He or she may ask you to do certain movements or activities that would trigger sciatic nerve pain. Other tests may be performed to find the cause of the sciatica. These may include: °· Blood tests. °· X-rays. °· Imaging tests, such as an MRI or CT scan. °TREATMENT  °Treatment is directed at the cause of the sciatic pain. Sometimes, treatment is not necessary  and the pain and discomfort goes away on its own. If treatment is needed, your caregiver may suggest: °· Over-the-counter medicines to relieve pain. °· Prescription medicines, such as anti-inflammatory medicine, muscle relaxants, or narcotics. °· Applying heat or ice to the painful area. °· Steroid injections to lessen pain, irritation, and inflammation around the nerve. °· Reducing activity during periods of pain. °· Exercising and stretching to strengthen your abdomen and improve flexibility of your spine. Your caregiver may suggest losing weight if the extra weight makes the back pain worse. °· Physical therapy. °· Surgery to eliminate what is pressing or pinching the nerve, such as a bone spur or part of a herniated disk. °HOME CARE INSTRUCTIONS  °· Only take over-the-counter or prescription medicines for pain or discomfort as directed by your caregiver. °· Apply ice to the affected area for 20 minutes, 3-4 times a day for the first 48-72 hours. Then try heat in the same way. °· Exercise, stretch, or perform your usual activities if these do not aggravate your pain. °· Attend physical therapy sessions as directed by your caregiver. °· Keep all follow-up appointments as directed by your caregiver. °· Do not wear high heels or shoes that do not provide proper support. °· Check your mattress to see if it is too soft. A firm mattress may lessen your pain and discomfort. °SEEK IMMEDIATE MEDICAL CARE IF:  °· You lose control of your bowel or bladder (incontinence). °· You have increasing weakness in the lower back, pelvis, buttocks,   or legs.  You have redness or swelling of your back.  You have a burning sensation when you urinate.  You have pain that gets worse when you lie down or awakens you at night.  Your pain is worse than you have experienced in the past.  Your pain is lasting longer than 4 weeks.  You are suddenly losing weight without reason. MAKE SURE YOU:  Understand these  instructions.  Will watch your condition.  Will get help right away if you are not doing well or get worse.   This information is not intended to replace advice given to you by your health care provider. Make sure you discuss any questions you have with your health care provider.   Document Released: 07/24/2001 Document Revised: 04/20/2015 Document Reviewed: 12/09/2011 Elsevier Interactive Patient Education 2016 [employer]   Herniated Disk A herniated disk occurs when a disk in your spine bulges out too far. This condition is also called a ruptured disk or slipped disk. Your spine (backbone) is made up of bones called vertebrae. Between each pair of vertebrae is an oval disk with a soft, spongy center that acts as a shock absorber when you move. The spongy center is surrounded by a tough outer ring. When you have a herniated disk, the spongy center of the disk bulges out or ruptures through the outer ring. A herniated disk can press on a nerve between your vertebrae and cause pain. A herniated disk can occur anywhere in your back or neck area, but the lower back is the most common spot. CAUSES  In many cases, a herniated disk occurs just from getting older. As you age, the spongy insides of your disks tend to shrink and dry out. A herniated disk can result from gradual wear and tear. Injury or sudden strain can also cause a herniated disk.  RISK FACTORS Aging is the main risk factor for a herniated disk. Other risk factors include:  Being a man between the ages of 24 and 16 years.  Having a job that requires heavy lifting, bending, or twisting.  Having a job that requires long hours of driving.  Not getting enough exercise.  Being overweight.  Smoking. SIGNS AND SYMPTOMS  Signs and symptoms depend on which disk is herniated.  For a herniated disk in the lower back, you may have sharp pain in:  One part of your leg, hip, or buttocks.  The back of your calf.  The top or sole  of your foot (sciatica).   For a herniated disk in the neck, you may feel pain:  When you move your neck.  Near or over your shoulder blade.  That moves to your upper arm, forearm, or fingers.   You may also have muscle weakness. It may be hard to:  Lift your leg or arm.  Stand on your toes.  Squeeze tightly with one of your hands.  Other symptoms can include:  Numbness or tingling in the affected areas of your body.  Loss of bladder or bowel control. This is a rare but serious sign of a severe herniated disk in the lower back. DIAGNOSIS  Your health care provider will do a physical exam. During this exam, you may have to move certain body parts or assume various positions. For example, your health care provider may do the straight-leg test. This is a good way to test for a herniated disk in your lower back. In this test, the health care provider lifts your leg while  you lie on your back. This is to see if you feel pain down your leg. Your health care provider will also check for numbness or loss of feeling.  Your health care provider will also check your:  Reflexes.  Muscle strength.  Posture.  Other tests may be done to help in making a diagnosis. These may include:  An X-ray of the spine to rule out other causes of back pain.   Other imaging studies, such as an MRI or CT scan. This is to check whether the herniated disk is pressing on your spinal canal.  Electromyography (EMG). This test checks the nerves that control muscles. It is sometimes used to identify the specific area of nerve involvement.  TREATMENT  In many cases, herniated disk symptoms go away over a period of days or weeks. You will most likely be free of symptoms in 3-4 months. Treatment may include the following:  The initial treatment for a herniated disk is ashort period of rest.  Bed rest is often limited to 1 or 2 days. Resting for too long delays recovery.  If you have a herniated disk in  your lower back, you should avoid sitting as much as possible because sitting increases pressure on the disk.  Medicines. These may include:   Nonsteroidal anti-inflammatory drugs (NSAIDs).  Muscle relaxants for back spasms.  Narcotic pain medicine if your pain is very bad.   Steroid injections. You may need these along the involved nerve root to help control pain. The steroid is injected in the area of the herniated disk. It helps by reducing swelling around the disk.  Physical therapy. This may include exercises to strengthen the muscles that help support your spine.   You may need surgery if other treatments do not work.  HOME CARE INSTRUCTIONS Follow all your health care provider's instructions. These may include:  Take all medicines as directed by your health care provider.  Rest for 2 days and then start moving.  Do not sit or stand for long periods of time.  Maintain good posture when sitting and standing.  Avoid movements that cause pain, such as bending or lifting.  When you are able to start lifting things again:  [place] with your knees.  Keep your back straight.  Hold heavy objects close to your body.  If you are overweight, ask your health care provider to help you start a weight-loss program.  When you are able to start exercising, ask your health care provider how much and what type of exercise is best for you.  Work with a physical therapist on stretching and strengthening exercises for your back.  Do not wear high-heeled shoes.  Do not sleep on your belly.  Do not smoke.  Keep all follow-up visits as directed by your health care provider. SEEK MEDICAL CARE IF:  You have back or neck pain that is not getting better after 4 weeks.  You have very bad pain in your back or neck.  You develop numbness, tingling, or weakness along with pain. SEEK IMMEDIATE MEDICAL CARE IF:   You have numbness, tingling, or weakness that makes you unable to use your  arms or legs.  You lose control of your bladder or bowels.  You have dizziness or fainting.  You have shortness of breath.  MAKE SURE YOU:   Understand these instructions.  Will watch your condition.  Will get help right away if you are not doing well or get worse.   This information is  not intended to replace advice given to you by your health care provider. Make sure you discuss any questions you have with your health care provider.   Document Released: 07/27/2000 Document Revised: 08/20/2014 Document Reviewed: 07/03/2013 Elsevier Interactive Patient Education [employer].

## 2016-01-11 ENCOUNTER — Ambulatory Visit: Payer: Self-pay | Admitting: Orthopedic Surgery

## 2016-01-12 ENCOUNTER — Encounter (HOSPITAL_COMMUNITY)
Admission: RE | Admit: 2016-01-12 | Discharge: 2016-01-12 | Disposition: A | Discharge status: Home / Self Care | Payer: 59 | Source: Ambulatory Visit | Attending: Specialist | Admitting: Specialist

## 2016-01-12 ENCOUNTER — Encounter (HOSPITAL_COMMUNITY): Payer: Self-pay

## 2016-01-12 ENCOUNTER — Ambulatory Visit (HOSPITAL_COMMUNITY)
Admission: RE | Admit: 2016-01-12 | Discharge: 2016-01-12 | Disposition: A | Discharge status: Home / Self Care | Payer: 59 | Source: Ambulatory Visit | Attending: Orthopedic Surgery | Admitting: Orthopedic Surgery

## 2016-01-12 DIAGNOSIS — M5126 Other intervertebral disc displacement, lumbar region: Secondary | ICD-10-CM | POA: Diagnosis present

## 2016-01-12 DIAGNOSIS — M5137 Other intervertebral disc degeneration, lumbosacral region: Secondary | ICD-10-CM | POA: Diagnosis not present

## 2016-01-12 HISTORY — DX: Malignant (primary) neoplasm, unspecified: C80.1

## 2016-01-12 LAB — ABO/RH: ABO/RH(D): O POS

## 2016-01-12 LAB — SURGICAL PCR SCREEN
MRSA, PCR: NEGATIVE
STAPHYLOCOCCUS AUREUS: NEGATIVE

## 2016-01-12 NOTE — Progress Notes (Signed)
Labs-01/07/16- CBC/DIFF/CMP/SERUM PREG - in EPIC  EKG-03/03/15- EPIC

## 2016-01-12 NOTE — Patient Instructions (Signed)
[place]  01/12/2016   Your procedure is scheduled on: 01/13/2016    Report to [facility] Main  Entrance take [facility]  elevators to 3rd floor to  [place] at    200pm  Call this number if you have problems the morning of surgery [phone]   Remember: ONLY 1 PERSON MAY GO WITH YOU TO SHORT STAY TO GET  READY MORNING OF YOUR SURGERY.  Do not eat food after midnite.  May have clear liquids from 12 midnite until 0900am morning of surgery then nothing by mouth.       Take these medicines the morning of surgery with A SIP OF WATER: Hydrcodone or oxycodone if needed, flexeril if needed, Synthroid,                                 You may not have any metal on your body including hair pins and              piercings  Do not wear jewelry, make-up, lotions, powders or perfumes, deodorant             Do not wear nail polish.  Do not shave  48 hours prior to surgery.                 Do not bring valuables to the hospital. [facility].  Contacts, dentures or bridgework may not be worn into surgery.  Leave suitcase in the car. After surgery it may be brought to your room.         Special Instructions: coughing and deep breathing exercises, leg exercises               Please read over the following fact sheets you were given: _____________________________________________________________________             [facility] - Preparing for Surgery Before surgery, you can play an important role.  Because skin is not sterile, your skin needs to be as free of germs as possible.  You can reduce the number of germs on your skin by washing with CHG (chlorahexidine gluconate) soap before surgery.  CHG is an antiseptic cleaner which kills germs and bonds with the skin to continue killing germs even after washing. Please DO NOT use if you have an allergy to CHG or antibacterial soaps.  If your skin becomes reddened/irritated  stop using the CHG and inform your nurse when you arrive at Short Stay. Do not shave (including legs and underarms) for at least 48 hours prior to the first CHG shower.  You may shave your face/neck. Please follow these instructions carefully:  1.  Shower with CHG Soap the night before surgery and the  morning of Surgery.  2.  If you choose to wash your hair, wash your hair first as usual with your  normal  shampoo.  3.  After you shampoo, rinse your hair and body thoroughly to remove the  shampoo.                           4.  Use CHG as you would any other liquid soap.  You can apply chg directly  to the skin  and wash                       Gently with a scrungie or clean washcloth.  5.  Apply the CHG Soap to your body ONLY FROM THE NECK DOWN.   Do not use on face/ open                           Wound or open sores. Avoid contact with eyes, ears mouth and genitals (private parts).                       Wash face,  Genitals (private parts) with your normal soap.             6.  Wash thoroughly, paying special attention to the area where your surgery  will be performed.  7.  Thoroughly rinse your body with warm water from the neck down.  8.  DO NOT shower/wash with your normal soap after using and rinsing off  the CHG Soap.                9.  Pat yourself dry with a clean towel.            10.  Wear clean pajamas.            11.  Place clean sheets on your bed the night of your first shower and do not  sleep with pets. Day of Surgery : Do not apply any lotions/deodorants the morning of surgery.  Please wear clean clothes to the hospital/surgery center.  FAILURE TO FOLLOW THESE INSTRUCTIONS MAY RESULT IN THE CANCELLATION OF YOUR SURGERY PATIENT SIGNATURE_________________________________  NURSE SIGNATURE__________________________________  ________________________________________________________________________   [name]  An incentive spirometer is a tool that can help keep your  lungs clear and active. This tool measures how well you are filling your lungs with each breath. Taking long deep breaths may help reverse or decrease the chance of developing breathing (pulmonary) problems (especially infection) following:  A long period of time when you are unable to move or be active. BEFORE THE PROCEDURE   If the spirometer includes an indicator to show your best effort, your nurse or respiratory therapist will set it to a desired goal.  If possible, sit up straight or lean slightly forward. Try not to slouch.  Hold the incentive spirometer in an upright position. INSTRUCTIONS FOR USE   Sit on the edge of your bed if possible, or sit up as far as you can in bed or on a chair.  Hold the incentive spirometer in an upright position.  Breathe out normally.  Place the mouthpiece in your mouth and seal your lips tightly around it.  Breathe in slowly and as deeply as possible, raising the piston or the ball toward the top of the column.  Hold your breath for 3-5 seconds or for as long as possible. Allow the piston or ball to fall to the bottom of the column.  Remove the mouthpiece from your mouth and breathe out normally.  Rest for a few seconds and repeat Steps 1 through 7 at least 10 times every 1-2 hours when you are awake. Take your time and take a few normal breaths between deep breaths.  The spirometer may include an indicator to show your best effort. Use the indicator as a goal to work toward during each repetition.  After each set of 10  deep breaths, practice coughing to be sure your lungs are clear. If you have an incision (the cut made at the time of surgery), support your incision when coughing by placing a pillow or rolled up towels firmly against it. Once you are able to get out of bed, walk around indoors and cough well. You may stop using the incentive spirometer when instructed by your caregiver.  RISKS AND COMPLICATIONS  Take your time so you do not  get dizzy or light-headed.  If you are in pain, you may need to take or ask for pain medication before doing incentive spirometry. It is harder to take a deep breath if you are having pain. AFTER USE  Rest and breathe slowly and easily.  It can be helpful to keep track of a log of your progress. Your caregiver can provide you with a simple table to help with this. If you are using the spirometer at home, follow these instructions: [place] IF:   You are having difficultly using the spirometer.  You have trouble using the spirometer as often as instructed.  Your pain medication is not giving enough relief while using the spirometer.  You develop fever of 100.5 F (38.1 C) or higher. SEEK IMMEDIATE MEDICAL CARE IF:   You cough up bloody sputum that had not been present before.  You develop fever of 102 F (38.9 C) or greater.  You develop worsening pain at or near the incision site. MAKE SURE YOU:   Understand these instructions.  Will watch your condition.  Will get help right away if you are not doing well or get worse. Document Released: 12/10/2006 Document Revised: 10/22/2011 Document Reviewed: 02/10/2007 ExitCare Patient Information 2014 ExitCare, [place].   ________________________________________________________________________  WHAT IS A BLOOD TRANSFUSION? Blood Transfusion Information  A transfusion is the replacement of blood or some of its parts. Blood is made up of multiple cells which provide different functions.  Red blood cells carry oxygen and are used for blood loss replacement.  White blood cells fight against infection.  Platelets control bleeding.  Plasma helps clot blood.  Other blood products are available for specialized needs, such as hemophilia or other clotting disorders. BEFORE THE TRANSFUSION  Who gives blood for transfusions?   Healthy volunteers who are fully evaluated to make sure their blood is safe. This is blood bank  blood. Transfusion therapy is the safest it has ever been in the practice of medicine. Before blood is taken from a donor, a complete history is taken to make sure that person has no history of diseases nor engages in risky social behavior (examples are intravenous drug use or sexual activity with multiple partners). The donor's travel history is screened to minimize risk of transmitting infections, such as malaria. The donated blood is tested for signs of infectious diseases, such as HIV and hepatitis. The blood is then tested to be sure it is compatible with you in order to minimize the chance of a transfusion reaction. If you or a relative donates blood, this is often done in anticipation of surgery and is not appropriate for emergency situations. It takes many days to process the donated blood. RISKS AND COMPLICATIONS Although transfusion therapy is very safe and saves many lives, the main dangers of transfusion include:   Getting an infectious disease.  Developing a transfusion reaction. This is an allergic reaction to something in the blood you were given. Every precaution is taken to prevent this. The decision to have a blood transfusion  has been considered carefully by your caregiver before blood is given. Blood is not given unless the benefits outweigh the risks. AFTER THE TRANSFUSION  Right after receiving a blood transfusion, you will usually feel much better and more energetic. This is especially true if your red blood cells have gotten low (anemic). The transfusion raises the level of the red blood cells which carry oxygen, and this usually causes an energy increase.  The nurse administering the transfusion will monitor you carefully for complications. HOME CARE INSTRUCTIONS  No special instructions are needed after a transfusion. You may find your energy is better. Speak with your caregiver about any limitations on activity for underlying diseases you may have. SEEK MEDICAL CARE IF:    Your condition is not improving after your transfusion.  You develop redness or irritation at the intravenous (IV) site. SEEK IMMEDIATE MEDICAL CARE IF:  Any of the following symptoms occur over the next 12 hours:  Shaking chills.  You have a temperature by mouth above 102 F (38.9 C), not controlled by medicine.  Chest, back, or muscle pain.  People around you feel you are not acting correctly or are confused.  Shortness of breath or difficulty breathing.  Dizziness and fainting.  You get a rash or develop hives.  You have a decrease in urine output.  Your urine turns a dark color or changes to pink, red, or brown. Any of the following symptoms occur over the next 10 days:  You have a temperature by mouth above 102 F (38.9 C), not controlled by medicine.  Shortness of breath.  Weakness after normal activity.  The white part of the eye turns yellow (jaundice).  You have a decrease in the amount of urine or are urinating less often.  Your urine turns a dark color or changes to pink, red, or brown. Document Released: 07/27/2000 Document Revised: 10/22/2011 Document Reviewed: 03/15/2008 ExitCare Patient Information 2014 ExitCare, [place].  _______________________________________________________________________   CLEAR LIQUID DIET   Foods Allowed                                                                     Foods Excluded  Coffee and tea, regular and decaf                             liquids that you cannot  Plain Jell-O in any flavor                                             see through such as: Fruit ices (not with fruit pulp)                                     milk, soups, orange juice  Iced Popsicles                                    All solid food Carbonated beverages, regular and diet  Cranberry, grape and apple juices Sports drinks like Gatorade Lightly seasoned clear broth or consume(fat free) Sugar, honey  syrup  Sample Menu Breakfast                                Lunch                                     Supper Cranberry juice                    Beef broth                            Chicken broth Jell-O                                     Grape juice                           Apple juice Coffee or tea                        Jell-O                                      Popsicle                                                Coffee or tea                        Coffee or tea  _____________________________________________________________________

## 2016-01-13 ENCOUNTER — Ambulatory Visit (HOSPITAL_COMMUNITY)
Admission: RE | Admit: 2016-01-13 | Discharge: 2016-01-14 | Disposition: A | Discharge status: Home / Self Care | Payer: 59 | Source: Ambulatory Visit | Attending: Specialist | Admitting: Specialist

## 2016-01-13 ENCOUNTER — Encounter (HOSPITAL_COMMUNITY): Payer: Self-pay | Admitting: *Deleted

## 2016-01-13 ENCOUNTER — Inpatient Hospital Stay (HOSPITAL_COMMUNITY): Payer: 59

## 2016-01-13 ENCOUNTER — Encounter (HOSPITAL_COMMUNITY)
Admission: RE | Disposition: A | Discharge status: Home / Self Care | Payer: Self-pay | Source: Ambulatory Visit | Attending: Specialist

## 2016-01-13 ENCOUNTER — Inpatient Hospital Stay (HOSPITAL_COMMUNITY): Payer: 59 | Admitting: Certified Registered Nurse Anesthetist

## 2016-01-13 ENCOUNTER — Ambulatory Visit: Payer: Self-pay | Admitting: Orthopedic Surgery

## 2016-01-13 DIAGNOSIS — M5416 Radiculopathy, lumbar region: Secondary | ICD-10-CM | POA: Insufficient documentation

## 2016-01-13 DIAGNOSIS — M21371 Foot drop, right foot: Secondary | ICD-10-CM | POA: Insufficient documentation

## 2016-01-13 DIAGNOSIS — M4806 Spinal stenosis, lumbar region: Secondary | ICD-10-CM | POA: Diagnosis not present

## 2016-01-13 DIAGNOSIS — M549 Dorsalgia, unspecified: Secondary | ICD-10-CM | POA: Diagnosis present

## 2016-01-13 DIAGNOSIS — M5127 Other intervertebral disc displacement, lumbosacral region: Secondary | ICD-10-CM | POA: Insufficient documentation

## 2016-01-13 DIAGNOSIS — M5126 Other intervertebral disc displacement, lumbar region: Secondary | ICD-10-CM

## 2016-01-13 DIAGNOSIS — M48061 Spinal stenosis, lumbar region without neurogenic claudication: Secondary | ICD-10-CM | POA: Diagnosis present

## 2016-01-13 DIAGNOSIS — E039 Hypothyroidism, unspecified: Secondary | ICD-10-CM | POA: Insufficient documentation

## 2016-01-13 DIAGNOSIS — Z419 Encounter for procedure for purposes other than remedying health state, unspecified: Secondary | ICD-10-CM

## 2016-01-13 DIAGNOSIS — M5417 Radiculopathy, lumbosacral region: Secondary | ICD-10-CM | POA: Diagnosis not present

## 2016-01-13 DIAGNOSIS — M4807 Spinal stenosis, lumbosacral region: Secondary | ICD-10-CM | POA: Insufficient documentation

## 2016-01-13 HISTORY — PX: LUMBAR LAMINECTOMY/DECOMPRESSION MICRODISCECTOMY: SHX5026

## 2016-01-13 SURGERY — LUMBAR LAMINECTOMY/DECOMPRESSION MICRODISCECTOMY 2 LEVELS
Anesthesia: General | Site: Back

## 2016-01-13 MED ORDER — MAGNESIUM CITRATE PO SOLN: 1.0000 | Freq: Once | ORAL | Status: DC | PRN

## 2016-01-13 MED ORDER — SUGAMMADEX SODIUM 200 MG/2ML IV SOLN
INTRAVENOUS | Status: DC | PRN
  Administered 2016-01-13: 200 mg via INTRAVENOUS

## 2016-01-13 MED ORDER — ACETAMINOPHEN 325 MG PO TABS: 650.0000 mg | ORAL_TABLET | ORAL | Status: DC | PRN

## 2016-01-13 MED ORDER — CEFAZOLIN SODIUM-DEXTROSE 2-4 GM/100ML-% IV SOLN
2.0000 g | INTRAVENOUS | Status: AC
  Administered 2016-01-13: 2 g via INTRAVENOUS

## 2016-01-13 MED ORDER — ROCURONIUM BROMIDE 100 MG/10ML IV SOLN
INTRAVENOUS | Status: DC | PRN
  Administered 2016-01-13 (×3): 10 mg via INTRAVENOUS
  Administered 2016-01-13 (×2): 5 mg via INTRAVENOUS
  Administered 2016-01-13: 50 mg via INTRAVENOUS

## 2016-01-13 MED ORDER — BUPIVACAINE-EPINEPHRINE (PF) 0.5% -1:200000 IJ SOLN
INTRAMUSCULAR | Status: AC
  Filled 2016-01-13: qty 30

## 2016-01-13 MED ORDER — ACETAMINOPHEN 650 MG RE SUPP: 650.0000 mg | RECTAL | Status: DC | PRN

## 2016-01-13 MED ORDER — METHOCARBAMOL 1000 MG/10ML IJ SOLN
500.0000 mg | Freq: Four times a day (QID) | INTRAVENOUS | Status: DC | PRN
  Administered 2016-01-13: 500 mg via INTRAVENOUS
  Filled 2016-01-13 (×2): qty 5
  Filled 2016-01-13: qty 550

## 2016-01-13 MED ORDER — FENTANYL CITRATE (PF) 250 MCG/5ML IJ SOLN
INTRAMUSCULAR | Status: AC
  Filled 2016-01-13: qty 5

## 2016-01-13 MED ORDER — HYDROCODONE-ACETAMINOPHEN 5-325 MG PO TABS
1.0000 | ORAL_TABLET | ORAL | Status: DC | PRN
  Administered 2016-01-13 – 2016-01-14 (×3): 2 via ORAL
  Filled 2016-01-13 (×3): qty 2

## 2016-01-13 MED ORDER — ONDANSETRON HCL 4 MG/2ML IJ SOLN
INTRAMUSCULAR | Status: DC | PRN
  Administered 2016-01-13: 4 mg via INTRAVENOUS

## 2016-01-13 MED ORDER — METOCLOPRAMIDE HCL 5 MG/ML IJ SOLN
INTRAMUSCULAR | Status: DC | PRN
  Administered 2016-01-13: 10 mg via INTRAVENOUS

## 2016-01-13 MED ORDER — POLYETHYLENE GLYCOL 3350 17 G PO PACK: 17.0000 g | PACK | Freq: Every day | ORAL | Status: DC | PRN

## 2016-01-13 MED ORDER — PHENYLEPHRINE 40 MCG/ML (10ML) SYRINGE FOR IV PUSH (FOR BLOOD PRESSURE SUPPORT)
PREFILLED_SYRINGE | INTRAVENOUS | Status: DC | PRN
Start: 2016-01-13 — End: 2016-01-13
  Administered 2016-01-13 (×8): 80 ug via INTRAVENOUS

## 2016-01-13 MED ORDER — PROMETHAZINE HCL 25 MG/ML IJ SOLN: 6.2500 mg | INTRAMUSCULAR | Status: DC | PRN

## 2016-01-13 MED ORDER — PROPOFOL 10 MG/ML IV BOLUS
INTRAVENOUS | Status: DC | PRN
  Administered 2016-01-13: 150 mg via INTRAVENOUS

## 2016-01-13 MED ORDER — BISACODYL 5 MG PO TBEC: 5.0000 mg | DELAYED_RELEASE_TABLET | Freq: Every day | ORAL | Status: DC | PRN

## 2016-01-13 MED ORDER — OXYCODONE-ACETAMINOPHEN 5-325 MG PO TABS: 1.0000 | ORAL_TABLET | ORAL | Status: DC | PRN

## 2016-01-13 MED ORDER — FENTANYL CITRATE (PF) 100 MCG/2ML IJ SOLN
INTRAMUSCULAR | Status: DC | PRN
  Administered 2016-01-13: 50 ug via INTRAVENOUS
  Administered 2016-01-13: 100 ug via INTRAVENOUS
  Administered 2016-01-13 (×2): 50 ug via INTRAVENOUS

## 2016-01-13 MED ORDER — DOCUSATE SODIUM 100 MG PO CAPS
100.0000 mg | ORAL_CAPSULE | Freq: Two times a day (BID) | ORAL | Status: DC
  Administered 2016-01-13 – 2016-01-14 (×2): 100 mg via ORAL
  Filled 2016-01-13 (×3): qty 1

## 2016-01-13 MED ORDER — MENTHOL 3 MG MT LOZG
1.0000 | LOZENGE | OROMUCOSAL | Status: DC | PRN
Start: 2016-01-13 — End: 2016-01-14
  Filled 2016-01-13: qty 9

## 2016-01-13 MED ORDER — LACTATED RINGERS IV SOLN: INTRAVENOUS | Status: DC

## 2016-01-13 MED ORDER — MEPERIDINE HCL 50 MG/ML IJ SOLN: 6.2500 mg | INTRAMUSCULAR | Status: DC | PRN

## 2016-01-13 MED ORDER — SODIUM CHLORIDE 0.9 % IR SOLN
Status: AC
  Filled 2016-01-13: qty 1

## 2016-01-13 MED ORDER — CEFAZOLIN SODIUM-DEXTROSE 2-4 GM/100ML-% IV SOLN
2.0000 g | Freq: Three times a day (TID) | INTRAVENOUS | Status: AC
  Administered 2016-01-13 – 2016-01-14 (×2): 2 g via INTRAVENOUS
  Filled 2016-01-13 (×2): qty 100

## 2016-01-13 MED ORDER — LACTATED RINGERS IV SOLN
INTRAVENOUS | Status: DC
  Administered 2016-01-13 (×2): via INTRAVENOUS

## 2016-01-13 MED ORDER — LIDOCAINE HCL (CARDIAC) 20 MG/ML IV SOLN
INTRAVENOUS | Status: DC | PRN
  Administered 2016-01-13: 50 mg via INTRAVENOUS

## 2016-01-13 MED ORDER — METHOCARBAMOL 500 MG PO TABS: 500.0000 mg | ORAL_TABLET | Freq: Four times a day (QID) | ORAL | Status: DC | PRN

## 2016-01-13 MED ORDER — HYDROMORPHONE HCL 1 MG/ML IJ SOLN
0.5000 mg | INTRAMUSCULAR | Status: DC | PRN
  Administered 2016-01-13: 1 mg via INTRAVENOUS
  Filled 2016-01-13: qty 1

## 2016-01-13 MED ORDER — HYDROMORPHONE HCL 1 MG/ML IJ SOLN
INTRAMUSCULAR | Status: AC
  Filled 2016-01-13: qty 1

## 2016-01-13 MED ORDER — SODIUM CHLORIDE 0.9 % IR SOLN
Status: DC | PRN
  Administered 2016-01-13: 500 mL

## 2016-01-13 MED ORDER — OXYCODONE-ACETAMINOPHEN 5-325 MG PO TABS
1.0000 | ORAL_TABLET | ORAL | Status: DC | PRN
  Administered 2016-01-14: 2 via ORAL
  Filled 2016-01-13: qty 2

## 2016-01-13 MED ORDER — MIDAZOLAM HCL 2 MG/2ML IJ SOLN
INTRAMUSCULAR | Status: AC
  Filled 2016-01-13: qty 2

## 2016-01-13 MED ORDER — ONDANSETRON HCL 4 MG/2ML IJ SOLN: 4.0000 mg | INTRAMUSCULAR | Status: DC | PRN

## 2016-01-13 MED ORDER — DOCUSATE SODIUM 100 MG PO CAPS: 100.0000 mg | ORAL_CAPSULE | Freq: Two times a day (BID) | ORAL | Status: DC

## 2016-01-13 MED ORDER — PHENOL 1.4 % MT LIQD: 1.0000 | OROMUCOSAL | Status: DC | PRN

## 2016-01-13 MED ORDER — PROPOFOL 10 MG/ML IV BOLUS
INTRAVENOUS | Status: AC
  Filled 2016-01-13: qty 40

## 2016-01-13 MED ORDER — HYDROMORPHONE HCL 1 MG/ML IJ SOLN
0.2500 mg | INTRAMUSCULAR | Status: DC | PRN
  Administered 2016-01-13 (×2): 0.25 mg via INTRAVENOUS
  Administered 2016-01-13: 0.5 mg via INTRAVENOUS

## 2016-01-13 MED ORDER — KCL IN DEXTROSE-NACL 20-5-0.45 MEQ/L-%-% IV SOLN
INTRAVENOUS | Status: DC
  Administered 2016-01-13: 21:00:00 via INTRAVENOUS
  Filled 2016-01-13 (×2): qty 1000

## 2016-01-13 MED ORDER — GABAPENTIN 300 MG PO CAPS
300.0000 mg | ORAL_CAPSULE | Freq: Three times a day (TID) | ORAL | Status: DC | PRN
  Filled 2016-01-13: qty 1

## 2016-01-13 MED ORDER — BUPIVACAINE-EPINEPHRINE (PF) 0.5% -1:200000 IJ SOLN
INTRAMUSCULAR | Status: DC | PRN
  Administered 2016-01-13: 17 mL

## 2016-01-13 MED ORDER — PHENYLEPHRINE 40 MCG/ML (10ML) SYRINGE FOR IV PUSH (FOR BLOOD PRESSURE SUPPORT)
PREFILLED_SYRINGE | INTRAVENOUS | Status: AC
  Filled 2016-01-13: qty 20

## 2016-01-13 MED ORDER — PHENYLEPHRINE 40 MCG/ML (10ML) SYRINGE FOR IV PUSH (FOR BLOOD PRESSURE SUPPORT)
PREFILLED_SYRINGE | INTRAVENOUS | Status: AC
  Filled 2016-01-13: qty 10

## 2016-01-13 MED ORDER — MIDAZOLAM HCL 5 MG/5ML IJ SOLN
INTRAMUSCULAR | Status: DC | PRN
  Administered 2016-01-13: 2 mg via INTRAVENOUS

## 2016-01-13 MED ORDER — ALUM & MAG HYDROXIDE-SIMETH 200-200-20 MG/5ML PO SUSP: 30.0000 mL | Freq: Four times a day (QID) | ORAL | Status: DC | PRN

## 2016-01-13 MED ORDER — DEXAMETHASONE SODIUM PHOSPHATE 10 MG/ML IJ SOLN
INTRAMUSCULAR | Status: DC | PRN
  Administered 2016-01-13: 10 mg via INTRAVENOUS

## 2016-01-13 MED ORDER — LEVOTHYROXINE SODIUM 100 MCG PO TABS
100.0000 ug | ORAL_TABLET | Freq: Every day | ORAL | Status: DC
  Filled 2016-01-13 (×3): qty 1

## 2016-01-13 MED ORDER — ACETAMINOPHEN 10 MG/ML IV SOLN: 1000.0000 mg | Freq: Once | INTRAVENOUS | Status: DC

## 2016-01-13 MED ORDER — SUGAMMADEX SODIUM 200 MG/2ML IV SOLN
INTRAVENOUS | Status: AC
  Filled 2016-01-13: qty 2

## 2016-01-13 MED ORDER — CEFAZOLIN SODIUM-DEXTROSE 2-4 GM/100ML-% IV SOLN
INTRAVENOUS | Status: AC
  Filled 2016-01-13: qty 100

## 2016-01-13 MED ORDER — RISAQUAD PO CAPS
1.0000 | ORAL_CAPSULE | Freq: Every day | ORAL | Status: DC
  Administered 2016-01-14: 1 via ORAL
  Filled 2016-01-13 (×2): qty 1

## 2016-01-13 SURGICAL SUPPLY — 52 items
AGENT HMST SPONGE THK3/8 (HEMOSTASIS) ×1
BAG SPEC THK2 15X12 ZIP CLS (MISCELLANEOUS)
BAG ZIPLOCK 12X15 (MISCELLANEOUS) IMPLANT
CLEANER TIP ELECTROSURG 2X2 (MISCELLANEOUS) ×3 IMPLANT
CLOSURE WOUND 1/2 X4 (GAUZE/BANDAGES/DRESSINGS)
CLOTH 2% CHLOROHEXIDINE 3PK (PERSONAL CARE ITEMS) ×3 IMPLANT
DRAPE MICROSCOPE LEICA (MISCELLANEOUS) ×3 IMPLANT
DRAPE SHEET LG 3/4 BI-LAMINATE (DRAPES) ×2 IMPLANT
DRAPE SURG 17X11 SM STRL (DRAPES) ×3 IMPLANT
DRAPE UTILITY XL STRL (DRAPES) ×3 IMPLANT
DRSG AQUACEL AG ADV 3.5X 4 (GAUZE/BANDAGES/DRESSINGS) IMPLANT
DRSG AQUACEL AG ADV 3.5X 6 (GAUZE/BANDAGES/DRESSINGS) ×2 IMPLANT
DURAPREP 26ML APPLICATOR (WOUND CARE) ×3 IMPLANT
DURASEAL SPINE SEALANT 3ML (MISCELLANEOUS) IMPLANT
ELECT BLADE TIP CTD 4 INCH (ELECTRODE) ×3 IMPLANT
ELECT REM PT RETURN 9FT ADLT (ELECTROSURGICAL) ×3
ELECTRODE REM PT RTRN 9FT ADLT (ELECTROSURGICAL) ×1 IMPLANT
GLOVE BIOGEL PI IND STRL 7.0 (GLOVE) ×1 IMPLANT
GLOVE BIOGEL PI INDICATOR 7.0 (GLOVE) ×2
GLOVE SURG SS PI 7.0 STRL IVOR (GLOVE) ×3 IMPLANT
GLOVE SURG SS PI 7.5 STRL IVOR (GLOVE) ×3 IMPLANT
GLOVE SURG SS PI 8.0 STRL IVOR (GLOVE) ×6 IMPLANT
GOWN STRL REUS W/TWL XL LVL3 (GOWN DISPOSABLE) ×6 IMPLANT
HEMOSTAT SPONGE AVITENE ULTRA (HEMOSTASIS) ×2 IMPLANT
IV CATH 14GX2 1/4 (CATHETERS) IMPLANT
KIT BASIN OR (CUSTOM PROCEDURE TRAY) ×3 IMPLANT
KIT POSITIONING SURG ANDREWS (MISCELLANEOUS) ×3 IMPLANT
MANIFOLD NEPTUNE II (INSTRUMENTS) ×3 IMPLANT
MARKER SKIN DUAL TIP RULER LAB (MISCELLANEOUS) ×3 IMPLANT
NDL SPNL 18GX3.5 QUINCKE PK (NEEDLE) ×2 IMPLANT
NEEDLE SPNL 18GX3.5 QUINCKE PK (NEEDLE) ×6 IMPLANT
PACK LAMINECTOMY ORTHO (CUSTOM PROCEDURE TRAY) ×3 IMPLANT
PATTIES SURGICAL .5 X.5 (GAUZE/BANDAGES/DRESSINGS) ×3 IMPLANT
PATTIES SURGICAL .75X.75 (GAUZE/BANDAGES/DRESSINGS) ×3 IMPLANT
PATTIES SURGICAL 1X1 (DISPOSABLE) ×3 IMPLANT
RUBBERBAND STERILE (MISCELLANEOUS) ×3 IMPLANT
SPONGE LAP 4X18 X RAY DECT (DISPOSABLE) IMPLANT
SPONGE SURGIFOAM ABS GEL 100 (HEMOSTASIS) ×3 IMPLANT
STAPLER VISISTAT (STAPLE) IMPLANT
STRIP CLOSURE SKIN 1/2X4 (GAUZE/BANDAGES/DRESSINGS) IMPLANT
SUT NURALON 4 0 TR CR/8 (SUTURE) IMPLANT
SUT PROLENE 3 0 PS 2 (SUTURE) ×3 IMPLANT
SUT VIC AB 1 CT1 27 (SUTURE)
SUT VIC AB 1 CT1 27XBRD ANTBC (SUTURE) IMPLANT
SUT VIC AB 1-0 CT2 27 (SUTURE) ×3 IMPLANT
SUT VIC AB 2-0 CT1 27 (SUTURE)
SUT VIC AB 2-0 CT1 TAPERPNT 27 (SUTURE) IMPLANT
SUT VIC AB 2-0 CT2 27 (SUTURE) ×4 IMPLANT
SYR 3ML LL SCALE MARK (SYRINGE) IMPLANT
TOWEL OR 17X26 10 PK STRL BLUE (TOWEL DISPOSABLE) ×3 IMPLANT
TOWEL OR NON WOVEN STRL DISP B (DISPOSABLE) ×3 IMPLANT
YANKAUER SUCT BULB TIP NO VENT (SUCTIONS) ×3 IMPLANT

## 2016-01-13 NOTE — Transfer of Care (Signed)
Immediate Anesthesia Transfer of Care Note  Patient: [name]  Procedure(s) Performed: Procedure(s): MICRO LUMBAR DECOMPRESSION L5-S1, L4-L5 (N/A)  Patient Location: PACU  Anesthesia Type:General  Level of Consciousness: awake and alert   Airway & Oxygen Therapy: Patient Spontanous Breathing and Patient connected to face mask oxygen  Post-op Assessment: Report given to RN and Post -op Vital signs reviewed and stable  Post vital signs: Reviewed and stable  Last Vitals:  Filed Vitals:   01/13/16 1408 01/13/16 1845  BP: 114/72   Pulse: 78   Temp: 36.4 C 36.9 C  Resp: 16     Last Pain:  Filed Vitals:   01/13/16 1848  PainSc: 4          Complications: No apparent anesthesia complications

## 2016-01-13 NOTE — Anesthesia Preprocedure Evaluation (Addendum)
Anesthesia Evaluation  Patient identified by MRN, date of birth, ID band Patient awake    Reviewed: Allergy & Precautions, NPO status , Patient's Chart, lab work & pertinent test results  Airway Mallampati: II  TM Distance: >3 FB Neck ROM: Full    Dental  (+) Teeth Intact   Pulmonary neg pulmonary ROS,    breath sounds clear to auscultation       Cardiovascular negative cardio ROS   Rhythm:Regular Rate:Normal     Neuro/Psych PSYCHIATRIC DISORDERS Depression  Neuromuscular disease    GI/Hepatic negative GI ROS, Neg liver ROS,   Endo/Other  Hypothyroidism   Renal/GU negative Renal ROS  negative genitourinary   Musculoskeletal negative musculoskeletal ROS (+)   Abdominal   Peds negative pediatric ROS (+)  Hematology negative hematology ROS (+)   Anesthesia Other Findings   Reproductive/Obstetrics negative OB ROS                            Lab Results  Component Value Date   WBC 8.1 01/07/2016   HGB 13.3 01/07/2016   HCT 39.6 01/07/2016   MCV 94.1 01/07/2016   PLT 236 01/07/2016   Lab Results  Component Value Date   CREATININE 0.72 01/07/2016   BUN 20 01/07/2016   NA 135 01/07/2016   K 4.6 01/07/2016   CL 104 01/07/2016   CO2 23 01/07/2016   No results found for: INR, PROTIME  02/2015 EKG: sinus bradycardia.   Anesthesia Physical Anesthesia Plan  ASA: II  Anesthesia Plan: General   Post-op Pain Management:    Induction: Intravenous  Airway Management Planned: Oral ETT  Additional Equipment:   Intra-op Plan:   Post-operative Plan: Extubation in OR  Informed Consent: I have reviewed the patients History and Physical, chart, labs and discussed the procedure including the risks, benefits and alternatives for the proposed anesthesia with the patient or authorized representative who has indicated his/her understanding and acceptance.   Dental advisory given  Plan  Discussed with: CRNA  Anesthesia Plan Comments:         Anesthesia Quick Evaluation

## 2016-01-13 NOTE — Brief Op Note (Signed)
01/13/2016  6:26 PM  PATIENT:  [name]  45 y.o. female  PRE-OPERATIVE DIAGNOSIS:  HNP L5-S1, L4-L5  POST-OPERATIVE DIAGNOSIS:  HNP L5-S1, L4-L5  PROCEDURE:  Procedure(s): MICRO LUMBAR DECOMPRESSION L5-S1, L4-L5 (N/A)  SURGEON:  Surgeon(s) and Role:    * Susa Day, MD - Primary  PHYSICIAN ASSISTANT:   ASSISTANTS: [place]   ANESTHESIA:   general  EBL:  Total I/O In: 1600 [I.V.:1600] Out: 500 [Urine:400; Blood:100]  BLOOD ADMINISTERED:none  DRAINS: none   LOCAL MEDICATIONS USED:  MARCAINE     SPECIMEN:  Source of Specimen:  L5S1  DISPOSITION OF SPECIMEN:  PATHOLOGY  COUNTS:  YES  TOURNIQUET:  * No tourniquets in log *  DICTATION: .Other Dictation: Dictation Number   [id number]   PLAN OF CARE: Admit for overnight observation  PATIENT DISPOSITION:  PACU - hemodynamically stable.   Delay start of Pharmacological VTE agent (>24hrs) due to surgical blood loss or risk of bleeding: yes
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
## 2016-01-13 NOTE — Discharge Instructions (Signed)

## 2016-01-13 NOTE — Anesthesia Procedure Notes (Signed)
Procedure Name: Intubation Date/Time: 01/13/2016 3:58 PM Performed by: Reema Chick, Virgel Gess Pre-anesthesia Checklist: Patient identified, Emergency Drugs available, Suction available, Patient being monitored and Timeout performed Patient Re-evaluated:Patient Re-evaluated prior to inductionOxygen Delivery Method: Circle system utilized Preoxygenation: Pre-oxygenation with 100% oxygen Intubation Type: IV induction Ventilation: Mask ventilation without difficulty Laryngoscope Size: Mac and 4 Grade View: Grade I Tube type: Oral Tube size: 7.5 mm Number of attempts: 1 Airway Equipment and Method: Stylet Placement Confirmation: ETT inserted through vocal cords under direct vision,  positive ETCO2,  CO2 detector and breath sounds checked- equal and bilateral Secured at: 21 cm Tube secured with: Tape Dental Injury: Teeth and Oropharynx as per pre-operative assessment

## 2016-01-14 DIAGNOSIS — M4806 Spinal stenosis, lumbar region: Secondary | ICD-10-CM | POA: Diagnosis not present

## 2016-01-14 LAB — CBC
HEMATOCRIT: 36.5 % (ref 36.0–46.0)
HEMOGLOBIN: 12.3 g/dL (ref 12.0–15.0)
MCH: 31.8 pg (ref 26.0–34.0)
MCHC: 33.7 g/dL (ref 30.0–36.0)
MCV: 94.3 fL (ref 78.0–100.0)
Platelets: 256 10*3/uL (ref 150–400)
RBC: 3.87 MIL/uL (ref 3.87–5.11)
RDW: 14 % (ref 11.5–15.5)
WBC: 8.3 10*3/uL (ref 4.0–10.5)

## 2016-01-14 NOTE — Progress Notes (Signed)
Pt discharged with dc paper work and prescriptions; escorted to lobby via wheelchair by RN; family with patient to drive patient home

## 2016-01-14 NOTE — Care Management (Signed)
PT informed CM patient needs a RW while PT was walking with patient in the hallway. Merry Proud at [facility] notified of the DME referral. Venita Sheffield RN BSN CCM

## 2016-01-14 NOTE — Progress Notes (Signed)
   Subjective: 1 Day Post-Op Procedure(s) (LRB): MICRO LUMBAR DECOMPRESSION L5-S1, L4-L5 (N/A) Patient reports pain as mild.   Patient seen in rounds for Dr. Tonita Cong. She is doing [facility] better since surgery.  Her pain is greatly improved and she already has a lot of her strength returning to her foot and leg.  She is eating breakfast okay and wants to go home today. Patient is well, but has had some minor complaints of pain in the back, requiring pain medications Patient is ready to go home later today.  Objective: Vital signs in last 24 hours: Temp:  [97.5 F (36.4 C)-98.5 F (36.9 C)] 97.9 F (36.6 C) (06/03 0451) Pulse Rate:  [73-81] 76 (06/03 0451) Resp:  [8-16] 15 (06/03 0451) BP: (110-121)/(43-72) 110/60 mmHg (06/03 0451) SpO2:  [97 %-100 %] 100 % (06/03 0451) Weight:  [87.091 kg (192 lb)] 87.091 kg (192 lb) (06/02 1434)  Intake/Output from previous day:  Intake/Output Summary (Last 24 hours) at 01/14/16 0836 Last data filed at 01/14/16 0600  Gross per 24 hour  Intake   2800 ml  Output   2150 ml  Net    650 ml    Labs:  Recent Labs  01/14/16 0507  HGB 12.3    Recent Labs  01/14/16 0507  WBC 8.3  RBC 3.87  HCT 36.5  PLT 256   No results for input(s): NA, K, CL, CO2, BUN, CREATININE, GLUCOSE, CALCIUM in the last 72 hours. No results for input(s): LABPT, INR in the last 72 hours.  EXAM: General - Patient is Alert, Appropriate and Oriented Extremity - Neurologically intact Neurovascular intact Sensation intact distally Intact pulses distally Dorsiflexion/Plantar flexion intact Motor Function - intact, moving foot and toes well on exam.  Good strength to right foot EHL, right foot dorsiflexion, right foot plantarflexion. Sensation is less on the right foot as compared to the left foot (but much improved as compared to before surgery as per the patient).  Assessment/Plan: 1 Day Post-Op Procedure(s) (LRB): MICRO LUMBAR DECOMPRESSION L5-S1, L4-L5  (N/A) Procedure(s) (LRB): MICRO LUMBAR DECOMPRESSION L5-S1, L4-L5 (N/A) Past Medical History  Diagnosis Date  . Abdominal pain, epigastric 06/19/2010  . DEPRESSION 05/02/2010  . HYPERTHYROIDISM 05/02/2010  . HYPOTHYROIDISM 05/02/2010  . INSOMNIA 05/02/2010  . NAUSEA 06/19/2010  . TRANSAMINASES, SERUM, ELEVATED 05/02/2010  . Allergy   . Cancer ([place])     thyroid cancer    Principal Problem:   HNP (herniated nucleus pulposus), lumbar Active Problems:   Spinal stenosis of lumbar region  Estimated body mass index is 29.2 kg/(m^2) as calculated from the following:   Height as of this encounter: 5\' 8"  (1.727 m).   Weight as of this encounter: 87.091 kg (192 lb). Up with therapy Diet - Cardiac diet Follow up - in 2 weeks Activity - WBAT Disposition - Home Condition Upon Discharge -improving D/C Meds - See DC Summary  Arlee Muslim, PA-C Orthopaedic Surgery 01/14/2016, 8:36 AM

## 2016-01-14 NOTE — Evaluation (Signed)
Physical Therapy Evaluation Patient Details Name: [name] MRN: [id number] DOB: 11/20/1970 Today's Date: 01/14/2016   History of Present Illness  s/p MICRO LUMBAR DECOMPRESSION L5-S1, L4-L5  Clinical Impression  The patient is progressing well. Ready for DC today. Needs RW.    Follow Up Recommendations No PT follow up    Equipment Recommendations  Rolling walker with 5" wheels    Recommendations for Other Services       Precautions / Restrictions Precautions Precautions: Back;Fall Precaution Booklet Issued: Yes (comment) Precaution Comments: handout given and educated on back precautions, instructed in  pillows under elbows sitting. Restrictions Weight Bearing Restrictions: No      Mobility  Bed Mobility Overal bed mobility: Needs Assistance Bed Mobility: Sidelying to Sit;Sit to Sidelying   Sidelying to sit: Supervision     Sit to sidelying: Supervision General bed mobility comments: practiced on tall bed height and did well, instructed to place RW beside the bed  to use as a rail for bed mobility  Transfers Overall transfer level: Needs assistance Equipment used: Rolling walker (2 wheeled) Transfers: Sit to/from Stand Sit to Stand: Supervision         General transfer comment: cues for technique  Ambulation/Gait Ambulation/Gait assistance: Supervision Ambulation Distance (Feet): 200 Feet Assistive device: Rolling walker (2 wheeled) Gait Pattern/deviations: WFL(Within Functional Limits)        Stairs Stairs: Yes Stairs assistance: Min assist Stair Management: No rails;Step to pattern;Backwards;With walker Number of Stairs: 2 General stair comments: practiced x 2  Wheelchair Mobility    Modified Rankin (Stroke Patients Only)       Balance                                             Pertinent Vitals/Pain Pain Assessment: 0-10 Pain Score: 2  Pain Location: back and Right leg Pain Descriptors / Indicators:  Sore;Tingling Pain Intervention(s): Limited activity within patient's tolerance;Monitored during session;Premedicated before session;Repositioned    Home Living Family/patient expects to be discharged to:: Private residence Living Arrangements: Spouse/significant other Available Help at Discharge: Family (spouse; mother to assist as well) Type of Home: House Home Access: Stairs to enter Entrance Stairs-Rails: None Technical brewer of Steps: 2 Home Layout: One level Home Equipment: Shower seat - built in      Prior Function Level of Independence: Independent               Journalist, newspaper        Extremity/Trunk Assessment   Upper Extremity Assessment: Overall WFL for tasks assessed           Lower Extremity Assessment: RLE deficits/detail RLE Deficits / Details: WFL, has radiating pain    Cervical / Trunk Assessment: Normal  Communication   Communication: No difficulties  Cognition Arousal/Alertness: Awake/alert Behavior During Therapy: WFL for tasks assessed/performed Overall Cognitive Status: Within Functional Limits for tasks assessed                      General Comments      Exercises        Assessment/Plan    PT Assessment Patent does not need any further PT services  PT Diagnosis Difficulty walking   PT Problem List    PT Treatment Interventions     PT Goals (Current goals can be found in the Care Plan section) Acute Rehab PT Goals  Patient Stated Goal: home today PT Goal Formulation: All assessment and education complete, DC therapy    Frequency     Barriers to discharge        Co-evaluation               End of Session   Activity Tolerance: Patient tolerated treatment well Patient left: in chair;with call bell/phone within reach;with family/visitor present Nurse Communication: Mobility status         Time: [id number] PT Time Calculation (min) (ACUTE ONLY): 25 min   Charges:   PT Evaluation $PT Eval Low  Complexity: 1 Procedure PT Treatments $Gait Training: 8-22 mins   PT G Codes:        Claretha Cooper 01/14/2016, 10:31 AM Tresa Endo PT [phone]

## 2016-01-14 NOTE — Op Note (Signed)
NAMEJENAE, [name]               ACCOUNT NO.:  [id number]  MEDICAL RECORD NO.:  [id number]  LOCATION:  [place]                         FACILITY:  [facility]  PHYSICIAN:  Susa Day, M.D.    DATE OF BIRTH:  Mar 09, 1971  DATE OF PROCEDURE:  01/13/2016 DATE OF DISCHARGE:                              OPERATIVE REPORT   PREOPERATIVE DIAGNOSIS:  Herniated nucleus pulposus spinal stenosis at L5-S1, L4-L5.  POSTOPERATIVE DIAGNOSIS:  Herniated nucleus pulposus spinal stenosis at L5-S1, L4-L5.  PROCEDURE PERFORMED: 1. Microlumbar decompression, L5-S1. 2. Microlumbar decompression, L4-L5. 3. Microdiskectomy, L5-S1. 4. Foraminotomies, L5-S1.  ANESTHESIA:  General.  ASSISTANT:  Lacie Draft, PA  SPECIMEN:  L5-S1 disk to Pathology.  HISTORY:  A 45 year old female with an acute onset of right lower extremity radicular pain at L5-S1 nerve root distribution and possible intermittent L4 nerve root distribution.  She has extruded fragment at L5-S1, compressing the L5 and S1 nerve roots.  She also had a disk and lateral recess stenosis at L4-L5 laterally disk protruding.  Due to the profound neurologic deficit, she was indicated for urgent lumbar decompression at L5-S1 and L4-L5.  Risks and benefits were discussed including bleeding, infection, damage to the vas structures, DVT, PE, anesthetic complications, no change in symptoms, worsening symptoms, etc.  TECHNIQUE:  The patient was in supine position.  After induction of adequate general anesthesia, 2 g of Kefzol, placed prone on the Hersey frame.  All bony prominences were well padded.  Lumbar region was prepped and draped in usual sterile fashion.  Two 18-gauge spinal needles were utilized to localize the L4-L5 and L5-S1 interspace confirmed with x-ray.  Incision was made from the spinous process of S1 to the midportion of L4-L5.  Subcutaneous tissue was dissected. Electrocautery was utilized to achieve hemostasis.  A 0.25%  Marcaine with epinephrine was infiltrated in the paraspinous musculature. Dorsolumbar fascia was divided in line with skin incision.  We elevated the paraspinous musculature from L5-S1 and L4-L5.  McCullough retractor was placed and operating microscope was draped and brought on the surgical field.  The first operating microscope, however, noted the absence of focusing capabilities for multiple attempts to correct this. We decided this would require an alternative microscope.  This microscope was then removed.  An alternative-like microscope was draped and brought into the surgical field.  We copiously irrigated the wing. We identified the interlaminar space at L5-S1.  We detached ligamentum flavum from the cephalad edge of S1 utilizing straight curette.  The S1 nerve root was noted immediately below the ligamentum flavum and compressed into the lateral recess in the foramen.  This was nonmobile. We decompressed the lateral recess to the medial border of the pedicle, and then gently protected the nerve root with a Penfield and performed a foraminotomy of S1 with a 2 mm Kerrison.  A generous foraminotomy identifying the S1 nerve root.  We continued laterally and decompressed the lateral recess up to the disk space.  Noted in the axilla of the S1 nerve root displacing it laterally was a herniated disk free fragment. This was mobilized with a nerve hook and retrieved with a micropituitary.  In addition, there was an additional fragment that had  migrated cephalad beneath the S1 nerve root.  This was mobilized from the lateral aspect of the S1 nerve root, and we retrieved with a micropituitary.  This was centimeter in diameter.  We performed a hemilaminotomy of the caudad edge of L5 protecting the thecal sac and the root.  We identified the disk space.  We gently mobilized the S1 nerve root medially, was able to do so following retrieval of the 2 free fragments.  A third large free fragment  was noted at the disk space extending caudad.  This was mobilized with the nerve root and a micropituitary.  Then, I performed an annulotomy at the disk space.  The copious portion of the disk material was removed from the disk space with a straight micropituitary.  This was further mobilized with an Epstein preserving the endplates.  We copiously irrigated the disk space with antibiotic irrigation.  We then retrieved additional small fragment with a micropituitary.  The patient had also an extensive epidural venous plexus and tethering vein.  This was cauterized and mobilized.  There was epidural venous bleeding.  This was cauterized, and then we packed it with Avitene Gelfoam.  Given the patient's clinical exam and that noted on the MRI, we then also identified the space at L4-L5, detached the ligamentum flavum from the cephalad edge of L5.  Placed a Penfield below the Bonesteel retractor below the lamina protecting the root.  We performed a generous foraminotomy of L5.  There was lateral recess stenosis noted. The L5 nerve root after performing the foraminotomy was then gently mobilized medially, decompressed the lateral recess, identified the disk space.  There was extensive epidural venous plexus was cauterized and mobilized.  There was a bulging of the disk.  We probed the L4-L5 disk and out into the foramen of L4 and a neural probe passed freely up the foramen of L4 without neural tension noted.  We felt this would not require diskectomy at this level, just a decompression.  We copiously irrigated this and obtained a confirmatory radiograph with a Penfield at the disk space at L4-L5 and at L5-S1.  No active bleeding or CSF leakage noted at L4-L5 and returned to L5-S1 and noted some moderate epidural venous bleeding.  We, therefore, packed it with neuro patties and Avitene.  We waited a period of 5 minutes and then revisited the area. We used bone wax on the cancellous surfaces and  copiously irrigated the wound.  A small piece of the Avitene was placed in the foramen of S1. There was no evidence of active bleeding.  The S1 nerve root was fairly edematous, but there was 1 cm of excursion in the S1 nerve root via the pedicle without tension.  We checked again the axilla, the shoulder of the root, the foramen beneath thecal sac, and the foramen of S1 and L5. No residual disk herniations.  On neural compressive lesion, we copiously irrigated the wound.  No evidence of CSF leakage or active bleeding.  We removed the [facility] retractor.  No evidence of active bleeding.  The dorsolumbar fascia was reapproximated with #1 Vicryl, subcu with 2-0, and skin with Prolene.  Sterile dressing was applied. She was placed supine on the hospital bed, extubated without difficulty, and transported to the recovery room in satisfactory condition.  The patient tolerated the procedure well.  No complications.  Assistant, Lacie Draft, PA was used throughout the case for patient transport, gentle intermittent neural traction, and closure.  There was 100 mL loss.  Susa Day, M.D.     Geralynn Rile  D:  01/13/2016  T:  01/14/2016  Job:  [id number]

## 2016-01-14 NOTE — Op Note (Signed)
Yates, Kimberly               ACCOUNT NO.:  [id number]  MEDICAL RECORD NO.:  [id number]  LOCATION:  [place]                         FACILITY:  [facility]  PHYSICIAN:  Susa Day, M.D.    DATE OF BIRTH:  1970/12/05  DATE OF PROCEDURE:  01/13/2016 DATE OF DISCHARGE:                              OPERATIVE REPORT   PREOPERATIVE DIAGNOSIS:  Spinal stenosis, herniated nucleus pulposus at L4-5, L5-S1.  POSTOPERATIVE DIAGNOSIS:  Spinal stenosis, herniated nucleus pulposus at L4-5, L5-S1.  PROCEDURE PERFORMED: 1. Microlumbar decompression L4-5, right. 2. Micro lumbar decompression L5-S1, right. 3. Foraminotomies L5-S1, right. 4. Microdiskectomy 5-1, right. 5. Lysis of epidural venous plexus.  ANESTHESIA:  General.  ASSISTANT:  Lacie Draft, PA  PATHOLOGY:  Specimen to Pathology, L5-S1 disk.  HISTORY:  45, right lower extremity radicular pain, with profound footdrop, myotomal weakness, dermatomal dysesthesias, L5-S1 nerve root distribution, also had intermittent L4 symptoms.  She did have a small disk protrusion and lateral recess stenosis at 4-5, but predominantly extruded fragment at 5-1 compressing the 5 and S1 nerve roots, severe pain, myotomal weakness, dermatomal dysesthesias, she was indicated with footdrop, urgent decompression at 5-1 and at 4-5.  Risks and benefits were discussed including bleeding, infection, damage to neurovascular structures, DVT, PE, anesthetic complications, no change in symptoms, worsening symptoms, etc.  Again preoperatively, she had a complete footdrop.  TECHNIQUE:  Patient in supine position, after induction of adequate general anesthesia, 2 g Kefzol, placed prone on the Glasco frame.  All bony prominences were well padded.  Lumbar region was prepped and draped in usual sterile fashion.  Two 18-gauge spinal needle was utilized to localize the 4-5, 5-1 interspace confirmed with x-ray.  Incision was made from the spinous process 4-S1.   Subcutaneous tissue was dissected with electrocautery, was utilized to achieve hemostasis.  A 0.25% Marcaine with epinephrine was infiltrated in paraspinous musculature. Dorsolumbar fascia divided in line with the skin incision.  Paraspinous muscle elevated from lamina 4-5 and 5-1.  McCulloch retractors placed. Operating microscope draped and brought on the surgical field.  However, it was noted that they utilized the microscope, had an optical focusing mechanism which was not operating correctly.  We therefore had to remove this microscope and utilized a different microscope.  This was draped again and brought in the sterile surgical field.  We copiously irrigated the wound, identified the disk space at 4-5 and at 5-1.  Detached the ligamentum flavum from the cephalad edge of S1 utilizing micro curette. The S1 nerve root was noted medially below the S1 nerve root.  S1 lamina protecting the root.  We performed a generous foraminotomy with a 2 mm Kerrison, first laterally decompressing lateral recess of the medial border of the pedicle performing a foraminotomy of S1.  The S1 nerve root was severely compressed into the lateral recess and the foramen noted was a free fragment in the axilla of the root.  This was gently mobilized with a nerve hook, retrieved with a micropituitary large fragment, 1 x 1 cm was retrieved.  There was an extensive epidural venous plexus noted as well.  I was then able to gently mobilize the S1 nerve root medially and additional fragment  underneath the shoulder of the root was mobilized and retrieved, again a large fragment was noted. We then were able to retract the S1 nerve root back to the disk space at 5-1 and a 3rd fragment just caudad to the disk space, this was retrieved, then removed.  We then entered the disk space after in an annulotomy was performed and copious portion of disk material was removed from this space with a pituitary, this was irrigated  with catheter irrigation, antibiotic.  Additional fragments retrieved. Following this, we explored beneath thecal sac.  Both foramen and a foraminotomy of 5 was performed.  There was no residual disk herniation noted.  There was an extensive epidural venous plexus again which was cauterized and lysed.  We placed bone wax on the cancellous surfaces and packed Avitene into the lateral recess and then we moved to the level of 4-5.  In a similar fashion, we identified the interlaminar space, detached ligamentum flavum from the cephalad edge of 5, protected the 5 root, performed a foraminotomy of 5 and gently mobilized the 5 root. There was a stenosis laterally here, we decompressed lateral recess, medial border of the pedicle, identified the disk space, it was not herniated posterolaterally and neural probe passed freely out the foramen of 4 bulging, but no herniated disk compressing the 4 root.  We felt therefore this did not require diskectomy, copiously irrigated that laminotomy defect.  No active bleeding or CSF leakage and we returned down the L5-S1 removing the Avitene.  There was some continued epidural bleeding.  We then mobilized from the opposite side of the operating room table.  Again, more bone wax placed on the cancellous surfaces identifying the epidural vein out and the foramen of S1 placing small pieces of Avitene there and waiting for 5 minutes for hemostasis.  We then achieved this.  The S1 nerve root was erythematous and edematous, but intact.  No evidence of CSF leakage or active bleeding, copious irrigation, removed the McCulloch retractor, closed the dorsolumbar fascia with 1 Vicryl, subcu with 2-0, and skin with subcuticular Prolene.  Sterile dressing applied, placed supine on the hospital bed, and extubated without difficulty, and transported to recovery room in satisfactory condition.  The patient tolerated the procedure well.  No complications.  Assistant, Lacie Draft, [place], was used throughout the case for patient transport, intermittent neural traction, suction and closure, 100 mL blood loss.     Susa Day, M.D.     [name]  D:  01/13/2016  T:  01/14/2016  Job:  [id number]

## 2016-01-14 NOTE — Progress Notes (Signed)
Occupational Therapy Evaluation Patient Details Name: [name] MRN: [id number] DOB: July 24, 1971 Today's Date: 01/14/2016    History of Present Illness s/p MICRO LUMBAR DECOMPRESSION L5-S1, L4-L5   Clinical Impression   All OT education completed and pt questions answered. No further OT needs at this time; will sign off.    Follow Up Recommendations  No OT follow up;Supervision/Assistance - 24 hour    Equipment Recommendations  None recommended by OT    Recommendations for Other Services       Precautions / Restrictions Precautions Precautions: Back;Fall Precaution Booklet Issued: Yes (comment) Precaution Comments: handout given and educated on back precautions Restrictions Weight Bearing Restrictions: No      Mobility Bed Mobility               General bed mobility comments: NT -- up in chair  Transfers Overall transfer level: Needs assistance Equipment used: Rolling walker (2 wheeled) Transfers: Sit to/from Stand Sit to Stand: Supervision              Balance                                            ADL Overall ADL's : Needs assistance/impaired Eating/Feeding: Independent;Sitting   Grooming: Supervision/safety;Standing   Upper Body Bathing: Set up;Sitting   Lower Body Bathing: Minimal assistance;Sit to/from stand   Upper Body Dressing : Set up;Sitting   Lower Body Dressing: Minimal assistance;Sit to/from stand   Toilet Transfer: Supervision/safety;Ambulation;Regular Toilet;RW   Toileting- Clothing Manipulation and Hygiene: Supervision/safety;Minimal assistance;Sit to/from stand   Tub/ Shower Transfer: Walk-in shower;Supervision/safety;Shower seat;Rolling walker   Functional mobility during ADLs: Supervision/safety;Rolling walker General ADL Comments: Patient educated on back precautions and ADL implications. She was able to ambulate to and from bathroom and practice bathroom transfers without difficulty. Educated  about AE available for LB self-care and toileting. Patient verbalized understanding.     Vision     Perception     Praxis      Pertinent Vitals/Pain Pain Assessment: 0-10 Pain Score: 5  Pain Location: back and R leg Pain Descriptors / Indicators: Tingling;Sore Pain Intervention(s): Limited activity within patient's tolerance;Monitored during session;Premedicated before session;Repositioned     Hand Dominance     Extremity/Trunk Assessment Upper Extremity Assessment Upper Extremity Assessment: Overall WFL for tasks assessed   Lower Extremity Assessment Lower Extremity Assessment: Defer to PT evaluation   Cervical / Trunk Assessment Cervical / Trunk Assessment: Normal   Communication Communication Communication: No difficulties   Cognition Arousal/Alertness: Awake/alert Behavior During Therapy: WFL for tasks assessed/performed Overall Cognitive Status: Within Functional Limits for tasks assessed                     General Comments       Exercises       Shoulder Instructions      Home Living Family/patient expects to be discharged to:: Private residence Living Arrangements: Spouse/significant other Available Help at Discharge: Family (spouse; mother to assist as well) Type of Home: House Home Access: Stairs to enter Technical brewer of Steps: 2 Entrance Stairs-Rails: None Home Layout: One level     Bathroom Shower/Tub: Occupational psychologist: Standard     Home Equipment: Shower seat - built in          Prior Functioning/Environment Level of Independence: Independent  OT Diagnosis: Acute pain   OT Problem List: Decreased knowledge of use of DME or AE;Pain   OT Treatment/Interventions:      OT Goals(Current goals can be found in the care plan section) Acute Rehab OT Goals Patient Stated Goal: home today OT Goal Formulation: All assessment and education complete, DC therapy  OT Frequency:     Barriers  to D/C:            Co-evaluation              End of Session Equipment Utilized During Treatment: Rolling walker Nurse Communication: Mobility status  Activity Tolerance: Patient tolerated treatment well Patient left: in chair;with call bell/phone within reach;with family/visitor present   Time: [id number] OT Time Calculation (min): 25 min Charges:  OT General Charges $OT Visit: 1 Procedure OT Evaluation $OT Eval Low Complexity: 1 Procedure G-Codes:    [name] Yates 23-Jan-2016, 9:14 AM

## 2016-01-15 NOTE — Anesthesia Postprocedure Evaluation (Signed)
Anesthesia Post Note  Patient: [name]  Procedure(s) Performed: Procedure(s) (LRB): MICRO LUMBAR DECOMPRESSION L5-S1, L4-L5 (N/A)  Patient location during evaluation: PACU Anesthesia Type: General Level of consciousness: awake and alert Pain management: pain level controlled Vital Signs Assessment: post-procedure vital signs reviewed and stable Respiratory status: spontaneous breathing, nonlabored ventilation, respiratory function stable and patient connected to nasal cannula oxygen Cardiovascular status: blood pressure returned to baseline and stable Postop Assessment: no signs of nausea or vomiting Anesthetic complications: no     Last Vitals:  Filed Vitals:   01/14/16 0451 01/14/16 1008  BP: 110/60 70/56  Pulse: 76 77  Temp: 36.6 C 37 C  Resp: 15 16    Last Pain:  Filed Vitals:   01/14/16 1242  PainSc: 5    Pain Goal: Patients Stated Pain Goal: 3 (01/14/16 1205)               Effie Berkshire

## 2016-01-16 ENCOUNTER — Encounter (HOSPITAL_COMMUNITY): Payer: Self-pay | Admitting: Specialist

## 2016-01-16 LAB — TYPE AND SCREEN
ABO/RH(D): O POS
Antibody Screen: NEGATIVE

## 2016-01-19 ENCOUNTER — Other Ambulatory Visit (HOSPITAL_COMMUNITY): Payer: 59

## 2016-02-03 ENCOUNTER — Telehealth: Payer: Self-pay

## 2016-02-03 NOTE — Telephone Encounter (Signed)
Paperwork signed, faxed, copy sent to scan. Original in cabinet for pt pick up, pt advised of same

## 2016-02-03 NOTE — Telephone Encounter (Signed)
STD received form [employer] regarding 01/05/2016 OV and subsequent surgery.  Pt advised that response is limited to OV only, additional information will have to be supplied by surgeon.  Pt understood and agreed, form place on MD's desk for signature

## 2016-02-27 ENCOUNTER — Ambulatory Visit (HOSPITAL_COMMUNITY)
Admission: RE | Admit: 2016-02-27 | Discharge: 2016-02-27 | Disposition: A | Discharge status: Home / Self Care | Payer: 59 | Source: Ambulatory Visit | Attending: Cardiology | Admitting: Cardiology

## 2016-02-27 ENCOUNTER — Other Ambulatory Visit (HOSPITAL_COMMUNITY): Payer: Self-pay | Admitting: Specialist

## 2016-02-27 DIAGNOSIS — M7989 Other specified soft tissue disorders: Secondary | ICD-10-CM | POA: Diagnosis not present

## 2016-02-27 DIAGNOSIS — F329 Major depressive disorder, single episode, unspecified: Secondary | ICD-10-CM | POA: Insufficient documentation

## 2016-02-27 DIAGNOSIS — M79604 Pain in right leg: Secondary | ICD-10-CM | POA: Insufficient documentation

## 2016-02-27 DIAGNOSIS — G4700 Insomnia, unspecified: Secondary | ICD-10-CM | POA: Diagnosis not present

## 2016-03-01 ENCOUNTER — Other Ambulatory Visit (INDEPENDENT_AMBULATORY_CARE_PROVIDER_SITE_OTHER): Payer: 59

## 2016-03-01 ENCOUNTER — Ambulatory Visit (INDEPENDENT_AMBULATORY_CARE_PROVIDER_SITE_OTHER): Payer: 59 | Admitting: Internal Medicine

## 2016-03-01 ENCOUNTER — Encounter: Payer: Self-pay | Admitting: Internal Medicine

## 2016-03-01 VITALS — BP 118/72 | HR 67 | Temp 98.2°F | Resp 20 | Wt 196.0 lb

## 2016-03-01 DIAGNOSIS — Z0001 Encounter for general adult medical examination with abnormal findings: Secondary | ICD-10-CM

## 2016-03-01 DIAGNOSIS — M21612 Bunion of left foot: Secondary | ICD-10-CM

## 2016-03-01 DIAGNOSIS — R21 Rash and other nonspecific skin eruption: Secondary | ICD-10-CM

## 2016-03-01 DIAGNOSIS — R6889 Other general symptoms and signs: Secondary | ICD-10-CM

## 2016-03-01 DIAGNOSIS — M5416 Radiculopathy, lumbar region: Secondary | ICD-10-CM

## 2016-03-01 DIAGNOSIS — M21611 Bunion of right foot: Secondary | ICD-10-CM

## 2016-03-01 DIAGNOSIS — I872 Venous insufficiency (chronic) (peripheral): Secondary | ICD-10-CM | POA: Diagnosis not present

## 2016-03-01 LAB — CBC WITH DIFFERENTIAL/PLATELET
BASOS ABS: 0 10*3/uL (ref 0.0–0.1)
Basophils Relative: 0.6 % (ref 0.0–3.0)
Eosinophils Absolute: 0.2 10*3/uL (ref 0.0–0.7)
Eosinophils Relative: 2.7 % (ref 0.0–5.0)
HEMATOCRIT: 40.1 % (ref 36.0–46.0)
HEMOGLOBIN: 13.3 g/dL (ref 12.0–15.0)
LYMPHS PCT: 40.1 % (ref 12.0–46.0)
Lymphs Abs: 2.3 10*3/uL (ref 0.7–4.0)
MCHC: 33.1 g/dL (ref 30.0–36.0)
MCV: 94.9 fl (ref 78.0–100.0)
MONOS PCT: 7.7 % (ref 3.0–12.0)
Monocytes Absolute: 0.4 10*3/uL (ref 0.1–1.0)
NEUTROS PCT: 48.9 % (ref 43.0–77.0)
Neutro Abs: 2.8 10*3/uL (ref 1.4–7.7)
Platelets: 253 10*3/uL (ref 150.0–400.0)
RBC: 4.23 Mil/uL (ref 3.87–5.11)
RDW: 14.5 % (ref 11.5–15.5)
WBC: 5.7 10*3/uL (ref 4.0–10.5)

## 2016-03-01 LAB — URINALYSIS, ROUTINE W REFLEX MICROSCOPIC
Bilirubin Urine: NEGATIVE
HGB URINE DIPSTICK: NEGATIVE
Ketones, ur: NEGATIVE
LEUKOCYTES UA: NEGATIVE
NITRITE: NEGATIVE
Specific Gravity, Urine: 1.025 (ref 1.000–1.030)
TOTAL PROTEIN, URINE-UPE24: NEGATIVE
URINE GLUCOSE: NEGATIVE
UROBILINOGEN UA: 0.2 (ref 0.0–1.0)
pH: 6 (ref 5.0–8.0)

## 2016-03-01 LAB — HEPATIC FUNCTION PANEL
ALBUMIN: 4.2 g/dL (ref 3.5–5.2)
ALT: 50 U/L — ABNORMAL HIGH (ref 0–35)
AST: 24 U/L (ref 0–37)
Alkaline Phosphatase: 76 U/L (ref 39–117)
Bilirubin, Direct: 0.1 mg/dL (ref 0.0–0.3)
TOTAL PROTEIN: 7.8 g/dL (ref 6.0–8.3)
Total Bilirubin: 0.4 mg/dL (ref 0.2–1.2)

## 2016-03-01 LAB — BASIC METABOLIC PANEL
BUN: 18 mg/dL (ref 6–23)
CO2: 24 meq/L (ref 19–32)
CREATININE: 0.81 mg/dL (ref 0.40–1.20)
Calcium: 9.5 mg/dL (ref 8.4–10.5)
Chloride: 105 mEq/L (ref 96–112)
GFR: 81.13 mL/min (ref >60.00)
GLUCOSE: 98 mg/dL (ref 70–99)
Potassium: 4.3 mEq/L (ref 3.5–5.1)
Sodium: 137 mEq/L (ref 135–145)

## 2016-03-01 LAB — LIPID PANEL
CHOLESTEROL: 185 mg/dL (ref 0–200)
HDL: 61.2 mg/dL (ref >39.00)
LDL CALC: 110 mg/dL — AB (ref 0–99)
NONHDL: 123.6
Total CHOL/HDL Ratio: 3
Triglycerides: 66 mg/dL (ref 0.0–149.0)
VLDL: 13.2 mg/dL (ref 0.0–40.0)

## 2016-03-01 LAB — TSH: TSH: 3.01 u[IU]/mL (ref 0.35–4.50)

## 2016-03-01 MED ORDER — LEVOTHYROXINE SODIUM 100 MCG PO TABS: ORAL_TABLET | ORAL | Status: DC

## 2016-03-01 MED ORDER — DICLOFENAC SODIUM 1 % TD GEL: 4.0000 g | Freq: Four times a day (QID) | TRANSDERMAL | Status: DC | PRN

## 2016-03-01 MED ORDER — HYDROCHLOROTHIAZIDE 25 MG PO TABS: ORAL_TABLET | ORAL | Status: DC

## 2016-03-01 NOTE — Patient Instructions (Signed)
Please take all new medication as prescribed - the mild fluid pill, and the topical anti-inflammatory medicaiton  You can also try OTC antifungal cream for the spots about the neck  Please continue all other medications as before, and refills have been done if requested.  Please have the pharmacy call with any other refills you may need.  Please continue your efforts at being more active, low cholesterol diet, and weight control.  You are otherwise up to date with prevention measures today.  Please keep your appointments with your specialists as you may have planned  Please go to the LAB in the Basement (turn left off the elevator) for the tests to be done today  You will be contacted by phone if any changes need to be made immediately.  Otherwise, you will receive a letter about your results with an explanation, but please check with MyChart first.  Please remember to sign up for MyChart if you have not done so, as this will be important to you in the future with finding out test results, communicating by private email, and scheduling acute appointments online when needed.  Please return in 1 year for your yearly visit, or sooner if needed, with Lab testing done 3-5 days before

## 2016-03-01 NOTE — Progress Notes (Signed)
Pre visit review using our clinic review tool, if applicable. No additional management support is needed unless otherwise documented below in the visit note. 

## 2016-03-01 NOTE — Progress Notes (Signed)
Subjective:    Patient ID: [name], female    DOB: 04/01/1971, 45 y.o.   MRN: [id number]  HPI  Here for wellness and f/u;  Overall doing ok;  Pt denies Chest pain, worsening SOB, DOE, wheezing, orthopnea, PND, worsening LE edema, palpitations, dizziness or syncope.  Pt denies neurological change such as new headache, facial or extremity weakness.  Pt denies polydipsia, polyuria, or low sugar symptoms. Pt states overall good compliance with treatment and medications, good tolerability, and has been trying to follow appropriate diet.  Pt denies worsening depressive symptoms, suicidal ideation or panic. No fever, night sweats, wt loss, loss of appetite, or other constitutional symptoms.  Pt states good ability with ADL's, has low fall risk, home safety reviewed and adequate, no other significant changes in hearing or vision, and only occasionally active with exercise.   Pt denies Chest pain, worsening SOB, DOE, wheezing, orthopnea, PND, palpitations, dizziness or syncope, but has had worsening swelling to both LE's over the past 3-6 months, worse in the evening, better in the AM.    Denies hyper or hypo thyroid symptoms such as voice, skin or hair change.  Also with several lightened skin areas right lateral base of neck for 2 mo, tends to sweat sometimes  Pt continues to have recurring LBP s/p surgury x 2 mo without change in severity, no bowel or bladder change, fever, wt loss,  worsening LE pain/numbness/weakness, gait change or falls.  Has also painful bilateral bunions with intermittent swelling depending on how much she stands, ongoing for years.   Past Medical History:  Diagnosis Date  . Abdominal pain, epigastric 06/19/2010  . Allergy   . Cancer [facility])    thyroid cancer   . DEPRESSION 05/02/2010  . HYPERTHYROIDISM 05/02/2010  . HYPOTHYROIDISM 05/02/2010  . INSOMNIA 05/02/2010  . NAUSEA 06/19/2010  . TRANSAMINASES, SERUM, ELEVATED 05/02/2010   Past Surgical History:  Procedure  Laterality Date  . APPENDECTOMY    . CESAREAN SECTION     x 2  . CHOLECYSTECTOMY  2011  . LUMBAR LAMINECTOMY/DECOMPRESSION MICRODISCECTOMY N/A 01/13/2016   Procedure: MICRO LUMBAR DECOMPRESSION L5-S1, L4-L5;  Surgeon: Susa Day, MD;  Location: [facility];  Service: Orthopedics;  Laterality: N/A;  . s/p compound fx left femur s/p rod, now removed    . TUBAL LIGATION      reports that she has never smoked. She has never used smokeless tobacco. She reports that she drinks alcohol. She reports that she does not use drugs. family history includes Alcohol abuse in her father and mother; COPD in her other and other; Cancer in her paternal uncle; Depression in her mother; Diabetes in her father and other; Heart disease in her father and other; Hyperlipidemia in her father and other; Hypertension in her father and other; Stroke in her mother and other. Allergies  Allergen Reactions  . Sulfa Antibiotics Hives   No current outpatient prescriptions on file prior to visit.   No current facility-administered medications on file prior to visit.    Review of Systems Constitutional: Negative for increased diaphoresis, or other activity, appetite or siginficant weight change other than noted HENT: Negative for worsening hearing loss, ear pain, facial swelling, mouth sores and neck stiffness.   Eyes: Negative for other worsening pain, redness or visual disturbance.  Respiratory: Negative for choking or stridor Cardiovascular: Negative for other chest pain and palpitations.  Gastrointestinal: Negative for worsening diarrhea, blood in stool, or abdominal distention Genitourinary: Negative for hematuria, flank pain or  change in urine volume.  Musculoskeletal: Negative for myalgias or other joint complaints.  Skin: Negative for other color change and wound or drainage.  Neurological: Negative for syncope and numbness. other than noted Hematological: Negative for adenopathy. or other  swelling Psychiatric/Behavioral: Negative for hallucinations, SI, self-injury, decreased concentration or other worsening agitation.      Objective:   Physical Exam BP 118/72 mmHg  Pulse 67  Temp(Src) 98.2 F (36.8 C) (Oral)  Resp 20  Wt 196 lb (88.905 kg)  SpO2 97% VS noted,  Constitutional: Pt is oriented to person, place, and time. Appears well-developed and well-nourished, in no significant distress Head: Normocephalic and atraumatic  Eyes: Conjunctivae and EOM are normal. Pupils are equal, round, and reactive to light Right Ear: External ear normal.  Left Ear: External ear normal Nose: Nose normal.  Mouth/Throat: Oropharynx is clear and moist  Neck: Normal range of motion. Neck supple. No JVD present. No tracheal deviation present or significant neck LA or mass Cardiovascular: Normal rate, regular rhythm, normal heart sounds and intact distal pulses.   Pulmonary/Chest: Effort normal and breath sounds without rales or wheezing  Abdominal: Soft. Bowel sounds are normal. NT. No HSM  Musculoskeletal: Normal range of motion. Exhibits no edema Lymphadenopathy: Has no cervical adenopathy.  Neurological: Pt is alert and oriented to person, place, and time. Pt has normal reflexes. No cranial nerve deficit. Motor grossly intact Skin: Skin is warm and dry. + tinea type rash noted or new ulcers Psychiatric:  Has normal mood and affect. Behavior is normal.  Spine diffuse tender lumbar midline; bilat bunions 2-3+ with tender soft tissue swelling    Assessment & Plan:

## 2016-03-02 ENCOUNTER — Encounter: Payer: Self-pay | Admitting: Internal Medicine

## 2016-03-04 NOTE — Assessment & Plan Note (Signed)
Tinea type, for monistat otc prn

## 2016-03-04 NOTE — Assessment & Plan Note (Signed)
C/w pt, for leg elevation, wt loss, low salt, hct 12.5 qd prn

## 2016-03-04 NOTE — Assessment & Plan Note (Signed)
Improved, cont f/u surgury

## 2016-03-04 NOTE — Assessment & Plan Note (Signed)

## 2016-03-04 NOTE — Assessment & Plan Note (Addendum)
D/w pt, declines podiatry referral, for volt gel prn,  to f/u any worsening symptoms or concerns  In addition to the time spent performing CPE, I spent an additional 25 minutes face to face,in which greater than 50% of this time was spent in counseling and coordination of care for patient's acute illness as documented.

## 2016-06-18 ENCOUNTER — Ambulatory Visit: Payer: 59 | Admitting: Internal Medicine

## 2016-06-29 ENCOUNTER — Ambulatory Visit (INDEPENDENT_AMBULATORY_CARE_PROVIDER_SITE_OTHER): Payer: 59 | Admitting: Internal Medicine

## 2016-06-29 ENCOUNTER — Encounter: Payer: Self-pay | Admitting: Emergency Medicine

## 2016-06-29 VITALS — BP 104/66 | HR 87 | Temp 98.8°F | Resp 16 | Wt 196.0 lb

## 2016-06-29 DIAGNOSIS — R062 Wheezing: Secondary | ICD-10-CM | POA: Insufficient documentation

## 2016-06-29 DIAGNOSIS — J019 Acute sinusitis, unspecified: Secondary | ICD-10-CM

## 2016-06-29 DIAGNOSIS — R059 Cough, unspecified: Secondary | ICD-10-CM

## 2016-06-29 DIAGNOSIS — R05 Cough: Secondary | ICD-10-CM | POA: Diagnosis not present

## 2016-06-29 MED ORDER — HYDROCODONE-HOMATROPINE 5-1.5 MG/5ML PO SYRP: 5.0000 mL | ORAL_SOLUTION | Freq: Four times a day (QID) | ORAL | 0 refills | Status: AC | PRN

## 2016-06-29 MED ORDER — LEVOFLOXACIN 500 MG PO TABS: 500.0000 mg | ORAL_TABLET | Freq: Every day | ORAL | 0 refills | Status: AC

## 2016-06-29 MED ORDER — ALBUTEROL SULFATE HFA 108 (90 BASE) MCG/ACT IN AERS: 2.0000 | INHALATION_SPRAY | Freq: Four times a day (QID) | RESPIRATORY_TRACT | 2 refills | Status: DC | PRN

## 2016-06-29 MED ORDER — METHYLPREDNISOLONE ACETATE 80 MG/ML IJ SUSP
80.0000 mg | Freq: Once | INTRAMUSCULAR | Status: AC
  Administered 2016-06-29: 80 mg via INTRAMUSCULAR

## 2016-06-29 MED ORDER — PREDNISONE 10 MG PO TABS: ORAL_TABLET | ORAL | 0 refills | Status: DC

## 2016-06-29 NOTE — Progress Notes (Signed)
Pre visit review using our clinic review tool, if applicable. No additional management support is needed unless otherwise documented below in the visit note. 

## 2016-06-29 NOTE — Assessment & Plan Note (Addendum)
Near debilitating, for cough med prn, work note given, also for cxr r/o pna

## 2016-06-29 NOTE — Assessment & Plan Note (Signed)
Mild to mod, c/w bronchospasm type, for depomedrol IM 80, predpac asd,  to f/u any worsening symptoms or concerns

## 2016-06-29 NOTE — Progress Notes (Signed)
Subjective:    Patient ID: [name], female    DOB: Mar 27, 1971, 45 y.o.   MRN: [id number]  HPI    Here with 2 days sudden acute onset fever,severe left > right facial pain, pressure, headache, general weakness and malaise, and greenish d/c, with mod ST and very freq scant prod cough, couldn't sleep due to cough, had to sit home from work today; also assoc with mild wheeze and sob today,  but pt denies chest pain, orthopnea, PND, increased LE swelling, palpitations, dizziness or syncope. Pt denies new neurological symptoms such as new headache, or facial or extremity weakness or numbness  Pt denies polydipsia, polyuria Past Medical History:  Diagnosis Date  . Abdominal pain, epigastric 06/19/2010  . Allergy   . Cancer [facility])    thyroid cancer   . DEPRESSION 05/02/2010  . HYPERTHYROIDISM 05/02/2010  . HYPOTHYROIDISM 05/02/2010  . INSOMNIA 05/02/2010  . NAUSEA 06/19/2010  . TRANSAMINASES, SERUM, ELEVATED 05/02/2010   Past Surgical History:  Procedure Laterality Date  . APPENDECTOMY    . CESAREAN SECTION     x 2  . CHOLECYSTECTOMY  2011  . LUMBAR LAMINECTOMY/DECOMPRESSION MICRODISCECTOMY N/A 01/13/2016   Procedure: MICRO LUMBAR DECOMPRESSION L5-S1, L4-L5;  Surgeon: Susa Day, MD;  Location: [facility];  Service: Orthopedics;  Laterality: N/A;  . s/p compound fx left femur s/p rod, now removed    . TUBAL LIGATION      reports that she has never smoked. She has never used smokeless tobacco. She reports that she drinks alcohol. She reports that she does not use drugs. family history includes Alcohol abuse in her father and mother; COPD in her other and other; Cancer in her paternal uncle; Depression in her mother; Diabetes in her father and other; Heart disease in her father and other; Hyperlipidemia in her father and other; Hypertension in her father and other; Stroke in her mother and other. Allergies  Allergen Reactions  . Sulfa Antibiotics Hives   Current Outpatient Prescriptions on  File Prior to Visit  Medication Sig Dispense Refill  . levothyroxine (SYNTHROID, LEVOTHROID) 100 MCG tablet TAKE ONE TABLET BY MOUTH ONCE DAILY BEFORE  BREAKFAST 90 tablet 3   No current facility-administered medications on file prior to visit.    Review of Systems  Constitutional: Negative for unusual diaphoresis or night sweats HENT: Negative for ear swelling or discharge Eyes: Negative for worsening visual haziness  Respiratory: Negative for choking and stridor.   Gastrointestinal: Negative for distension or worsening eructation Genitourinary: Negative for retention or change in urine volume.  Musculoskeletal: Negative for other MSK pain or swelling Skin: Negative for color change and worsening wound Neurological: Negative for tremors and numbness other than noted  Psychiatric/Behavioral: Negative for decreased concentration or agitation other than above   All other system neg per pt    Objective:   Physical Exam BP 104/66   Pulse 87   Temp 98.8 F (37.1 C) (Oral)   Resp 16   Wt 196 lb (88.9 kg)   SpO2 98%   BMI 29.80 kg/m  .VS noted, mild ill appearing Constitutional: Pt appears in no apparent distress HENT: Head: NCAT.  Right Ear: External ear normal.  Left Ear: External ear normal.  Eyes: . Pupils are equal, round, and reactive to light. Conjunctivae and EOM are normal Bilat tm's with mild erythema.  Max sinus areas mod to severe tender left > right,.  Pharynx with severe erythema, no exudate but with diffuse pharyngeal swelling Neck: Normal  range of motion. Neck supple.  Cardiovascular: Normal rate and regular rhythm.   Pulmonary/Chest: Effort normal and breath sounds decreased without rales but with bilat few wheezing.  Neurological: Pt is alert. Not confused , motor grossly intact Skin: Skin is warm. No rash, no LE edema Psychiatric: Pt behavior is normal. No agitation.  No other new exam findings    Assessment & Plan:

## 2016-06-29 NOTE — Patient Instructions (Signed)
You had the steroid shot today  Please take all new medication as prescribed  - the antibiotic, cough medicine if needed, prednisone, and the inhaler if needed  Please continue all other medications as before, and refills have been done if requested.  Please have the pharmacy call with any other refills you may need.  Please keep your appointments with your specialists as you may have planned  Please go to the XRAY Department in the Basement (go straight as you get off the elevator) for the x-ray testing  You will be contacted by phone if any changes need to be made immediately.  Otherwise, you will receive a letter about your results with an explanation, but please check with MyChart first.  Please remember to sign up for MyChart if you have not done so, as this will be important to you in the future with finding out test results, communicating by private email, and scheduling acute appointments online when needed.'

## 2016-06-29 NOTE — Assessment & Plan Note (Signed)
Mild to mod, for antibx course,  to f/u any worsening symptoms or concerns 

## 2016-09-10 NOTE — Progress Notes (Deleted)
[facility] [address] [place], [place] [address] Phone: [phone] Subjective:    I'm seeing this patient by the request  of:  Cathlean Cower, MD   CC: Left knee pain  [id number]  [name] is a 46 y.o. female coming in with complaint of left knee pain. Patient was seen greater than 2 years ago and was diagnosed with a meniscal tear. Patient also has had significant amount of pain previously and an MRI was ordered and showed the patient did have chondromalacia. Patient states   since we seen patient patient has also undergone a microlumbar discectomy June 2017.  Past Medical History:  Diagnosis Date  . Abdominal pain, epigastric 06/19/2010  . Allergy   . Cancer [facility])    thyroid cancer   . DEPRESSION 05/02/2010  . HYPERTHYROIDISM 05/02/2010  . HYPOTHYROIDISM 05/02/2010  . INSOMNIA 05/02/2010  . NAUSEA 06/19/2010  . TRANSAMINASES, SERUM, ELEVATED 05/02/2010   Past Surgical History:  Procedure Laterality Date  . APPENDECTOMY    . CESAREAN SECTION     x 2  . CHOLECYSTECTOMY  2011  . LUMBAR LAMINECTOMY/DECOMPRESSION MICRODISCECTOMY N/A 01/13/2016   Procedure: MICRO LUMBAR DECOMPRESSION L5-S1, L4-L5;  Surgeon: Susa Day, MD;  Location: [facility];  Service: Orthopedics;  Laterality: N/A;  . s/p compound fx left femur s/p rod, now removed    . TUBAL LIGATION     Social History   Social History  . Marital status: Married    Spouse name: N/A  . Number of children: 3  . Years of education: N/A   Occupational History  . [employer] Conservation officer, nature since [place] History Main Topics  . Smoking status: Never Smoker  . Smokeless tobacco: Never Used  . Alcohol use 0.0 oz/week     Comment: Occ- wine   . Drug use: No  . Sexual activity: Not on file   Other Topics Concern  . Not on file   Social History Narrative   3 biological children, 1 adopted   Allergies  Allergen Reactions  . Sulfa Antibiotics Hives   Family  History  Problem Relation Age of Onset  . Stroke Mother   . Depression Mother   . Alcohol abuse Mother   . Alcohol abuse Father   . Heart disease Father   . Hyperlipidemia Father   . Hypertension Father   . Diabetes Father   . Heart disease Other   . Hypertension Other   . Hyperlipidemia Other   . Stroke Other   . Diabetes Other   . COPD Other   . COPD Other   . Cancer Paternal Uncle     mets all over   . Breast cancer      M. Great grandmother    Past medical history, social, surgical and family history all reviewed in electronic medical record.  No pertanent information unless stated regarding to the chief complaint.   Review of Systems:Review of systems updated and as accurate as of 09/10/16  No headache, visual changes, nausea, vomiting, diarrhea, constipation, dizziness, abdominal pain, skin rash, fevers, chills, night sweats, weight loss, swollen lymph nodes, body aches, joint swelling, muscle aches, chest pain, shortness of breath, mood changes.   Objective  There were no vitals taken for this visit. Systems examined below as of 09/10/16   General: No apparent distress alert and oriented x3 mood and affect normal, dressed appropriately.  HEENT: Pupils equal, extraocular movements intact  Respiratory:  Patient's speak in full sentences and does not appear short of breath  Cardiovascular: No lower extremity edema, non tender, no erythema  Skin: Warm dry intact with no signs of infection or rash on extremities or on axial skeleton.  Abdomen: Soft nontender  Neuro: Cranial nerves II through XII are intact, neurovascularly intact in all extremities with 2+ DTRs and 2+ pulses.  Lymph: No lymphadenopathy of posterior or anterior cervical chain or axillae bilaterally.  Gait normal with good balance and coordination.  MSK:  Non tender with full range of motion and good stability and symmetric strength and tone of shoulders, elbows, wrist, hip, knee and ankles bilaterally.       Impression and Recommendations:     This case required medical decision making of moderate complexity.      Note: This dictation was prepared with Dragon dictation along with smaller phrase technology. Any transcriptional errors that result from this process are unintentional.

## 2016-09-11 ENCOUNTER — Ambulatory Visit: Payer: 59 | Admitting: Family Medicine

## 2016-09-14 ENCOUNTER — Ambulatory Visit: Payer: 59 | Admitting: Sports Medicine

## 2016-09-17 ENCOUNTER — Ambulatory Visit: Payer: 59 | Admitting: Sports Medicine

## 2016-10-18 ENCOUNTER — Ambulatory Visit (INDEPENDENT_AMBULATORY_CARE_PROVIDER_SITE_OTHER): Payer: 59 | Admitting: Internal Medicine

## 2016-10-18 ENCOUNTER — Encounter: Payer: Self-pay | Admitting: Internal Medicine

## 2016-10-18 VITALS — BP 120/84 | HR 72 | Temp 97.6°F | Ht 68.0 in | Wt 197.0 lb

## 2016-10-18 DIAGNOSIS — R21 Rash and other nonspecific skin eruption: Secondary | ICD-10-CM

## 2016-10-18 MED ORDER — TERBINAFINE HCL 1 % EX CREA: 1.0000 "application " | TOPICAL_CREAM | Freq: Two times a day (BID) | CUTANEOUS | 1 refills | Status: DC

## 2016-10-18 MED ORDER — FLUCONAZOLE 150 MG PO TABS: ORAL_TABLET | ORAL | 0 refills | Status: DC

## 2016-10-18 NOTE — Patient Instructions (Signed)
You appear to have a "Tinea" type of rash  Please take all new medication as prescribed  - the pills and cream (ok to use both at same time)  Please continue all other medications as before, and refills have been done if requested.  Please have the pharmacy call with any other refills you may need.  Please keep your appointments with your specialists as you may have planned

## 2016-10-18 NOTE — Assessment & Plan Note (Signed)
C/w likely tinea versicolor - Mild to mod, for antibx course,  to f/u any worsening symptoms or concerns

## 2016-10-20 NOTE — Progress Notes (Signed)
   Subjective:    Patient ID: [name], female    DOB: [birth year], 46 y.o.   MRN: [id number]  HPI  Here with 1 wk onset itchy spreading rash to neck now spreading to the mid and right upper back, without fever, pain, drainage or hx of same.  Nothing makes better or worse  Pt denies chest pain, increased sob or doe, wheezing, orthopnea, PND, increased LE swelling, palpitations, dizziness or syncope.   Past Medical History:  Diagnosis Date  . Abdominal pain, epigastric 06/19/2010  . Allergy   . Cancer [facility])    thyroid cancer   . DEPRESSION 05/02/2010  . HYPERTHYROIDISM 05/02/2010  . HYPOTHYROIDISM 05/02/2010  . INSOMNIA 05/02/2010  . NAUSEA 06/19/2010  . TRANSAMINASES, SERUM, ELEVATED 05/02/2010   Past Surgical History:  Procedure Laterality Date  . APPENDECTOMY    . CESAREAN SECTION     x 2  . CHOLECYSTECTOMY  2011  . LUMBAR LAMINECTOMY/DECOMPRESSION MICRODISCECTOMY N/A 01/13/2016   Procedure: MICRO LUMBAR DECOMPRESSION L5-S1, L4-L5;  Surgeon: Susa Day, MD;  Location: [facility];  Service: Orthopedics;  Laterality: N/A;  . s/p compound fx left femur s/p rod, now removed    . TUBAL LIGATION      reports that she has never smoked. She has never used smokeless tobacco. She reports that she drinks alcohol. She reports that she does not use drugs. family history includes Alcohol abuse in her father and mother; COPD in her other and other; Cancer in her paternal uncle; Depression in her mother; Diabetes in her father and other; Heart disease in her father and other; Hyperlipidemia in her father and other; Hypertension in her father and other; Stroke in her mother and other. Allergies  Allergen Reactions  . Sulfa Antibiotics Hives   Current Outpatient Prescriptions on File Prior to Visit  Medication Sig Dispense Refill  . levothyroxine (SYNTHROID, LEVOTHROID) 100 MCG tablet TAKE ONE TABLET BY MOUTH ONCE DAILY BEFORE  BREAKFAST 90 tablet 3   No current facility-administered medications on  file prior to visit.     Review of Systems All otherwise neg per pt     Objective:   Physical Exam BP 120/84   Pulse 72   Temp 97.6 F (36.4 C)   Ht 5\' 8"  (1.727 m)   Wt 197 lb (89.4 kg)   SpO2 98%   BMI 29.95 kg/m  VS noted,  Constitutional: Pt appears in no apparent distress HENT: Head: NCAT.  Right Ear: External ear normal.  Left Ear: External ear normal.  Eyes: . Pupils are equal, round, and reactive to light. Conjunctivae and EOM are normal Neck: Normal range of motion. Neck supple.  Cardiovascular: Normal rate and regular rhythm.   Pulmonary/Chest: Effort normal and breath sounds without rales or wheezing.  Neurological: Pt is alert. Not confused , motor grossly intact Skin: Skin is warm. no LE edema, has large area post neck and right upper back approx 6 x 8 cm area erythematous tinea type rash Psychiatric: Pt behavior is normal. No agitation.  No other exam findings    Assessment & Plan:

## 2016-12-19 ENCOUNTER — Other Ambulatory Visit: Payer: Self-pay | Admitting: Vascular Surgery

## 2016-12-19 DIAGNOSIS — M7989 Other specified soft tissue disorders: Secondary | ICD-10-CM

## 2017-01-16 ENCOUNTER — Encounter: Payer: Self-pay | Admitting: Vascular Surgery

## 2017-01-22 ENCOUNTER — Ambulatory Visit (INDEPENDENT_AMBULATORY_CARE_PROVIDER_SITE_OTHER): Payer: 59 | Admitting: Vascular Surgery

## 2017-01-22 ENCOUNTER — Encounter: Payer: Self-pay | Admitting: Vascular Surgery

## 2017-01-22 ENCOUNTER — Ambulatory Visit (HOSPITAL_COMMUNITY)
Admission: RE | Admit: 2017-01-22 | Discharge: 2017-01-22 | Disposition: A | Discharge status: Home / Self Care | Payer: 59 | Source: Ambulatory Visit | Attending: Vascular Surgery | Admitting: Vascular Surgery

## 2017-01-22 VITALS — BP 119/78 | HR 68 | Temp 97.4°F | Resp 16 | Ht 68.0 in | Wt 199.0 lb

## 2017-01-22 DIAGNOSIS — M7989 Other specified soft tissue disorders: Secondary | ICD-10-CM | POA: Diagnosis present

## 2017-01-22 NOTE — Progress Notes (Signed)
[facility]  Patient name: [name] MRN: [id number] DOB: Mar 17, 1971 Sex: female  REASON FOR CONSULT: Evaluation right leg swelling  HPI: [name] is a 46 y.o. female, who is here today for evaluation. He is very pleasant female who underwent urgent spine surgery approximate 1 year ago for right leg pain and sciatic compression. She had the resolution of the pain still has some mild numbness in her right foot. She has had progressive swelling in her right leg. She did have the duplex showed showing no DVT is here today for continued evaluation. She has minimal swelling currently today. She reports that this can be more progressive the time. She reports that she can occasionally awaken with swollen leg and occasionally it is more progressive throughout the day if she is wanting a great deal. She does not have any history of DVT. Did have sclerotherapy for telangiectasia for cosmetic issues years ago.  Past Medical History:  Diagnosis Date  . Abdominal pain, epigastric 06/19/2010  . Allergy   . Cancer [facility])    thyroid cancer   . DEPRESSION 05/02/2010  . HYPERTHYROIDISM 05/02/2010  . HYPOTHYROIDISM 05/02/2010  . INSOMNIA 05/02/2010  . NAUSEA 06/19/2010  . TRANSAMINASES, SERUM, ELEVATED 05/02/2010    Family History  Problem Relation Age of Onset  . Stroke Mother   . Depression Mother   . Alcohol abuse Mother   . Alcohol abuse Father   . Heart disease Father   . Hyperlipidemia Father   . Hypertension Father   . Diabetes Father   . Heart disease Other   . Hypertension Other   . Hyperlipidemia Other   . Stroke Other   . Diabetes Other   . COPD Other   . COPD Other   . Cancer Paternal Uncle        mets all over   . Breast cancer Unknown        M. Great grandmother    SOCIAL HISTORY: Social History   Social History  . Marital status: Married    Spouse name: N/A  . Number of children: 3  . Years of  education: N/A   Occupational History  . [employer] Conservation officer, nature since [place] History Main Topics  . Smoking status: Never Smoker  . Smokeless tobacco: Never Used  . Alcohol use 0.0 oz/week     Comment: Occ- wine   . Drug use: No  . Sexual activity: Not on file   Other Topics Concern  . Not on file   Social History Narrative   3 biological children, 1 adopted    Allergies  Allergen Reactions  . Sulfa Antibiotics Hives    Current Outpatient Prescriptions  Medication Sig Dispense Refill  . levothyroxine (SYNTHROID, LEVOTHROID) 100 MCG tablet TAKE ONE TABLET BY MOUTH ONCE DAILY BEFORE  BREAKFAST 90 tablet 3  . fluconazole (DIFLUCAN) 150 MG tablet 2 tabs by mouth once weekly for 2 weeks (Patient not taking: Reported on 01/22/2017) 4 tablet 0  . terbinafine (LAMISIL) 1 % cream Apply 1 application topically 2 (two) times daily. (Patient not taking: Reported on 01/22/2017) 30 g 1   No current facility-administered medications for this visit.     REVIEW OF SYSTEMS:  [X]  denotes positive finding, [ ]  denotes negative finding Cardiac  Comments:  Chest pain or chest pressure:    Shortness of breath upon exertion:    Short of breath when lying flat:  Irregular heart rhythm:        Vascular    Pain in calf, thigh, or hip brought on by ambulation:    Pain in feet at night that wakes you up from your sleep:     Blood clot in your veins:    Leg swelling:  x       Pulmonary    Oxygen at home:    Productive cough:     Wheezing:         Neurologic    Sudden weakness in arms or legs:  x   Sudden numbness in arms or legs:  x   Sudden onset of difficulty speaking or slurred speech:    Temporary loss of vision in one eye:     Problems with dizziness:         Gastrointestinal    Blood in stool:     Vomited blood:         Genitourinary    Burning when urinating:     Blood in urine:        Psychiatric    Major depression:         Hematologic      Bleeding problems:    Problems with blood clotting too easily:        Skin    Rashes or ulcers:        Constitutional    Fever or chills:      PHYSICAL EXAM: Vitals:   01/22/17 1347  BP: 119/78  Pulse: 68  Resp: 16  Temp: 97.4 F (36.3 C)  SpO2: 98%  Weight: 199 lb (90.3 kg)  Height: 5\' 8"  (1.727 m)    GENERAL: The patient is a well-nourished female, in no acute distress. The vital signs are documented above. CARDIOVASCULAR: 2+ radial and 2+ dorsalis pedis pulses bilaterally. No significant right or leg swelling today. No changes of chronic venous hypertension, specifically no hemosiderin deposits or varicosities PULMONARY: There is good air exchange  ABDOMEN: Soft and non-tender  MUSCULOSKELETAL: There are no major deformities or cyanosis. NEUROLOGIC: No focal weakness or paresthesias are detected. SKIN: There are no ulcers or rashes noted. PSYCHIATRIC: The patient has a normal affect.  DATA:  Duplex today reveals no evidence of DVT or other obstruction. Does have incompetence in her common femoral vein but has competence in all surface veins in her femoral and popliteal vein  MEDICAL ISSUES: Reassured the patient is a do not see any evidence of any serious pathology associated with this. Suspect that her swelling is related to deep venous incompetence. Explained that this may be progressive over time. Explain the treatment options are elevation and compression. If she has progression would recommend knee-high graduated compression garments. She was reassured this discussion will see [place] again on an as-needed basis   Rosetta Posner, MD [facility] Tel [phone] Pager [phone]

## 2017-04-13 ENCOUNTER — Other Ambulatory Visit: Payer: Self-pay | Admitting: Internal Medicine

## 2017-04-17 ENCOUNTER — Other Ambulatory Visit: Payer: Self-pay | Admitting: Internal Medicine

## 2017-04-24 ENCOUNTER — Ambulatory Visit: Payer: 59 | Admitting: Internal Medicine

## 2017-05-16 ENCOUNTER — Other Ambulatory Visit: Payer: Self-pay | Admitting: Internal Medicine

## 2017-05-16 NOTE — Telephone Encounter (Signed)
Pt called requesting this refill. She would like a 90 day supply.

## 2017-05-30 ENCOUNTER — Other Ambulatory Visit: Payer: 59

## 2017-05-31 ENCOUNTER — Ambulatory Visit (INDEPENDENT_AMBULATORY_CARE_PROVIDER_SITE_OTHER): Payer: 59 | Admitting: Internal Medicine

## 2017-05-31 ENCOUNTER — Encounter: Payer: Self-pay | Admitting: Internal Medicine

## 2017-05-31 ENCOUNTER — Other Ambulatory Visit (INDEPENDENT_AMBULATORY_CARE_PROVIDER_SITE_OTHER): Payer: 59

## 2017-05-31 VITALS — BP 128/84 | HR 70 | Temp 98.1°F | Ht 68.0 in | Wt 199.0 lb

## 2017-05-31 DIAGNOSIS — Z0000 Encounter for general adult medical examination without abnormal findings: Secondary | ICD-10-CM

## 2017-05-31 DIAGNOSIS — R1030 Lower abdominal pain, unspecified: Secondary | ICD-10-CM | POA: Insufficient documentation

## 2017-05-31 DIAGNOSIS — R400 Somnolence: Secondary | ICD-10-CM | POA: Diagnosis not present

## 2017-05-31 DIAGNOSIS — E559 Vitamin D deficiency, unspecified: Secondary | ICD-10-CM

## 2017-05-31 DIAGNOSIS — Z23 Encounter for immunization: Secondary | ICD-10-CM

## 2017-05-31 DIAGNOSIS — E039 Hypothyroidism, unspecified: Secondary | ICD-10-CM

## 2017-05-31 DIAGNOSIS — E538 Deficiency of other specified B group vitamins: Secondary | ICD-10-CM

## 2017-05-31 DIAGNOSIS — Z114 Encounter for screening for human immunodeficiency virus [HIV]: Secondary | ICD-10-CM | POA: Diagnosis not present

## 2017-05-31 DIAGNOSIS — Z0001 Encounter for general adult medical examination with abnormal findings: Secondary | ICD-10-CM

## 2017-05-31 DIAGNOSIS — R739 Hyperglycemia, unspecified: Secondary | ICD-10-CM

## 2017-05-31 LAB — CBC WITH DIFFERENTIAL/PLATELET
BASOS ABS: 0 10*3/uL (ref 0.0–0.1)
Basophils Relative: 0.4 % (ref 0.0–3.0)
Eosinophils Absolute: 0.2 10*3/uL (ref 0.0–0.7)
Eosinophils Relative: 2.8 % (ref 0.0–5.0)
HCT: 42.6 % (ref 36.0–46.0)
Hemoglobin: 14 g/dL (ref 12.0–15.0)
LYMPHS ABS: 2.4 10*3/uL (ref 0.7–4.0)
Lymphocytes Relative: 39.5 % (ref 12.0–46.0)
MCHC: 32.8 g/dL (ref 30.0–36.0)
MCV: 96.8 fl (ref 78.0–100.0)
MONO ABS: 0.4 10*3/uL (ref 0.1–1.0)
Monocytes Relative: 6.8 % (ref 3.0–12.0)
NEUTROS ABS: 3 10*3/uL (ref 1.4–7.7)
NEUTROS PCT: 50.5 % (ref 43.0–77.0)
PLATELETS: 252 10*3/uL (ref 150.0–400.0)
RBC: 4.4 Mil/uL (ref 3.87–5.11)
RDW: 14.4 % (ref 11.5–15.5)
WBC: 6 10*3/uL (ref 4.0–10.5)

## 2017-05-31 LAB — BASIC METABOLIC PANEL
BUN: 16 mg/dL (ref 6–23)
CALCIUM: 9.7 mg/dL (ref 8.4–10.5)
CO2: 27 meq/L (ref 19–32)
Chloride: 98 mEq/L (ref 96–112)
Creatinine, Ser: 0.69 mg/dL (ref 0.40–1.20)
GFR: 97.08 mL/min (ref >60.00)
Glucose, Bld: 92 mg/dL (ref 70–99)
Potassium: 4.2 mEq/L (ref 3.5–5.1)
SODIUM: 135 meq/L (ref 135–145)

## 2017-05-31 LAB — LIPID PANEL
CHOLESTEROL: 200 mg/dL (ref 0–200)
HDL: 68.9 mg/dL (ref >39.00)
LDL Cholesterol: 115 mg/dL — ABNORMAL HIGH (ref 0–99)
NonHDL: 131.34
TRIGLYCERIDES: 82 mg/dL (ref 0.0–149.0)
Total CHOL/HDL Ratio: 3
VLDL: 16.4 mg/dL (ref 0.0–40.0)

## 2017-05-31 LAB — HEPATIC FUNCTION PANEL
ALBUMIN: 4.3 g/dL (ref 3.5–5.2)
ALK PHOS: 71 U/L (ref 39–117)
ALT: 90 U/L — AB (ref 0–35)
AST: 43 U/L — AB (ref 0–37)
BILIRUBIN DIRECT: 0.2 mg/dL (ref 0.0–0.3)
TOTAL PROTEIN: 7.9 g/dL (ref 6.0–8.3)
Total Bilirubin: 0.7 mg/dL (ref 0.2–1.2)

## 2017-05-31 LAB — URINALYSIS, ROUTINE W REFLEX MICROSCOPIC
Bilirubin Urine: NEGATIVE
HGB URINE DIPSTICK: NEGATIVE
Ketones, ur: NEGATIVE
LEUKOCYTES UA: NEGATIVE
Nitrite: NEGATIVE
PH: 6.5 (ref 5.0–8.0)
Specific Gravity, Urine: 1.015 (ref 1.000–1.030)
TOTAL PROTEIN, URINE-UPE24: NEGATIVE
UROBILINOGEN UA: 0.2 (ref 0.0–1.0)
Urine Glucose: NEGATIVE

## 2017-05-31 LAB — VITAMIN D 25 HYDROXY (VIT D DEFICIENCY, FRACTURES): VITD: 19.06 ng/mL — AB (ref 30.00–100.00)

## 2017-05-31 LAB — T4, FREE: Free T4: 0.9 ng/dL (ref 0.60–1.60)

## 2017-05-31 LAB — TSH: TSH: 5.19 u[IU]/mL — AB (ref 0.35–4.50)

## 2017-05-31 LAB — HEMOGLOBIN A1C: HEMOGLOBIN A1C: 6 % (ref 4.6–6.5)

## 2017-05-31 LAB — VITAMIN B12: VITAMIN B 12: 405 pg/mL (ref 211–911)

## 2017-05-31 MED ORDER — LEVOTHYROXINE SODIUM 100 MCG PO TABS: 100.0000 ug | ORAL_TABLET | Freq: Every day | ORAL | 3 refills | Status: DC

## 2017-05-31 NOTE — Assessment & Plan Note (Signed)
stable overall by history and exam, recent data reviewed with pt, and pt to continue medical treatment as before,  to f/u any worsening symptoms or concerns Lab Results  Component Value Date   TSH 3.01 03/01/2016

## 2017-05-31 NOTE — Assessment & Plan Note (Signed)
stable overall,  and pt to continue medical treatment as before,  to f/u any worsening symptoms or concerns, for f/u a1c today

## 2017-05-31 NOTE — Assessment & Plan Note (Addendum)
With possible sleep apnea given body habitus and snoring; ok for pulm referral - r/o osa  In addition to the time spent performing CPE, I spent an additional 25 minutes face to face,in which greater than 50% of this time was spent in counseling and coordination of care for patient's acute illness as documented, including the differential dx, tx, further evaluation and other management of daytime sleepiness with snoring, obesity, lower abd pain, hyperglycemia and hypothryoidism

## 2017-05-31 NOTE — Progress Notes (Signed)
Subjective:    Patient ID: [name], female    DOB: 11-01-1970, 46 y.o.   MRN: [id number]  HPI  Here for wellness and f/u;  Overall doing ok;  Pt denies Chest pain, worsening SOB, DOE, wheezing, orthopnea, PND, worsening LE edema, palpitations, dizziness or syncope.  Pt denies neurological change such as new headache, facial or extremity weakness.  Pt denies polydipsia, polyuria, or low sugar symptoms. Pt states overall good compliance with treatment and medications, good tolerability, and has been trying to follow appropriate diet.  Pt denies worsening depressive symptoms, suicidal ideation or panic. No fever, night sweats, wt loss, loss of appetite, or other constitutional symptoms.  Pt states good ability with ADL's, has low fall risk, home safety reviewed and adequate, no other significant changes in hearing or vision, and not active with exercise. 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  Does have 3-4 days incidental low mid abd pain and soreness with mild intermittent dysuria and frequency.  Denies urinary symptoms such as urgency, flank pain, hematuria or n/v, fever, chills. Pt continues to have persistent but mild LBP without change in severity, bowel or bladder change, fever, wt loss,  worsening LE pain//weakness, gait change or falls, though does have persistent right foot numbness despite the recent back surgury.  Also has ongoing "brain fog" with memory lapses, fatigue, snoring and sleepiness associated with increasing wt. Denies hyper or hypo thyroid symptoms such as voice, skin or hair change. Wt Readings from Last 3 Encounters:  05/31/17 199 lb (90.3 kg)  01/22/17 199 lb (90.3 kg)  10/18/16 197 lb (89.4 kg)   Past Medical History:  Diagnosis Date  . Abdominal pain, epigastric 06/19/2010  . Allergy   . Cancer [facility])    thyroid cancer   . DEPRESSION 05/02/2010  .  HYPERTHYROIDISM 05/02/2010  . HYPOTHYROIDISM 05/02/2010  . INSOMNIA 05/02/2010  . NAUSEA 06/19/2010  . TRANSAMINASES, SERUM, ELEVATED 05/02/2010   Past Surgical History:  Procedure Laterality Date  . APPENDECTOMY    . CESAREAN SECTION     x 2  . CHOLECYSTECTOMY  2011  . LUMBAR LAMINECTOMY/DECOMPRESSION MICRODISCECTOMY N/A 01/13/2016   Procedure: MICRO LUMBAR DECOMPRESSION L5-S1, L4-L5;  Surgeon: Susa Day, MD;  Location: [facility];  Service: Orthopedics;  Laterality: N/A;  . s/p compound fx left femur s/p rod, now removed    . TUBAL LIGATION      reports that she has never smoked. She has never used smokeless tobacco. She reports that she drinks alcohol. She reports that she does not use drugs. family history includes Alcohol abuse in her father and mother; Breast cancer in her unknown relative; COPD in her other and other; Cancer in her paternal uncle; Depression in her mother; Diabetes in her father and other; Heart disease in her father and other; Hyperlipidemia in her father and other; Hypertension in her father and other; Stroke in her mother and other. Allergies  Allergen Reactions  . Sulfa Antibiotics Hives   Current Outpatient Prescriptions on File Prior to Visit  Medication Sig Dispense Refill  . fluconazole (DIFLUCAN) 150 MG tablet 2 tabs by mouth once weekly for 2 weeks 4 tablet 0  . terbinafine (LAMISIL) 1 % cream Apply 1 application topically 2 (two) times daily. 30 g 1   No current facility-administered medications on file prior to visit.    Review of Systems  Constitutional: Negative for other unusual diaphoresis, sweats, appetite or weight changes HENT: Negative for other worsening hearing loss, ear pain, facial swelling, mouth sores or neck stiffness.   Eyes: Negative for other worsening pain, redness or other visual disturbance.  Respiratory: Negative for other stridor or swelling Cardiovascular: Negative for other palpitations or other chest pain  Gastrointestinal:  Negative for worsening diarrhea or loose stools, blood in stool, distention or other pain Genitourinary: Negative for hematuria, flank pain or other change in urine volume.  Musculoskeletal: Negative for myalgias or other joint swelling.  Skin: Negative for other color change, or other wound or worsening drainage.  Neurological: Negative for other syncope or numbness. Hematological: Negative for other adenopathy or swelling Psychiatric/Behavioral: Negative for hallucinations, other worsening agitation, SI, self-injury, or new decreased concentration All other system neg per pt    Objective:   Physical Exam BP 128/84   Pulse 70   Temp 98.1 F (36.7 C) (Oral)   Ht 5\' 8"  (1.727 m)   Wt 199 lb (90.3 kg)   SpO2 98%   BMI 30.26 kg/m  VS noted,  Constitutional: Pt is oriented to person, place, and time. Appears well-developed and well-nourished, in no significant distress and comfortable Head: Normocephalic and atraumatic  Eyes: Conjunctivae and EOM are normal. Pupils are equal, round, and reactive to light Right Ear: External ear normal without discharge Left Ear: External ear normal without discharge Nose: Nose without discharge or deformity Mouth/Throat: Oropharynx is without other ulcerations and moist  Neck: Normal range of motion. Neck supple. No JVD present. No tracheal deviation present or significant neck LA or mass Cardiovascular: Normal rate, regular rhythm, normal heart sounds and intact distal pulses.   Pulmonary/Chest: WOB normal and breath sounds without rales or wheezing  Abdominal: Soft. Bowel sounds are normal. No HSM but with low mid abd tender without guarding or rebound. Spine: with mild persistent sensitive area to midline low lumbar without swelling or rash Musculoskeletal: Normal range of motion. Exhibits no edema Lymphadenopathy: Has no other cervical adenopathy.  Neurological: Pt is alert and oriented to person, place, and time. Pt has normal reflexes. No cranial  nerve deficit. Motor grossly intact, Gait intact Skin: Skin is warm and dry. No rash noted or new ulcerations Psychiatric:  Has normal mood and affect. Behavior is normal without agitation No other exam findings Lab Results  Component Value Date   WBC 5.7 03/01/2016   HGB 13.3 03/01/2016   HCT 40.1 03/01/2016   PLT 253.0 03/01/2016   GLUCOSE 98 03/01/2016   CHOL 185 03/01/2016   TRIG 66.0 03/01/2016   HDL 61.20 03/01/2016   LDLCALC 110 (H) 03/01/2016   ALT 50 (H) 03/01/2016   AST 24 03/01/2016   NA 137 03/01/2016   K 4.3 03/01/2016   CL 105 03/01/2016   CREATININE 0.81 03/01/2016   BUN 18 03/01/2016   CO2 24 03/01/2016   TSH 3.01 03/01/2016       Assessment & Plan:

## 2017-05-31 NOTE — Assessment & Plan Note (Signed)
By hx and exam c/w possible uti - for urine studies, may need antibx tx

## 2017-05-31 NOTE — Assessment & Plan Note (Signed)

## 2017-05-31 NOTE — Patient Instructions (Addendum)

## 2017-06-01 LAB — HIV ANTIBODY (ROUTINE TESTING W REFLEX): HIV 1&2 Ab, 4th Generation: NONREACTIVE

## 2017-06-03 ENCOUNTER — Other Ambulatory Visit: Payer: Self-pay | Admitting: Internal Medicine

## 2017-06-03 LAB — URINE CULTURE
MICRO NUMBER:: 81171435
SPECIMEN QUALITY: ADEQUATE

## 2017-06-03 MED ORDER — FLUCONAZOLE 150 MG PO TABS: ORAL_TABLET | ORAL | 1 refills | Status: DC

## 2017-06-03 MED ORDER — CIPROFLOXACIN HCL 500 MG PO TABS: 500.0000 mg | ORAL_TABLET | Freq: Two times a day (BID) | ORAL | 0 refills | Status: AC

## 2017-06-04 ENCOUNTER — Telehealth: Payer: Self-pay

## 2017-06-04 NOTE — Telephone Encounter (Signed)
Pt has been informed and expressed understanding.  

## 2017-06-04 NOTE — Telephone Encounter (Signed)
-----   Message from Biagio Borg, MD sent at 06/03/2017  9:30 PM EDT ----- Left message on MyChart, pt to cont same tx except  The test results show that your current treatment is OK, except the urine culture does show an infection.  We will send an antibiotic to the pharmacy.  You should hear from the office about this as well.Redmond Baseman to please inform pt, I will do rx

## 2017-12-27 DIAGNOSIS — K439 Ventral hernia without obstruction or gangrene: Secondary | ICD-10-CM | POA: Diagnosis not present

## 2018-02-09 DIAGNOSIS — R21 Rash and other nonspecific skin eruption: Secondary | ICD-10-CM | POA: Diagnosis not present

## 2018-07-08 ENCOUNTER — Other Ambulatory Visit: Payer: Self-pay | Admitting: Internal Medicine

## 2018-07-09 ENCOUNTER — Encounter: Payer: Self-pay | Admitting: Internal Medicine

## 2018-07-09 ENCOUNTER — Other Ambulatory Visit: Payer: Self-pay | Admitting: Internal Medicine

## 2018-07-09 ENCOUNTER — Ambulatory Visit (INDEPENDENT_AMBULATORY_CARE_PROVIDER_SITE_OTHER): Payer: BLUE CROSS/BLUE SHIELD | Admitting: Internal Medicine

## 2018-07-09 ENCOUNTER — Other Ambulatory Visit (INDEPENDENT_AMBULATORY_CARE_PROVIDER_SITE_OTHER): Payer: BLUE CROSS/BLUE SHIELD

## 2018-07-09 VITALS — BP 116/72 | HR 67 | Temp 98.3°F | Ht 68.0 in | Wt 166.0 lb

## 2018-07-09 DIAGNOSIS — Z23 Encounter for immunization: Secondary | ICD-10-CM | POA: Diagnosis not present

## 2018-07-09 DIAGNOSIS — Z79890 Hormone replacement therapy (postmenopausal): Secondary | ICD-10-CM | POA: Insufficient documentation

## 2018-07-09 DIAGNOSIS — E039 Hypothyroidism, unspecified: Secondary | ICD-10-CM

## 2018-07-09 DIAGNOSIS — E559 Vitamin D deficiency, unspecified: Secondary | ICD-10-CM | POA: Diagnosis not present

## 2018-07-09 DIAGNOSIS — N951 Menopausal and female climacteric states: Secondary | ICD-10-CM | POA: Diagnosis not present

## 2018-07-09 DIAGNOSIS — R739 Hyperglycemia, unspecified: Secondary | ICD-10-CM | POA: Diagnosis not present

## 2018-07-09 DIAGNOSIS — E785 Hyperlipidemia, unspecified: Secondary | ICD-10-CM

## 2018-07-09 DIAGNOSIS — Z0000 Encounter for general adult medical examination without abnormal findings: Secondary | ICD-10-CM | POA: Diagnosis not present

## 2018-07-09 DIAGNOSIS — J309 Allergic rhinitis, unspecified: Secondary | ICD-10-CM

## 2018-07-09 HISTORY — DX: Hyperlipidemia, unspecified: E78.5

## 2018-07-09 HISTORY — DX: Allergic rhinitis, unspecified: J30.9

## 2018-07-09 LAB — BASIC METABOLIC PANEL
BUN: 19 mg/dL (ref 6–23)
CHLORIDE: 101 meq/L (ref 96–112)
CO2: 30 mEq/L (ref 19–32)
Calcium: 10.2 mg/dL (ref 8.4–10.5)
Creatinine, Ser: 0.75 mg/dL (ref 0.40–1.20)
GFR: 87.76 mL/min (ref >60.00)
Glucose, Bld: 96 mg/dL (ref 70–99)
POTASSIUM: 4.3 meq/L (ref 3.5–5.1)
Sodium: 137 mEq/L (ref 135–145)

## 2018-07-09 LAB — CBC WITH DIFFERENTIAL/PLATELET
BASOS PCT: 0.7 % (ref 0.0–3.0)
Basophils Absolute: 0 10*3/uL (ref 0.0–0.1)
EOS ABS: 0.1 10*3/uL (ref 0.0–0.7)
EOS PCT: 2.5 % (ref 0.0–5.0)
HCT: 41.2 % (ref 36.0–46.0)
Hemoglobin: 13.7 g/dL (ref 12.0–15.0)
Lymphocytes Relative: 45.1 % (ref 12.0–46.0)
Lymphs Abs: 2.5 10*3/uL (ref 0.7–4.0)
MCHC: 33.4 g/dL (ref 30.0–36.0)
MCV: 96.1 fl (ref 78.0–100.0)
Monocytes Absolute: 0.4 10*3/uL (ref 0.1–1.0)
Monocytes Relative: 7.9 % (ref 3.0–12.0)
NEUTROS ABS: 2.4 10*3/uL (ref 1.4–7.7)
Neutrophils Relative %: 43.8 % (ref 43.0–77.0)
PLATELETS: 228 10*3/uL (ref 150.0–400.0)
RBC: 4.28 Mil/uL (ref 3.87–5.11)
RDW: 14.6 % (ref 11.5–15.5)
WBC: 5.5 10*3/uL (ref 4.0–10.5)

## 2018-07-09 LAB — LIPID PANEL
CHOL/HDL RATIO: 2
Cholesterol: 176 mg/dL (ref 0–200)
HDL: 77.1 mg/dL (ref >39.00)
LDL Cholesterol: 87 mg/dL (ref 0–99)
NONHDL: 98.87
Triglycerides: 57 mg/dL (ref 0.0–149.0)
VLDL: 11.4 mg/dL (ref 0.0–40.0)

## 2018-07-09 LAB — URINALYSIS, ROUTINE W REFLEX MICROSCOPIC
BILIRUBIN URINE: NEGATIVE
HGB URINE DIPSTICK: NEGATIVE
Ketones, ur: NEGATIVE
Nitrite: NEGATIVE
PH: 7 (ref 5.0–8.0)
RBC / HPF: NONE SEEN (ref 0–?)
Specific Gravity, Urine: 1.01 (ref 1.000–1.030)
Total Protein, Urine: NEGATIVE
Urine Glucose: NEGATIVE
Urobilinogen, UA: 0.2 (ref 0.0–1.0)

## 2018-07-09 LAB — T4, FREE: Free T4: 0.96 ng/dL (ref 0.60–1.60)

## 2018-07-09 LAB — FOLLICLE STIMULATING HORMONE: FSH: 112.1 m[IU]/mL

## 2018-07-09 LAB — HEPATIC FUNCTION PANEL
ALT: 52 U/L — AB (ref 0–35)
AST: 25 U/L (ref 0–37)
Albumin: 4.6 g/dL (ref 3.5–5.2)
Alkaline Phosphatase: 58 U/L (ref 39–117)
BILIRUBIN DIRECT: 0.1 mg/dL (ref 0.0–0.3)
BILIRUBIN TOTAL: 0.8 mg/dL (ref 0.2–1.2)
Total Protein: 8 g/dL (ref 6.0–8.3)

## 2018-07-09 LAB — TSH: TSH: 4.66 u[IU]/mL — ABNORMAL HIGH (ref 0.35–4.50)

## 2018-07-09 LAB — HEMOGLOBIN A1C: Hgb A1c MFr Bld: 5.6 % (ref 4.6–6.5)

## 2018-07-09 LAB — VITAMIN D 25 HYDROXY (VIT D DEFICIENCY, FRACTURES): VITD: 17.24 ng/mL — ABNORMAL LOW (ref 30.00–100.00)

## 2018-07-09 MED ORDER — LEVOTHYROXINE SODIUM 112 MCG PO TABS: 112.0000 ug | ORAL_TABLET | Freq: Every day | ORAL | 3 refills | Status: DC

## 2018-07-09 MED ORDER — LEVOTHYROXINE SODIUM 100 MCG PO TABS: 100.0000 ug | ORAL_TABLET | Freq: Every day | ORAL | 3 refills | Status: DC

## 2018-07-09 MED ORDER — VITAMIN D (ERGOCALCIFEROL) 1.25 MG (50000 UNIT) PO CAPS: 50000.0000 [IU] | ORAL_CAPSULE | ORAL | 0 refills | Status: DC

## 2018-07-09 MED ORDER — TRIAMCINOLONE ACETONIDE 55 MCG/ACT NA AERO: 2.0000 | INHALATION_SPRAY | Freq: Every day | NASAL | 12 refills | Status: DC

## 2018-07-09 NOTE — Patient Instructions (Addendum)
You had the flu shot today  Please take all new medication as prescribed - the nasacort  You can also take Mucinex (or it's generic off brand) for congestion, and tylenol as needed for pain.  Please continue all other medications as before, and refills have been done if requested.  Please have the pharmacy call with any other refills you may need.  Please continue your efforts at being more active, low cholesterol diet, and weight control.  You are otherwise up to date with prevention measures today.  Please keep your appointments with your specialists as you may have planned  Please go to the LAB in the Basement (turn left off the elevator) for the tests to be done today  You will be contacted by phone if any changes need to be made immediately.  Otherwise, you will receive a letter about your results with an explanation, but please check with MyChart first.  Please remember to sign up for MyChart if you have not done so, as this will be important to you in the future with finding out test results, communicating by private email, and scheduling acute appointments online when needed.  Please return in 1 year for your yearly visit, or sooner if needed, with Lab testing done 3-5 days before

## 2018-07-09 NOTE — Progress Notes (Signed)
Subjective:    Patient ID: [name], female    DOB: [birth year], 47 y.o.   MRN: [id number]  HPI  Here for wellness and f/u;  Overall doing ok;  Pt denies Chest pain, worsening SOB, DOE, wheezing, orthopnea, PND, worsening LE edema, palpitations, dizziness or syncope.  Pt denies neurological change such as new headache, facial or extremity weakness.  Pt denies polydipsia, polyuria, or low sugar symptoms. Pt states overall good compliance with treatment and medications, good tolerability, and has been trying to follow appropriate diet.  Pt denies worsening depressive symptoms, suicidal ideation or panic. No fever, night sweats, loss of appetite, or other constitutional symptoms.  Pt states good ability with ADL's, has low fall risk, home safety reviewed and adequate, no other significant changes in hearing or vision, and only occasionally active with exercise. Lost Much wt with better diet intentionally Wt Readings from Last 3 Encounters:  07/09/18 166 lb (75.3 kg)  05/31/17 199 lb (90.3 kg)  01/22/17 199 lb (90.3 kg)  Does have several wks ongoing nasal allergy symptoms with clearish congestion, itch and sneezing, without fever, pain, ST, cough, swelling or wheezing.  Denies hyper or hypo thyroid symptoms such as voice, skin or hair change.  No new complaints except also has several months of night sweats and emotional up and down - ? menopause Past Medical History:  Diagnosis Date  . Abdominal pain, epigastric 06/19/2010  . Allergic rhinitis 07/09/2018  . Allergy   . Cancer [facility])    thyroid cancer   . DEPRESSION 05/02/2010  . HLD (hyperlipidemia) 07/09/2018  . HYPERTHYROIDISM 05/02/2010  . HYPOTHYROIDISM 05/02/2010  . INSOMNIA 05/02/2010  . NAUSEA 06/19/2010  . TRANSAMINASES, SERUM, ELEVATED 05/02/2010   Past Surgical History:  Procedure Laterality Date  . APPENDECTOMY    . CESAREAN SECTION     x 2  . CHOLECYSTECTOMY  2011  . LUMBAR LAMINECTOMY/DECOMPRESSION MICRODISCECTOMY N/A  01/13/2016   Procedure: MICRO LUMBAR DECOMPRESSION L5-S1, L4-L5;  Surgeon: Susa Day, MD;  Location: [facility];  Service: Orthopedics;  Laterality: N/A;  . s/p compound fx left femur s/p rod, now removed    . TUBAL LIGATION      reports that she has never smoked. She has never used smokeless tobacco. She reports that she drinks alcohol. She reports that she does not use drugs. family history includes Alcohol abuse in her father and mother; Breast cancer in her unknown relative; COPD in her other and other; Cancer in her paternal uncle; Depression in her mother; Diabetes in her father and other; Heart disease in her father and other; Hyperlipidemia in her father and other; Hypertension in her father and other; Stroke in her mother and other. Allergies  Allergen Reactions  . Sulfa Antibiotics Hives   Current Outpatient Medications on File Prior to Visit  Medication Sig Dispense Refill  . fluconazole (DIFLUCAN) 150 MG tablet 1 tabs by mouth every 3 days as needed 2 tablet 1  . terbinafine (LAMISIL) 1 % cream Apply 1 application topically 2 (two) times daily. 30 g 1   No current facility-administered medications on file prior to visit.    Review of Systems Constitutional: Negative for other unusual diaphoresis, sweats, appetite or weight changes HENT: Negative for other worsening hearing loss, ear pain, facial swelling, mouth sores or neck stiffness.   Eyes: Negative for other worsening pain, redness or other visual disturbance.  Respiratory: Negative for other stridor or swelling Cardiovascular: Negative for other palpitations or other chest pain  Gastrointestinal:  Negative for worsening diarrhea or loose stools, blood in stool, distention or other pain Genitourinary: Negative for hematuria, flank pain or other change in urine volume.  Musculoskeletal: Negative for myalgias or other joint swelling.  Skin: Negative for other color change, or other wound or worsening drainage.  Neurological:  Negative for other syncope or numbness. Hematological: Negative for other adenopathy or swelling Psychiatric/Behavioral: Negative for hallucinations, other worsening agitation, SI, self-injury, or new decreased concentration All other system neg per pt    Objective:   Physical Exam BP 116/72   Pulse 67   Temp 98.3 F (36.8 C) (Oral)   Ht 5\' 8"  (1.727 m)   Wt 166 lb (75.3 kg)   SpO2 96%   BMI 25.24 kg/m  VS noted,  Constitutional: Pt is oriented to person, place, and time. Appears well-developed and well-nourished, in no significant distress and comfortable Head: Normocephalic and atraumatic  Eyes: Conjunctivae and EOM are normal. Pupils are equal, round, and reactive to light Right Ear: External ear normal without discharge Left Ear: External ear normal without discharge Nose: Nose without discharge or deformity Mouth/Throat: Oropharynx is without other ulcerations and moist  Neck: Normal range of motion. Neck supple. No JVD present. No tracheal deviation present or significant neck LA or mass Cardiovascular: Normal rate, regular rhythm, normal heart sounds and intact distal pulses.   Pulmonary/Chest: WOB normal and breath sounds without rales or wheezing  Abdominal: Soft. Bowel sounds are normal. NT. No HSM  Musculoskeletal: Normal range of motion. Exhibits no edema Lymphadenopathy: Has no other cervical adenopathy.  Neurological: Pt is alert and oriented to person, place, and time. Pt has normal reflexes. No cranial nerve deficit. Motor grossly intact, Gait intact Skin: Skin is warm and dry. No rash noted or new ulcerations Psychiatric:  Has normal mood and affect. Behavior is normal without agitation No other exam findings Lab Results  Component Value Date   WBC 5.5 07/09/2018   HGB 13.7 07/09/2018   HCT 41.2 07/09/2018   PLT 228.0 07/09/2018   GLUCOSE 96 07/09/2018   CHOL 176 07/09/2018   TRIG 57.0 07/09/2018   HDL 77.10 07/09/2018   LDLCALC 87 07/09/2018   ALT 52 (H)  07/09/2018   AST 25 07/09/2018   NA 137 07/09/2018   K 4.3 07/09/2018   CL 101 07/09/2018   CREATININE 0.75 07/09/2018   BUN 19 07/09/2018   CO2 30 07/09/2018   TSH 4.66 (H) 07/09/2018   HGBA1C 5.6 07/09/2018       Assessment & Plan:

## 2018-07-10 NOTE — Assessment & Plan Note (Signed)
Mild to mod, for nasacort and mucinex otc prn,  to f/u any worsening symptoms or concerns

## 2018-07-10 NOTE — Assessment & Plan Note (Signed)

## 2018-07-10 NOTE — Assessment & Plan Note (Signed)
stable overall by history and exam, recent data reviewed with pt, and pt to continue medical treatment as before,  to f/u any worsening symptoms or concerns  

## 2018-07-10 NOTE — Assessment & Plan Note (Signed)
Ok for vit d 2000 u per day indefinitely

## 2018-07-10 NOTE — Assessment & Plan Note (Signed)
Lab Results  Component Value Date   LDLCALC 87 07/09/2018  stable overall by history and exam, recent data reviewed with pt, and pt to continue medical treatment as before,  to f/u any worsening symptoms or concerns

## 2018-07-10 NOTE — Assessment & Plan Note (Signed)
[place] [facility] [employer]

## 2018-07-14 ENCOUNTER — Telehealth: Payer: Self-pay

## 2018-07-14 NOTE — Telephone Encounter (Signed)
Pt has viewed results via MyChart  

## 2018-07-14 NOTE — Telephone Encounter (Signed)
-----   Message from Biagio Borg, MD sent at 07/09/2018 12:44 PM EST ----- Left message on MyChart, pt to cont same tx except  he test results show that your current treatment is OK, except the Vitamin D is low, the thyroid testing is consistent with very mild low thyroid condition, and the [place] (menopause hormone) is very high and indeed consistent with definite menopause.  Please start Vitamin D 50000 units weekly for 12 weeks, and then start Vit D 2000 units per day after that indefinitely  We should increase the thyroid medication from 100 to 112 mcg per day, and I will send a new prescription.  You may wish to discuss the menopause with your GYN if you are having symptoms    Shirron to please inform pt, I will do rx x 2

## 2018-08-28 DIAGNOSIS — S20211A Contusion of right front wall of thorax, initial encounter: Secondary | ICD-10-CM | POA: Diagnosis not present

## 2018-09-17 DIAGNOSIS — Z6825 Body mass index (BMI) 25.0-25.9, adult: Secondary | ICD-10-CM | POA: Diagnosis not present

## 2018-09-17 DIAGNOSIS — Z124 Encounter for screening for malignant neoplasm of cervix: Secondary | ICD-10-CM | POA: Diagnosis not present

## 2018-09-17 DIAGNOSIS — Z01419 Encounter for gynecological examination (general) (routine) without abnormal findings: Secondary | ICD-10-CM | POA: Diagnosis not present

## 2018-09-17 DIAGNOSIS — N951 Menopausal and female climacteric states: Secondary | ICD-10-CM | POA: Diagnosis not present

## 2018-09-17 DIAGNOSIS — N393 Stress incontinence (female) (male): Secondary | ICD-10-CM | POA: Diagnosis not present

## 2018-11-04 ENCOUNTER — Encounter: Payer: Self-pay | Admitting: Internal Medicine

## 2018-11-04 ENCOUNTER — Other Ambulatory Visit: Payer: Self-pay

## 2018-11-04 ENCOUNTER — Telehealth: Payer: Self-pay

## 2018-11-04 ENCOUNTER — Ambulatory Visit (INDEPENDENT_AMBULATORY_CARE_PROVIDER_SITE_OTHER): Payer: BLUE CROSS/BLUE SHIELD | Admitting: Internal Medicine

## 2018-11-04 VITALS — BP 126/84 | HR 67 | Temp 98.6°F | Ht 68.0 in

## 2018-11-04 DIAGNOSIS — J019 Acute sinusitis, unspecified: Secondary | ICD-10-CM | POA: Insufficient documentation

## 2018-11-04 DIAGNOSIS — R739 Hyperglycemia, unspecified: Secondary | ICD-10-CM

## 2018-11-04 DIAGNOSIS — F329 Major depressive disorder, single episode, unspecified: Secondary | ICD-10-CM | POA: Diagnosis not present

## 2018-11-04 DIAGNOSIS — F32A Depression, unspecified: Secondary | ICD-10-CM

## 2018-11-04 MED ORDER — LEVOFLOXACIN 500 MG PO TABS: 500.0000 mg | ORAL_TABLET | Freq: Every day | ORAL | 0 refills | Status: AC

## 2018-11-04 NOTE — Telephone Encounter (Signed)
I have talked with patient, afebrile, diarrhea since weekend, but has continued to stay well hydrated---greenish colored sputum/drainage with head and chest congestion--slight cough---patient's workplace is requiring work note in order for her to return to work and to explain her absence---patient advised to call [place] office to advise when she arrives, we will call her back when it's time to come in for her visit, patient advised to put mask on upon entry to office----can ask for Kaaren Nass,RN if any further questions

## 2018-11-04 NOTE — Telephone Encounter (Signed)
Pt triaged for covid-19 symptoms  Pt stated that has been having a minor cough, SOB and diarrhea since the weekend. No fever or nausea. Pt stated that she has been taking her temp.  I informed her that I would be forwarding this information over to our clinical RN and she would follow up with the patient with additional questions. Pt expressed understanding.

## 2018-11-04 NOTE — Assessment & Plan Note (Signed)
stable overall by history and exam, recent data reviewed with pt, and pt to continue medical treatment as before,  to f/u any worsening symptoms or concerns  

## 2018-11-04 NOTE — Patient Instructions (Signed)
Please take all new medication as prescribed - the antibiotic  You are given the work note today  Please continue all other medications as before, and refills have been done if requested.  Please have the pharmacy call with any other refills you may need.  Please keep your appointments with your specialists as you may have planned    

## 2018-11-04 NOTE — Progress Notes (Signed)
Subjective:    Patient ID: [name], female    DOB: 30-Jan-1971, 48 y.o.   MRN: [id number]  HPI    Here with 2-3 days acute onset fever, facial pain, pressure, headache, general weakness and malaise, and greenish d/c, with mild ST and cough, but pt denies chest pain, wheezing, increased sob or doe, orthopnea, PND, increased LE swelling, palpitations, dizziness or syncope.  Pt denies new neurological symptoms such as new headache, or facial or extremity weakness or numbness   Pt denies polydipsia, polyuria.  Denies worsening depressive symptoms, suicidal ideation, or panic Past Medical History:  Diagnosis Date  . Abdominal pain, epigastric 06/19/2010  . Allergic rhinitis 07/09/2018  . Allergy   . Cancer [facility])    thyroid cancer   . DEPRESSION 05/02/2010  . HLD (hyperlipidemia) 07/09/2018  . HYPERTHYROIDISM 05/02/2010  . HYPOTHYROIDISM 05/02/2010  . INSOMNIA 05/02/2010  . NAUSEA 06/19/2010  . TRANSAMINASES, SERUM, ELEVATED 05/02/2010   Past Surgical History:  Procedure Laterality Date  . APPENDECTOMY    . CESAREAN SECTION     x 2  . CHOLECYSTECTOMY  2011  . LUMBAR LAMINECTOMY/DECOMPRESSION MICRODISCECTOMY N/A 01/13/2016   Procedure: MICRO LUMBAR DECOMPRESSION L5-S1, L4-L5;  Surgeon: Susa Day, MD;  Location: [facility];  Service: Orthopedics;  Laterality: N/A;  . s/p compound fx left femur s/p rod, now removed    . TUBAL LIGATION      reports that she has never smoked. She has never used smokeless tobacco. She reports current alcohol use. She reports that she does not use drugs. family history includes Alcohol abuse in her father and mother; Breast cancer in her unknown relative; COPD in some other family members; Cancer in her paternal uncle; Depression in her mother; Diabetes in her father and another family member; Heart disease in her father and another family member; Hyperlipidemia in her father and another family member; Hypertension in her father and another family member; Stroke in  her mother and another family member. Allergies  Allergen Reactions  . Sulfa Antibiotics Hives   Current Outpatient Medications on File Prior to Visit  Medication Sig Dispense Refill  . fluconazole (DIFLUCAN) 150 MG tablet 1 tabs by mouth every 3 days as needed 2 tablet 1  . levothyroxine (SYNTHROID, LEVOTHROID) 112 MCG tablet Take 1 tablet (112 mcg total) by mouth daily before breakfast. 90 tablet 3  . terbinafine (LAMISIL) 1 % cream Apply 1 application topically 2 (two) times daily. 30 g 1  . triamcinolone (NASACORT) 55 MCG/ACT AERO nasal inhaler Place 2 sprays into the nose daily. 1 Inhaler 12  . Vitamin D, Ergocalciferol, (DRISDOL) 1.25 MG (50000 UT) CAPS capsule Take 1 capsule (50,000 Units total) by mouth every 7 (seven) days. 12 capsule 0   No current facility-administered medications on file prior to visit.    Review of Systems  Constitutional: Negative for other unusual diaphoresis or sweats HENT: Negative for ear discharge or swelling Eyes: Negative for other worsening visual disturbances Respiratory: Negative for stridor or other swelling  Gastrointestinal: Negative for worsening distension or other blood Genitourinary: Negative for retention or other urinary change Musculoskeletal: Negative for other MSK pain or swelling Skin: Negative for color change or other new lesions Neurological: Negative for worsening tremors and other numbness  Psychiatric/Behavioral: Negative for worsening agitation or other fatigue All other system neg per pt    Objective:   Physical Exam BP 126/84   Pulse 67   Temp 98.6 F (37 C) (Oral)   Ht 5'  8" (1.727 m)   SpO2 97%   BMI 25.24 kg/m  VS noted,  Constitutional: Pt appears in NAD HENT: Head: NCAT.  Right Ear: External ear normal.  Left Ear: External ear normal.  Eyes: . Pupils are equal, round, and reactive to light. Conjunctivae and EOM are normal Nose: without d/c or deformity Neck: Neck supple. Gross normal ROM Cardiovascular:  Normal rate and regular rhythm.   Pulmonary/Chest: Effort normal and breath sounds without rales or wheezing.  Abd:  Soft, NT, ND, + BS, no organomegaly Neurological: Pt is alert. At baseline orientation, motor grossly intact Skin: Skin is warm. No rashes, other new lesions, no LE edema Psychiatric: Pt behavior is normal without agitation , not depressed affect No other exam findings Lab Results  Component Value Date   WBC 5.5 07/09/2018   HGB 13.7 07/09/2018   HCT 41.2 07/09/2018   PLT 228.0 07/09/2018   GLUCOSE 96 07/09/2018   CHOL 176 07/09/2018   TRIG 57.0 07/09/2018   HDL 77.10 07/09/2018   LDLCALC 87 07/09/2018   ALT 52 (H) 07/09/2018   AST 25 07/09/2018   NA 137 07/09/2018   K 4.3 07/09/2018   CL 101 07/09/2018   CREATININE 0.75 07/09/2018   BUN 19 07/09/2018   CO2 30 07/09/2018   TSH 4.66 (H) 07/09/2018   HGBA1C 5.6 07/09/2018        Assessment & Plan:

## 2018-11-04 NOTE — Assessment & Plan Note (Signed)
Mild to mod, for antibx course,  to f/u any worsening symptoms or concerns 

## 2019-06-25 ENCOUNTER — Encounter: Payer: Self-pay | Admitting: Internal Medicine

## 2019-06-25 ENCOUNTER — Other Ambulatory Visit: Payer: Self-pay

## 2019-06-25 ENCOUNTER — Other Ambulatory Visit (INDEPENDENT_AMBULATORY_CARE_PROVIDER_SITE_OTHER): Payer: BC Managed Care – PPO

## 2019-06-25 ENCOUNTER — Ambulatory Visit (INDEPENDENT_AMBULATORY_CARE_PROVIDER_SITE_OTHER): Payer: BC Managed Care – PPO | Admitting: Internal Medicine

## 2019-06-25 VITALS — BP 116/70 | HR 69 | Temp 98.4°F | Ht 68.0 in | Wt 168.0 lb

## 2019-06-25 DIAGNOSIS — Z23 Encounter for immunization: Secondary | ICD-10-CM

## 2019-06-25 DIAGNOSIS — R399 Unspecified symptoms and signs involving the genitourinary system: Secondary | ICD-10-CM

## 2019-06-25 DIAGNOSIS — R300 Dysuria: Secondary | ICD-10-CM | POA: Insufficient documentation

## 2019-06-25 DIAGNOSIS — F329 Major depressive disorder, single episode, unspecified: Secondary | ICD-10-CM | POA: Diagnosis not present

## 2019-06-25 DIAGNOSIS — F32A Depression, unspecified: Secondary | ICD-10-CM

## 2019-06-25 DIAGNOSIS — R739 Hyperglycemia, unspecified: Secondary | ICD-10-CM

## 2019-06-25 LAB — URINALYSIS, ROUTINE W REFLEX MICROSCOPIC
Bilirubin Urine: NEGATIVE
Ketones, ur: NEGATIVE
Nitrite: NEGATIVE
Specific Gravity, Urine: 1.015 (ref 1.000–1.030)
Urine Glucose: NEGATIVE
Urobilinogen, UA: 0.2 (ref 0.0–1.0)
pH: 7 (ref 5.0–8.0)

## 2019-06-25 MED ORDER — CEFTRIAXONE SODIUM 1 G IJ SOLR
1.0000 g | Freq: Once | INTRAMUSCULAR | Status: AC
  Administered 2019-06-25: 1 g via INTRAMUSCULAR

## 2019-06-25 MED ORDER — CIPROFLOXACIN HCL 500 MG PO TABS: 500.0000 mg | ORAL_TABLET | Freq: Two times a day (BID) | ORAL | 0 refills | Status: AC

## 2019-06-25 MED ORDER — KETOROLAC TROMETHAMINE 30 MG/ML IJ SOLN
30.0000 mg | Freq: Once | INTRAMUSCULAR | Status: AC
  Administered 2019-06-25: 30 mg via INTRAMUSCULAR

## 2019-06-25 NOTE — Assessment & Plan Note (Signed)
stable overall by history and exam, recent data reviewed with pt, and pt to continue medical treatment as before,  to f/u any worsening symptoms or concerns  

## 2019-06-25 NOTE — Progress Notes (Signed)
Subjective:    Patient ID: [name], female    DOB: 29-Jan-1971, 48 y.o.   MRN: [id number]  HPI  Here with  2-3 days onset nausea with dysuria, freq, low abd pain but no urgency, flank pain, hematuria or vomiting, fever, chills.  Azo no help.  Pt denies chest pain, increased sob or doe, wheezing, orthopnea, PND, increased LE swelling, palpitations, dizziness or syncope.  Pt denies new neurological symptoms such as new headache, or facial or extremity weakness or numbness   Pt denies polydipsia, polyuria,  Denies worsening depressive symptoms, suicidal ideation, or panic Past Medical History:  Diagnosis Date  . Abdominal pain, epigastric 06/19/2010  . Allergic rhinitis 07/09/2018  . Allergy   . Cancer [facility])    thyroid cancer   . DEPRESSION 05/02/2010  . HLD (hyperlipidemia) 07/09/2018  . HYPERTHYROIDISM 05/02/2010  . HYPOTHYROIDISM 05/02/2010  . INSOMNIA 05/02/2010  . NAUSEA 06/19/2010  . TRANSAMINASES, SERUM, ELEVATED 05/02/2010   Past Surgical History:  Procedure Laterality Date  . APPENDECTOMY    . CESAREAN SECTION     x 2  . CHOLECYSTECTOMY  2011  . LUMBAR LAMINECTOMY/DECOMPRESSION MICRODISCECTOMY N/A 01/13/2016   Procedure: MICRO LUMBAR DECOMPRESSION L5-S1, L4-L5;  Surgeon: Susa Day, MD;  Location: [facility];  Service: Orthopedics;  Laterality: N/A;  . s/p compound fx left femur s/p rod, now removed    . TUBAL LIGATION      reports that she has never smoked. She has never used smokeless tobacco. She reports current alcohol use. She reports that she does not use drugs. family history includes Alcohol abuse in her father and mother; Breast cancer in her unknown relative; COPD in some other family members; Cancer in her paternal uncle; Depression in her mother; Diabetes in her father and another family member; Heart disease in her father and another family member; Hyperlipidemia in her father and another family member; Hypertension in her father and another family member; Stroke in  her mother and another family member. Allergies  Allergen Reactions  . Sulfa Antibiotics Hives   Current Outpatient Medications on File Prior to Visit  Medication Sig Dispense Refill  . fluconazole (DIFLUCAN) 150 MG tablet 1 tabs by mouth every 3 days as needed 2 tablet 1  . levothyroxine (SYNTHROID, LEVOTHROID) 112 MCG tablet Take 1 tablet (112 mcg total) by mouth daily before breakfast. 90 tablet 3  . terbinafine (LAMISIL) 1 % cream Apply 1 application topically 2 (two) times daily. 30 g 1  . triamcinolone (NASACORT) 55 MCG/ACT AERO nasal inhaler Place 2 sprays into the nose daily. 1 Inhaler 12   No current facility-administered medications on file prior to visit.    Review of Systems  Constitutional: Negative for other unusual diaphoresis or sweats HENT: Negative for ear discharge or swelling Eyes: Negative for other worsening visual disturbances Respiratory: Negative for stridor or other swelling  Gastrointestinal: Negative for worsening distension or other blood Genitourinary: Negative for retention or other urinary change Musculoskeletal: Negative for other MSK pain or swelling Skin: Negative for color change or other new lesions Neurological: Negative for worsening tremors and other numbness  Psychiatric/Behavioral: Negative for worsening agitation or other fatigue All otherwise neg per pt     Objective:   Physical Exam BP 116/70   Pulse 69   Temp 98.4 F (36.9 C) (Oral)   Ht 5\' 8"  (1.727 m)   Wt 168 lb (76.2 kg)   SpO2 97%   BMI 25.54 kg/m  VS noted, mild ill Constitutional:  Pt appears in NAD HENT: Head: NCAT.  Right Ear: External ear normal.  Left Ear: External ear normal.  Eyes: . Pupils are equal, round, and reactive to light. Conjunctivae and EOM are normal Nose: without d/c or deformity Neck: Neck supple. Gross normal ROM Cardiovascular: Normal rate and regular rhythm.   Pulmonary/Chest: Effort normal and breath sounds without rales or wheezing.  Abd:   Soft, ND, + BS, no organomegaly with tender low mid abdomen without guarding or rebound Neurological: Pt is alert. At baseline orientation, motor grossly intact Skin: Skin is warm. No rashes, other new lesions, no LE edema Psychiatric: Pt behavior is normal without agitation  All otherwise neg per pt Lab Results  Component Value Date   WBC 5.5 07/09/2018   HGB 13.7 07/09/2018   HCT 41.2 07/09/2018   PLT 228.0 07/09/2018   GLUCOSE 96 07/09/2018   CHOL 176 07/09/2018   TRIG 57.0 07/09/2018   HDL 77.10 07/09/2018   LDLCALC 87 07/09/2018   ALT 52 (H) 07/09/2018   AST 25 07/09/2018   NA 137 07/09/2018   K 4.3 07/09/2018   CL 101 07/09/2018   CREATININE 0.75 07/09/2018   BUN 19 07/09/2018   CO2 30 07/09/2018   TSH 4.66 (H) 07/09/2018   HGBA1C 5.6 07/09/2018       Assessment & Plan:

## 2019-06-25 NOTE — Patient Instructions (Signed)
You had the antibiotic (rocephin) and the pain (toradol) shots today  Please take all new medication as prescribed - the cipro antibiotic  Please continue all other medications as before, and refills have been done if requested.  Please have the pharmacy call with any other refills you may need.  Please continue your efforts at being more active, low cholesterol diet, and weight control.  Please keep your appointments with your specialists as you may have planned  We will try to let you know about the urine culture in a few days

## 2019-06-25 NOTE — Assessment & Plan Note (Signed)
Likely uti, for urine studies, empiric antibiotic, f/u culture results

## 2019-06-27 LAB — URINE CULTURE
MICRO NUMBER:: 1093783
SPECIMEN QUALITY:: ADEQUATE

## 2019-07-02 ENCOUNTER — Other Ambulatory Visit: Payer: Self-pay | Admitting: Internal Medicine

## 2019-09-23 ENCOUNTER — Other Ambulatory Visit: Payer: Self-pay | Admitting: Internal Medicine

## 2019-11-07 ENCOUNTER — Other Ambulatory Visit: Payer: Self-pay | Admitting: Internal Medicine

## 2019-11-07 NOTE — Telephone Encounter (Signed)
Please refill as per office routine med refill policy (all routine meds refilled for 3 mo or monthly per pt preference up to one year from last visit, then month to month grace period for 3 mo, then further med refills will have to be denied)  

## 2019-11-09 MED ORDER — LEVOTHYROXINE SODIUM 112 MCG PO TABS: 112.0000 ug | ORAL_TABLET | Freq: Every day | ORAL | 0 refills | Status: DC

## 2019-11-09 NOTE — Addendum Note (Signed)
Addended by: Earnstine Regal on: 11/09/2019 11:58 AM   Modules accepted: Orders

## 2019-11-09 NOTE — Telephone Encounter (Signed)
Faxed through [email or web address]

## 2019-11-30 ENCOUNTER — Encounter: Payer: Self-pay | Admitting: Internal Medicine

## 2019-11-30 ENCOUNTER — Other Ambulatory Visit: Payer: Self-pay

## 2019-11-30 ENCOUNTER — Ambulatory Visit (INDEPENDENT_AMBULATORY_CARE_PROVIDER_SITE_OTHER): Payer: BC Managed Care – PPO | Admitting: Internal Medicine

## 2019-11-30 ENCOUNTER — Other Ambulatory Visit (INDEPENDENT_AMBULATORY_CARE_PROVIDER_SITE_OTHER): Payer: BC Managed Care – PPO

## 2019-11-30 VITALS — BP 118/72 | HR 66 | Temp 98.0°F | Resp 16 | Ht 68.0 in | Wt 168.8 lb

## 2019-11-30 DIAGNOSIS — Z0000 Encounter for general adult medical examination without abnormal findings: Secondary | ICD-10-CM

## 2019-11-30 DIAGNOSIS — R739 Hyperglycemia, unspecified: Secondary | ICD-10-CM

## 2019-11-30 DIAGNOSIS — E559 Vitamin D deficiency, unspecified: Secondary | ICD-10-CM

## 2019-11-30 DIAGNOSIS — E538 Deficiency of other specified B group vitamins: Secondary | ICD-10-CM

## 2019-11-30 DIAGNOSIS — E039 Hypothyroidism, unspecified: Secondary | ICD-10-CM | POA: Diagnosis not present

## 2019-11-30 LAB — BASIC METABOLIC PANEL
BUN: 16 mg/dL (ref 6–23)
CO2: 29 mEq/L (ref 19–32)
Calcium: 9.6 mg/dL (ref 8.4–10.5)
Chloride: 100 mEq/L (ref 96–112)
Creatinine, Ser: 0.69 mg/dL (ref 0.40–1.20)
GFR: 90.38 mL/min (ref >60.00)
Glucose, Bld: 88 mg/dL (ref 70–99)
Potassium: 4 mEq/L (ref 3.5–5.1)
Sodium: 135 mEq/L (ref 135–145)

## 2019-11-30 LAB — HEPATIC FUNCTION PANEL
ALT: 19 U/L (ref 0–35)
AST: 21 U/L (ref 0–37)
Albumin: 4.4 g/dL (ref 3.5–5.2)
Alkaline Phosphatase: 63 U/L (ref 39–117)
Bilirubin, Direct: 0.1 mg/dL (ref 0.0–0.3)
Total Bilirubin: 0.8 mg/dL (ref 0.2–1.2)
Total Protein: 7.4 g/dL (ref 6.0–8.3)

## 2019-11-30 LAB — CBC WITH DIFFERENTIAL/PLATELET
Basophils Absolute: 0 10*3/uL (ref 0.0–0.1)
Basophils Relative: 0.6 % (ref 0.0–3.0)
Eosinophils Absolute: 0.1 10*3/uL (ref 0.0–0.7)
Eosinophils Relative: 1.9 % (ref 0.0–5.0)
HCT: 40 % (ref 36.0–46.0)
Hemoglobin: 13.4 g/dL (ref 12.0–15.0)
Lymphocytes Relative: 45.6 % (ref 12.0–46.0)
Lymphs Abs: 2.4 10*3/uL (ref 0.7–4.0)
MCHC: 33.5 g/dL (ref 30.0–36.0)
MCV: 96 fl (ref 78.0–100.0)
Monocytes Absolute: 0.3 10*3/uL (ref 0.1–1.0)
Monocytes Relative: 6.8 % (ref 3.0–12.0)
Neutro Abs: 2.3 10*3/uL (ref 1.4–7.7)
Neutrophils Relative %: 45.1 % (ref 43.0–77.0)
Platelets: 209 10*3/uL (ref 150.0–400.0)
RBC: 4.16 Mil/uL (ref 3.87–5.11)
RDW: 13.8 % (ref 11.5–15.5)
WBC: 5.2 10*3/uL (ref 4.0–10.5)

## 2019-11-30 LAB — LIPID PANEL
Cholesterol: 166 mg/dL (ref 0–200)
HDL: 81.3 mg/dL (ref >39.00)
LDL Cholesterol: 74 mg/dL (ref 0–99)
NonHDL: 84.64
Total CHOL/HDL Ratio: 2
Triglycerides: 53 mg/dL (ref 0.0–149.0)
VLDL: 10.6 mg/dL (ref 0.0–40.0)

## 2019-11-30 NOTE — Assessment & Plan Note (Signed)
stable overall by history and exam, recent data reviewed with pt, and pt to continue medical treatment as before,  to f/u any worsening symptoms or concerns  

## 2019-11-30 NOTE — Assessment & Plan Note (Signed)
For fu lab,  to f/u any worsening symptoms or concerns  

## 2019-11-30 NOTE — Progress Notes (Signed)
Subjective:    Patient ID: [name], female    DOB: 09/28/1970, 49 y.o.   MRN: [id number]  HPI  Here for wellness and f/u;  Overall doing ok;  Pt denies Chest pain, worsening SOB, DOE, wheezing, orthopnea, PND, worsening LE edema, palpitations, dizziness or syncope.  Pt denies neurological change such as new headache, facial or extremity weakness.  Pt denies polydipsia, polyuria, or low sugar symptoms. Pt states overall good compliance with treatment and medications, good tolerability, and has been trying to follow appropriate diet.  Pt denies worsening depressive symptoms, suicidal ideation or panic. No fever, night sweats, wt loss, loss of appetite, or other constitutional symptoms.  Pt states good ability with ADL's, has low fall risk, home safety reviewed and adequate, no other significant changes in hearing or vision.  No new complaints Past Medical History:  Diagnosis Date  . Abdominal pain, epigastric 06/19/2010  . Allergic rhinitis 07/09/2018  . Allergy   . Cancer [facility])    thyroid cancer   . DEPRESSION 05/02/2010  . HLD (hyperlipidemia) 07/09/2018  . HYPERTHYROIDISM 05/02/2010  . HYPOTHYROIDISM 05/02/2010  . INSOMNIA 05/02/2010  . NAUSEA 06/19/2010  . TRANSAMINASES, SERUM, ELEVATED 05/02/2010   Past Surgical History:  Procedure Laterality Date  . APPENDECTOMY    . CESAREAN SECTION     x 2  . CHOLECYSTECTOMY  2011  . LUMBAR LAMINECTOMY/DECOMPRESSION MICRODISCECTOMY N/A 01/13/2016   Procedure: MICRO LUMBAR DECOMPRESSION L5-S1, L4-L5;  Surgeon: Susa Day, MD;  Location: [facility];  Service: Orthopedics;  Laterality: N/A;  . s/p compound fx left femur s/p rod, now removed    . TUBAL LIGATION      reports that she has never smoked. She has never used smokeless tobacco. She reports current alcohol use. She reports that she does not use drugs. family history includes Alcohol abuse in her father and mother; Breast cancer in her unknown relative; COPD in some other family members;  Cancer in her paternal uncle; Depression in her mother; Diabetes in her father and another family member; Heart disease in her father and another family member; Hyperlipidemia in her father and another family member; Hypertension in her father and another family member; Stroke in her mother and another family member. Allergies  Allergen Reactions  . Sulfa Antibiotics Hives   Current Outpatient Medications on File Prior to Visit  Medication Sig Dispense Refill  . levothyroxine (SYNTHROID) 112 MCG tablet Take 1 tablet (112 mcg total) by mouth daily before breakfast. Annual appt is due w/.labs must see provider for future refills 30 tablet 0   No current facility-administered medications on file prior to visit.   Review of Systems All otherwise neg per pt     Objective:   Physical Exam BP 118/72 (BP Location: Right Arm, Patient Position: Sitting, Cuff Size: Normal)   Pulse 66   Temp 98 F (36.7 C) (Oral)   Resp 16   Ht 5\' 8"  (1.727 m)   Wt 168 lb 12.8 oz (76.6 kg)   SpO2 98%   BMI 25.67 kg/m  VS noted,  Constitutional: Pt appears in NAD HENT: Head: NCAT.  Right Ear: External ear normal.  Left Ear: External ear normal.  Eyes: . Pupils are equal, round, and reactive to light. Conjunctivae and EOM are normal Nose: without d/c or deformity Neck: Neck supple. Gross normal ROM Cardiovascular: Normal rate and regular rhythm.   Pulmonary/Chest: Effort normal and breath sounds without rales or wheezing.  Abd:  Soft, NT, ND, + BS,  no organomegaly Neurological: Pt is alert. At baseline orientation, motor grossly intact Skin: Skin is warm. No rashes, other new lesions, no LE edema Psychiatric: Pt behavior is normal without agitation  All otherwise neg per pt  Lab Results  Component Value Date   WBC 5.5 07/09/2018   HGB 13.7 07/09/2018   HCT 41.2 07/09/2018   PLT 228.0 07/09/2018   GLUCOSE 96 07/09/2018   CHOL 176 07/09/2018   TRIG 57.0 07/09/2018   HDL 77.10 07/09/2018   LDLCALC  87 07/09/2018   ALT 52 (H) 07/09/2018   AST 25 07/09/2018   NA 137 07/09/2018   K 4.3 07/09/2018   CL 101 07/09/2018   CREATININE 0.75 07/09/2018   BUN 19 07/09/2018   CO2 30 07/09/2018   TSH 4.66 (H) 07/09/2018   HGBA1C 5.6 07/09/2018        Assessment & Plan:

## 2019-11-30 NOTE — Assessment & Plan Note (Signed)

## 2019-11-30 NOTE — Patient Instructions (Addendum)
Please consider [facility] for routine wellness care  Please continue all other medications as before, and refills have been done if requested.  Please have the pharmacy call with any other refills you may need.  Please continue your efforts at being more active, low cholesterol diet, and weight control.  You are otherwise up to date with prevention measures today.  Please keep your appointments with your specialists as you may have planned  Please go to the LAB at the blood drawing area for the tests to be done at the [facility] lab today  You will be contacted by phone if any changes need to be made immediately.  Otherwise, you will receive a letter about your results with an explanation, but please check with MyChart first.  Please remember to sign up for MyChart if you have not done so, as this will be important to you in the future with finding out test results, communicating by private email, and scheduling acute appointments online when needed.  Please make an Appointment to return for your 1 year visit, or sooner if needed

## 2019-12-01 ENCOUNTER — Other Ambulatory Visit: Payer: Self-pay | Admitting: Internal Medicine

## 2019-12-01 LAB — URINALYSIS, ROUTINE W REFLEX MICROSCOPIC
Bilirubin Urine: NEGATIVE
Hgb urine dipstick: NEGATIVE
Ketones, ur: NEGATIVE
Leukocytes,Ua: NEGATIVE
Nitrite: NEGATIVE
RBC / HPF: NONE SEEN (ref 0–?)
Specific Gravity, Urine: 1.015 (ref 1.000–1.030)
Total Protein, Urine: NEGATIVE
Urine Glucose: NEGATIVE
Urobilinogen, UA: 0.2 (ref 0.0–1.0)
WBC, UA: NONE SEEN (ref 0–?)
pH: 6.5 (ref 5.0–8.0)

## 2019-12-01 LAB — TSH: TSH: 0.31 u[IU]/mL — ABNORMAL LOW (ref 0.35–4.50)

## 2019-12-01 LAB — VITAMIN B12: Vitamin B-12: 226 pg/mL (ref 211–911)

## 2019-12-01 LAB — VITAMIN D 25 HYDROXY (VIT D DEFICIENCY, FRACTURES): VITD: 14.83 ng/mL — ABNORMAL LOW (ref 30.00–100.00)

## 2019-12-01 MED ORDER — VITAMIN D (ERGOCALCIFEROL) 1.25 MG (50000 UNIT) PO CAPS: 50000.0000 [IU] | ORAL_CAPSULE | ORAL | 0 refills | Status: DC

## 2019-12-23 ENCOUNTER — Telehealth: Payer: Self-pay

## 2019-12-23 NOTE — Telephone Encounter (Signed)
New message    Pt c/o medication issue:  1. Name of Medication: levothyroxine (SYNTHROID) 112 MCG tablet  2. How are you currently taking this medication (dosage and times per day)? Once in the am   3. Are you having a reaction (difficulty breathing--STAT)? No   4. What is your medication issue? Discuss at visit  4.19.2021 for a 90 days supply verse  30 days

## 2019-12-24 ENCOUNTER — Other Ambulatory Visit: Payer: Self-pay

## 2019-12-24 MED ORDER — LEVOTHYROXINE SODIUM 112 MCG PO TABS: 112.0000 ug | ORAL_TABLET | Freq: Every day | ORAL | 0 refills | Status: DC

## 2019-12-24 NOTE — Telephone Encounter (Signed)
Script faxed in today. 

## 2019-12-28 ENCOUNTER — Encounter: Payer: Self-pay | Admitting: Family

## 2019-12-28 ENCOUNTER — Other Ambulatory Visit: Payer: Self-pay

## 2019-12-28 ENCOUNTER — Encounter: Payer: Self-pay | Admitting: Internal Medicine

## 2019-12-28 ENCOUNTER — Ambulatory Visit: Payer: BC Managed Care – PPO | Admitting: Family

## 2019-12-28 VITALS — BP 120/72 | HR 62 | Temp 98.2°F | Ht 68.0 in | Wt 165.2 lb

## 2019-12-28 DIAGNOSIS — R002 Palpitations: Secondary | ICD-10-CM

## 2019-12-28 DIAGNOSIS — E039 Hypothyroidism, unspecified: Secondary | ICD-10-CM | POA: Diagnosis not present

## 2019-12-28 DIAGNOSIS — E538 Deficiency of other specified B group vitamins: Secondary | ICD-10-CM

## 2019-12-28 NOTE — Progress Notes (Signed)
[name] is a 49 y.o. female with the following history as recorded in [employer]:  Patient Active Problem List   Diagnosis Date Noted  . Dysuria 06/25/2019  . Acute sinus infection 11/04/2018  . Menopausal symptoms 07/09/2018  . HLD (hyperlipidemia) 07/09/2018  . Vitamin D deficiency 07/09/2018  . Allergic rhinitis 07/09/2018  . Hyperglycemia 05/31/2017  . Daytime sleepiness 05/31/2017  . Lower abdominal pain 05/31/2017  . Chronic venous insufficiency 03/01/2016  . HNP (herniated nucleus pulposus), lumbar 01/13/2016  . Spinal stenosis of lumbar region 01/13/2016  . Right lumbar radiculopathy 01/05/2016  . RLQ abdominal pain 03/03/2015  . Acute medial meniscal tear 09/22/2014  . Triceps tendinitis 09/22/2014  . Left medial knee pain 09/14/2014  . Left elbow pain 09/14/2014  . Diarrhea 04/04/2014  . Lower back pain 01/14/2013  . Irregular menses 09/15/2012  . Bilateral bunions 10/12/2011  . Right ankle pain 10/12/2011  . Umbilical pain [id number]  . Preventative health care 04/26/2011  . HYPERTHYROIDISM 05/02/2010  . Hypothyroidism 05/02/2010  . Depression 05/02/2010  . INSOMNIA 05/02/2010  . TRANSAMINASES, SERUM, ELEVATED 05/02/2010    Current Outpatient Medications  Medication Sig Dispense Refill  . estradiol (ESTRACE) 0.1 MG/GM vaginal cream PLACE 1 GRAM IN VAGINA EVERY OTHER DAY FOR 7 DAYS THEN TWICE A WEEK THEREAFTER    . levothyroxine (SYNTHROID) 112 MCG tablet Take 1 tablet (112 mcg total) by mouth daily before breakfast. Annual appt is due w/.labs must see provider for future refills 30 tablet 0  . Vitamin D, Ergocalciferol, (DRISDOL) 1.25 MG (50000 UNIT) CAPS capsule Take 1 capsule (50,000 Units total) by mouth every 7 (seven) days. 12 capsule 0   No current facility-administered medications for this visit.    Allergies: Sulfa antibiotics  Past Medical History:  Diagnosis Date  . Abdominal pain, epigastric 06/19/2010  . Allergic rhinitis 07/09/2018  .  Allergy   . Cancer [facility])    thyroid cancer   . DEPRESSION 05/02/2010  . HLD (hyperlipidemia) 07/09/2018  . HYPERTHYROIDISM 05/02/2010  . HYPOTHYROIDISM 05/02/2010  . INSOMNIA 05/02/2010  . NAUSEA 06/19/2010  . TRANSAMINASES, SERUM, ELEVATED 05/02/2010    Past Surgical History:  Procedure Laterality Date  . APPENDECTOMY    . CESAREAN SECTION     x 2  . CHOLECYSTECTOMY  2011  . LUMBAR LAMINECTOMY/DECOMPRESSION MICRODISCECTOMY N/A 01/13/2016   Procedure: MICRO LUMBAR DECOMPRESSION L5-S1, L4-L5;  Surgeon: Susa Day, MD;  Location: [facility];  Service: Orthopedics;  Laterality: N/A;  . s/p compound fx left femur s/p rod, now removed    . TUBAL LIGATION      Family History  Problem Relation Age of Onset  . Stroke Mother   . Depression Mother   . Alcohol abuse Mother   . Alcohol abuse Father   . Heart disease Father   . Hyperlipidemia Father   . Hypertension Father   . Diabetes Father   . Heart disease Other   . Hypertension Other   . Hyperlipidemia Other   . Stroke Other   . Diabetes Other   . COPD Other   . COPD Other   . Cancer Paternal Uncle        mets all over   . Breast cancer Unknown        M. Great grandmother    Social History   Tobacco Use  . Smoking status: Never Smoker  . Smokeless tobacco: Never Used  Substance Use Topics  . Alcohol use: Yes    Alcohol/week: 0.0 standard  drinks    Comment: Occ- wine     Subjective:  2 days ago patient had episode of numbness in left hand/ palpitations and noted that she felt weak/ tired; notes that entire episode lasted about 30 minutes; was concerned about her heart and is requesting an EKG today; did not go to U/C or ER with these symptoms; Had CPE last month- B12 at low end of normal; TSH indicated possible hyperthyroidism;   Objective:  Vitals:   12/28/19 1427  BP: 120/72  Pulse: 62  Temp: 98.2 F (36.8 C)  TempSrc: Oral  SpO2: 98%  Weight: 165 lb 3.2 oz (74.9 kg)  Height: 5\' 8"  (1.727 m)    General: Well  developed, well nourished, in no acute distress  Skin : Warm and dry.  Head: Normocephalic and atraumatic  Eyes: Sclera and conjunctiva clear; pupils round and reactive to light; extraocular movements intact  Ears: External normal; canals clear; tympanic membranes normal  Oropharynx: Pink, supple. No suspicious lesions  Neck: Supple without thyromegaly, adenopathy  Lungs: Respirations unlabored; clear to auscultation bilaterally without wheeze, rales, rhonchi  CVS exam: normal rate and regular rhythm.  Musculoskeletal: No deformities; no active joint inflammation  Extremities: No edema, cyanosis, clubbing  Vessels: Symmetric bilaterally  Neurologic: Alert and oriented; speech intact; face symmetrical; moves all extremities well; CNII-XII intact without focal deficit  Assessment:  1. Hypothyroidism, unspecified type   2. Low vitamin B12 level   3. Palpitations     Plan:  Update EKG today- NSR; reassurance that very low suspicion for cardiac source of her symptoms that occurred on Saturday; will repeat thyroid panel with TSH- ? If Synthroid dosage needs to be lowered to 100 mcg; patient may also benefit from taking daily B12 supplement; follow-up to be determined;   This visit occurred during the SARS-CoV-2 public health emergency.  Safety protocols were in place, including screening questions prior to the visit, additional usage of staff PPE, and extensive cleaning of exam room while observing appropriate contact time as indicated for disinfecting solutions.     No follow-ups on file.  Orders Placed This Encounter  Procedures  . Thyroid Panel With TSH  . B12    Requested Prescriptions    No prescriptions requested or ordered in this encounter

## 2019-12-29 LAB — THYROID PANEL WITH TSH
Free Thyroxine Index: 3 (ref 1.4–3.8)
T3 Uptake: 33 % (ref 22–35)
T4, Total: 9.2 ug/dL (ref 5.1–11.9)
TSH: 0.38 mIU/L — ABNORMAL LOW

## 2019-12-29 LAB — VITAMIN B12: Vitamin B-12: 196 pg/mL — ABNORMAL LOW (ref 211–911)

## 2019-12-30 ENCOUNTER — Other Ambulatory Visit: Payer: Self-pay | Admitting: Family

## 2019-12-30 MED ORDER — LEVOTHYROXINE SODIUM 100 MCG PO TABS: 100.0000 ug | ORAL_TABLET | Freq: Every day | ORAL | 1 refills | Status: DC

## 2020-01-01 ENCOUNTER — Ambulatory Visit (INDEPENDENT_AMBULATORY_CARE_PROVIDER_SITE_OTHER): Payer: BC Managed Care – PPO | Admitting: *Deleted

## 2020-01-01 ENCOUNTER — Other Ambulatory Visit: Payer: Self-pay

## 2020-01-01 ENCOUNTER — Other Ambulatory Visit: Payer: Self-pay | Admitting: Family

## 2020-01-01 DIAGNOSIS — E039 Hypothyroidism, unspecified: Secondary | ICD-10-CM

## 2020-01-01 DIAGNOSIS — E538 Deficiency of other specified B group vitamins: Secondary | ICD-10-CM | POA: Diagnosis not present

## 2020-01-01 MED ORDER — CYANOCOBALAMIN 1000 MCG/ML IJ SOLN
1000.0000 ug | Freq: Once | INTRAMUSCULAR | Status: AC
  Administered 2020-01-01: 1000 ug via INTRAMUSCULAR

## 2020-01-01 NOTE — Progress Notes (Signed)
Pls cosign for B12 inj../lmb  

## 2020-01-08 ENCOUNTER — Ambulatory Visit (INDEPENDENT_AMBULATORY_CARE_PROVIDER_SITE_OTHER): Payer: BC Managed Care – PPO | Admitting: *Deleted

## 2020-01-08 ENCOUNTER — Other Ambulatory Visit: Payer: Self-pay

## 2020-01-08 DIAGNOSIS — E538 Deficiency of other specified B group vitamins: Secondary | ICD-10-CM | POA: Diagnosis not present

## 2020-01-08 MED ORDER — CYANOCOBALAMIN 1000 MCG/ML IJ SOLN
1000.0000 ug | Freq: Once | INTRAMUSCULAR | Status: AC
  Administered 2020-01-08: 1000 ug via INTRAMUSCULAR

## 2020-01-08 NOTE — Progress Notes (Signed)
Pls cosign for B12 inj../lmb  

## 2020-01-15 ENCOUNTER — Other Ambulatory Visit: Payer: Self-pay

## 2020-01-15 ENCOUNTER — Ambulatory Visit (INDEPENDENT_AMBULATORY_CARE_PROVIDER_SITE_OTHER): Payer: BC Managed Care – PPO | Admitting: *Deleted

## 2020-01-15 DIAGNOSIS — E538 Deficiency of other specified B group vitamins: Secondary | ICD-10-CM

## 2020-01-15 DIAGNOSIS — Z1231 Encounter for screening mammogram for malignant neoplasm of breast: Secondary | ICD-10-CM | POA: Diagnosis not present

## 2020-01-15 MED ORDER — CYANOCOBALAMIN 1000 MCG/ML IJ SOLN
1000.0000 ug | Freq: Once | INTRAMUSCULAR | Status: AC
  Administered 2020-01-15: 1000 ug via INTRAMUSCULAR

## 2020-01-15 NOTE — Progress Notes (Signed)
Pls cosign for B12 inj../lmb  

## 2020-01-18 DIAGNOSIS — Z01419 Encounter for gynecological examination (general) (routine) without abnormal findings: Secondary | ICD-10-CM | POA: Diagnosis not present

## 2020-01-18 DIAGNOSIS — Z6824 Body mass index (BMI) 24.0-24.9, adult: Secondary | ICD-10-CM | POA: Diagnosis not present

## 2020-01-18 DIAGNOSIS — N952 Postmenopausal atrophic vaginitis: Secondary | ICD-10-CM | POA: Diagnosis not present

## 2020-01-22 ENCOUNTER — Ambulatory Visit: Payer: BC Managed Care – PPO

## 2020-01-27 ENCOUNTER — Ambulatory Visit: Payer: BC Managed Care – PPO | Admitting: Internal Medicine

## 2020-01-27 DIAGNOSIS — Z0289 Encounter for other administrative examinations: Secondary | ICD-10-CM

## 2020-02-04 ENCOUNTER — Other Ambulatory Visit: Payer: Self-pay | Admitting: Internal Medicine

## 2020-02-04 NOTE — Telephone Encounter (Signed)
Please refill as per office routine med refill policy (all routine meds refilled for 3 mo or monthly per pt preference up to one year from last visit, then month to month grace period for 3 mo, then further med refills will have to be denied)  

## 2020-02-27 ENCOUNTER — Other Ambulatory Visit: Payer: Self-pay

## 2020-02-27 ENCOUNTER — Encounter (HOSPITAL_COMMUNITY): Payer: Self-pay | Admitting: Emergency Medicine

## 2020-02-27 ENCOUNTER — Emergency Department (HOSPITAL_COMMUNITY): Payer: Medicaid Other

## 2020-02-27 ENCOUNTER — Other Ambulatory Visit: Payer: Self-pay | Admitting: Internal Medicine

## 2020-02-27 ENCOUNTER — Emergency Department (HOSPITAL_COMMUNITY)
Admission: EM | Admit: 2020-02-27 | Discharge: 2020-02-28 | Disposition: A | Discharge status: Home / Self Care | Payer: Medicaid Other | Attending: Emergency Medicine | Admitting: Emergency Medicine

## 2020-02-27 DIAGNOSIS — R42 Dizziness and giddiness: Secondary | ICD-10-CM

## 2020-02-27 DIAGNOSIS — J3489 Other specified disorders of nose and nasal sinuses: Secondary | ICD-10-CM | POA: Diagnosis not present

## 2020-02-27 DIAGNOSIS — R110 Nausea: Secondary | ICD-10-CM | POA: Diagnosis not present

## 2020-02-27 DIAGNOSIS — R531 Weakness: Secondary | ICD-10-CM | POA: Diagnosis not present

## 2020-02-27 DIAGNOSIS — R0602 Shortness of breath: Secondary | ICD-10-CM | POA: Insufficient documentation

## 2020-02-27 DIAGNOSIS — Z79890 Hormone replacement therapy (postmenopausal): Secondary | ICD-10-CM | POA: Insufficient documentation

## 2020-02-27 DIAGNOSIS — Z85850 Personal history of malignant neoplasm of thyroid: Secondary | ICD-10-CM | POA: Diagnosis not present

## 2020-02-27 DIAGNOSIS — R079 Chest pain, unspecified: Secondary | ICD-10-CM | POA: Diagnosis not present

## 2020-02-27 DIAGNOSIS — R202 Paresthesia of skin: Secondary | ICD-10-CM | POA: Insufficient documentation

## 2020-02-27 DIAGNOSIS — R479 Unspecified speech disturbances: Secondary | ICD-10-CM | POA: Diagnosis not present

## 2020-02-27 DIAGNOSIS — R002 Palpitations: Secondary | ICD-10-CM | POA: Diagnosis not present

## 2020-02-27 DIAGNOSIS — E039 Hypothyroidism, unspecified: Secondary | ICD-10-CM | POA: Diagnosis not present

## 2020-02-27 DIAGNOSIS — R519 Headache, unspecified: Secondary | ICD-10-CM | POA: Insufficient documentation

## 2020-02-27 DIAGNOSIS — R600 Localized edema: Secondary | ICD-10-CM | POA: Insufficient documentation

## 2020-02-27 DIAGNOSIS — N3289 Other specified disorders of bladder: Secondary | ICD-10-CM | POA: Diagnosis not present

## 2020-02-27 DIAGNOSIS — H538 Other visual disturbances: Secondary | ICD-10-CM | POA: Diagnosis not present

## 2020-02-27 DIAGNOSIS — R0789 Other chest pain: Secondary | ICD-10-CM

## 2020-02-27 LAB — CBC
HCT: 41.3 % (ref 36.0–46.0)
Hemoglobin: 13.8 g/dL (ref 12.0–15.0)
MCH: 32.1 pg (ref 26.0–34.0)
MCHC: 33.4 g/dL (ref 30.0–36.0)
MCV: 96 fL (ref 80.0–100.0)
Platelets: 236 10*3/uL (ref 150–400)
RBC: 4.3 MIL/uL (ref 3.87–5.11)
RDW: 13.3 % (ref 11.5–15.5)
WBC: 5.6 10*3/uL (ref 4.0–10.5)
nRBC: 0 % (ref 0.0–0.2)

## 2020-02-27 LAB — BASIC METABOLIC PANEL
Anion gap: 7 (ref 5–15)
BUN: 12 mg/dL (ref 6–20)
CO2: 30 mmol/L (ref 22–32)
Calcium: 9.7 mg/dL (ref 8.9–10.3)
Chloride: 104 mmol/L (ref 98–111)
Creatinine, Ser: 0.68 mg/dL (ref 0.44–1.00)
GFR calc Af Amer: >60 mL/min (ref >60)
GFR calc non Af Amer: >60 mL/min (ref >60)
Glucose, Bld: 107 mg/dL — ABNORMAL HIGH (ref 70–99)
Potassium: 3.6 mmol/L (ref 3.5–5.1)
Sodium: 141 mmol/L (ref 135–145)

## 2020-02-27 LAB — TROPONIN I (HIGH SENSITIVITY)
Troponin I (High Sensitivity): 3 ng/L (ref <18)
Troponin I (High Sensitivity): 3 ng/L (ref <18)

## 2020-02-27 MED ORDER — IOHEXOL 300 MG/ML  SOLN: 100.0000 mL | Freq: Once | INTRAMUSCULAR | Status: DC | PRN

## 2020-02-27 MED ORDER — MECLIZINE HCL 25 MG PO TABS
12.5000 mg | ORAL_TABLET | Freq: Once | ORAL | Status: AC
  Administered 2020-02-27: 12.5 mg via ORAL
  Filled 2020-02-27: qty 1

## 2020-02-27 MED ORDER — SODIUM CHLORIDE (PF) 0.9 % IJ SOLN
INTRAMUSCULAR | Status: AC
  Filled 2020-02-27: qty 50

## 2020-02-27 MED ORDER — SODIUM CHLORIDE 0.9 % IV BOLUS
1000.0000 mL | Freq: Once | INTRAVENOUS | Status: AC
  Administered 2020-02-27: 1000 mL via INTRAVENOUS

## 2020-02-27 MED ORDER — ONDANSETRON HCL 4 MG/2ML IJ SOLN
4.0000 mg | Freq: Once | INTRAMUSCULAR | Status: AC
  Administered 2020-02-27: 4 mg via INTRAVENOUS
  Filled 2020-02-27: qty 2

## 2020-02-27 MED ORDER — SODIUM CHLORIDE 0.9% FLUSH: 3.0000 mL | Freq: Once | INTRAVENOUS | Status: DC

## 2020-02-27 NOTE — Discharge Instructions (Signed)
You were given an ambulatory referral to cardiology.  They will reach out to you to schedule an appointment for follow-up in regards to the chest pain he had today.  With regards to your dizziness, you will be given a prescription for meclizine to help with your symptoms. You will need to follow-up with your regular doctor in regards to this.  If you have any new or worsening symptoms in the meantime you will need to return to the emergency department immediately.

## 2020-02-27 NOTE — ED Provider Notes (Signed)
[place] DEPT Provider Note   CSN: [id number] Arrival date & time: 02/27/20  2007     History Chief Complaint  Patient presents with  . Chest Pain    [name] is a 49 y.o. female.  HPI   Pt is a 49 y/o female with a h/o thyroid CA s/p radiation thereapy, depression, who presents to the ED today for eval of chest pain and neurologic complaints.  Neurologic complaints: Patient states her last known well was when she went to bed 2 days ago.  She woke up yesterday morning around 7 AM and noticed that her vision was blurred to both of her eyes and she was having difficulty finding her words.  She also feels like her speech is somewhat slurred.  States she feels dizzy like the room is spinning and has been very fatigued.  She also has a frontal headache and decreased sensation to the left side of her body.  She feels like her left leg is somewhat weak.  She tried taking some Dramamine and Zyrtec without resolution of her symptoms.  Chest discomfort: States that for the last week she has had intermittent left arm pain.  Symptoms became worse when she woke up this morning and the pain radiated up to her neck.  She also reports that she had some left-sided chest pain described as a "pounding in my heart ".  This was associated with nausea some diaphoresis and some shortness of breath.  Rates discomfort at 4/10.  States that symptoms occur randomly and are not associated with any specific aggravating factors.  At this time she states that her chest symptoms are minimal and that her chest feels fine.  Past Medical History:  Diagnosis Date  . Abdominal pain, epigastric 06/19/2010  . Allergic rhinitis 07/09/2018  . Allergy   . Cancer [facility])    thyroid cancer   . DEPRESSION 05/02/2010  . HLD (hyperlipidemia) 07/09/2018  . HYPERTHYROIDISM 05/02/2010  . HYPOTHYROIDISM 05/02/2010  . INSOMNIA 05/02/2010  . NAUSEA 06/19/2010  . TRANSAMINASES, SERUM, ELEVATED  05/02/2010    Patient Active Problem List   Diagnosis Date Noted  . Dysuria 06/25/2019  . Acute sinus infection 11/04/2018  . Menopausal symptoms 07/09/2018  . HLD (hyperlipidemia) 07/09/2018  . Vitamin D deficiency 07/09/2018  . Allergic rhinitis 07/09/2018  . Hyperglycemia 05/31/2017  . Daytime sleepiness 05/31/2017  . Lower abdominal pain 05/31/2017  . Chronic venous insufficiency 03/01/2016  . HNP (herniated nucleus pulposus), lumbar 01/13/2016  . Spinal stenosis of lumbar region 01/13/2016  . Right lumbar radiculopathy 01/05/2016  . RLQ abdominal pain 03/03/2015  . Acute medial meniscal tear 09/22/2014  . Triceps tendinitis 09/22/2014  . Left medial knee pain 09/14/2014  . Left elbow pain 09/14/2014  . Diarrhea 04/04/2014  . Lower back pain 01/14/2013  . Irregular menses 09/15/2012  . Bilateral bunions 10/12/2011  . Right ankle pain 10/12/2011  . Umbilical pain 42/59/5638  . Preventative health care 04/26/2011  . HYPERTHYROIDISM 05/02/2010  . Hypothyroidism 05/02/2010  . Depression 05/02/2010  . INSOMNIA 05/02/2010  . TRANSAMINASES, SERUM, ELEVATED 05/02/2010    Past Surgical History:  Procedure Laterality Date  . APPENDECTOMY    . CESAREAN SECTION     x 2  . CHOLECYSTECTOMY  2011  . LUMBAR LAMINECTOMY/DECOMPRESSION MICRODISCECTOMY N/A 01/13/2016   Procedure: MICRO LUMBAR DECOMPRESSION L5-S1, L4-L5;  Surgeon: Susa Day, MD;  Location: [facility] ORS;  Service: Orthopedics;  Laterality: N/A;  . s/p compound fx left femur s/p  rod, now removed    . TUBAL LIGATION       OB History   No obstetric history on file.     Family History  Problem Relation Age of Onset  . Stroke Mother   . Depression Mother   . Alcohol abuse Mother   . Alcohol abuse Father   . Heart disease Father   . Hyperlipidemia Father   . Hypertension Father   . Diabetes Father   . Heart disease Other   . Hypertension Other   . Hyperlipidemia Other   . Stroke Other   . Diabetes Other   .  COPD Other   . COPD Other   . Cancer Paternal Uncle        mets all over   . Breast cancer Unknown        M. Great grandmother    Social History   Tobacco Use  . Smoking status: Never Smoker  . Smokeless tobacco: Never Used  Substance Use Topics  . Alcohol use: Yes    Alcohol/week: 0.0 standard drinks    Comment: Occ- wine   . Drug use: No    Home Medications Prior to Admission medications   Medication Sig Start Date End Date Taking? Authorizing Provider  estradiol (ESTRACE) 0.1 MG/GM vaginal cream PLACE 1 GRAM IN VAGINA EVERY OTHER DAY FOR 7 DAYS THEN TWICE A WEEK THEREAFTER 09/23/19   [provider]  EUTHYROX 112 MCG tablet TAKE 1 TABLET BY MOUTH ONCE DAILY BEFORE BREAKFAST . APPOINTMENT REQUIRED FOR FUTURE REFILLS 02/04/20   Biagio Borg, MD  levothyroxine (SYNTHROID) 100 MCG tablet Take 1 tablet (100 mcg total) by mouth daily before breakfast. Annual appt is due w/.labs must see provider for future refills 12/30/19   Marrian Salvage, FNP  Vitamin D, Ergocalciferol, (DRISDOL) 1.25 MG (50000 UNIT) CAPS capsule Take 1 capsule (50,000 Units total) by mouth every 7 (seven) days. 12/01/19   Biagio Borg, MD    Allergies    Sulfa antibiotics  Review of Systems   Review of Systems  Constitutional: Negative for fever.  HENT: Negative for ear pain and sore throat.   Eyes: Negative for visual disturbance.  Respiratory: Positive for shortness of breath. Negative for cough.   Cardiovascular: Positive for chest pain, palpitations and leg swelling (chronic RLE swelling (for years, at baseline)).  Gastrointestinal: Positive for nausea. Negative for constipation, diarrhea and vomiting.  Genitourinary: Negative for dysuria and hematuria.  Musculoskeletal: Negative for back pain.       Left arm pain  Skin: Negative for rash.  Neurological: Positive for dizziness, speech difficulty, weakness, numbness and headaches.  All other systems reviewed and are  negative.   Physical Exam Updated Vital Signs BP 106/67 (BP Location: Left Arm)   Pulse (!) 57   Temp 97.9 F (36.6 C) (Oral)   Resp 15   Ht 5\' 7"  (1.702 m)   Wt 72.6 kg   LMP 01/02/2016   SpO2 98%   BMI 25.06 kg/m   Physical Exam Vitals and nursing note reviewed.  Constitutional:      General: She is not in acute distress.    Appearance: She is well-developed.  HENT:     Head: Normocephalic and atraumatic.  Eyes:     Extraocular Movements: Extraocular movements intact.     Conjunctiva/sclera: Conjunctivae normal.     Pupils: Pupils are equal, round, and reactive to light.  Cardiovascular:     Rate and Rhythm: Normal rate and  regular rhythm.     Heart sounds: No murmur heard.   Pulmonary:     Effort: Pulmonary effort is normal. No respiratory distress.     Breath sounds: Normal breath sounds. No decreased breath sounds, wheezing, rhonchi or rales.  Abdominal:     Palpations: Abdomen is soft.     Tenderness: There is no abdominal tenderness. There is no guarding or rebound.  Musculoskeletal:     Cervical back: Neck supple.  Skin:    General: Skin is warm and dry.  Neurological:     Mental Status: She is alert.     Comments: Mental Status:  Alert, thought content appropriate, able to give a coherent history. Speech fluent without evidence of aphasia. Able to follow 2 step commands without difficulty.  Cranial Nerves:  II: pupils equal, round, reactive to light III,IV, VI: ptosis not present, extra-ocular motions intact bilaterally  V,VII: smile symmetric, decreased sensation to the left side of the face VIII: hearing grossly normal to voice  X: uvula elevates symmetrically  XI: bilateral shoulder shrug symmetric and strong XII: midline tongue extension without fassiculations Motor:  Normal tone. 5/5 strength of BUE and BLE major muscle groups including strong and equal grip strength and dorsiflexion/plantar flexion Sensory: decreased sensation to the LUE,  LLE Cerebellar: normal finger-to-nose with bilateral upper extremities, normal heel to shin Neg pronator drift      ED Results / Procedures / Treatments   Labs (all labs ordered are listed, but only abnormal results are displayed) Labs Reviewed  BASIC METABOLIC PANEL - Abnormal; Notable for the following components:      Result Value   Glucose, Bld 107 (*)    All other components within normal limits  CBC  TROPONIN I (HIGH SENSITIVITY)  TROPONIN I (HIGH SENSITIVITY)    EKG EKG Interpretation  Date/Time:  Saturday February 27 2020 20:16:06 EDT Ventricular Rate:  72 PR Interval:    QRS Duration: 90 QT Interval:  398 QTC Calculation: 436 R Axis:   76 Text Interpretation: Sinus rhythm 12 Lead; Mason-Likar No significant change since last tracing Confirmed by Gareth Morgan [phone]) on 02/27/2020 9:14:48 PM   Radiology DG Chest 2 View  Result Date: 02/27/2020 CLINICAL DATA:  Chest pain. EXAM: CHEST - 2 VIEW COMPARISON:  None. FINDINGS: The heart size and mediastinal contours are within normal limits. Both lungs are clear. The visualized skeletal structures are unremarkable. IMPRESSION: No active cardiopulmonary disease. Electronically Signed   By: Virgina Norfolk M.D.   On: 02/27/2020 20:37   MR BRAIN WO CONTRAST  Result Date: 02/27/2020 CLINICAL DATA:  Word finding difficulty and blurry vision EXAM: MRI HEAD WITHOUT CONTRAST TECHNIQUE: Multiplanar, multiecho pulse sequences of the brain and surrounding structures were obtained without intravenous contrast. COMPARISON:  None. FINDINGS: Brain: There is no acute infarction or intracranial hemorrhage. There is no intracranial mass, mass effect, or edema. There is no hydrocephalus or extra-axial fluid collection. Ventricles and sulci are normal in size and configuration. Vascular: Major vessel flow voids at the skull base are preserved. Skull and upper cervical spine: Normal marrow signal is preserved. Sinuses/Orbits: Trace mucosal  thickening.  Orbits are unremarkable. Other: Sella is unremarkable.  Mastoid air cells are clear. IMPRESSION: No evidence of recent infarction, hemorrhage, or mass. Electronically Signed   By: Macy Mis M.D.   On: 02/27/2020 22:19    Procedures Procedures (including critical care time)  Medications Ordered in ED Medications  meclizine (ANTIVERT) tablet 12.5 mg (has no administration in time  range)  sodium chloride 0.9 % bolus 1,000 mL (1,000 mLs Intravenous New Bag/Given 02/27/20 2327)  ondansetron (ZOFRAN) injection 4 mg (4 mg Intravenous Given 02/27/20 2331)    ED Course  I have reviewed the triage vital signs and the nursing notes.  Pertinent labs & imaging results that were available during my care of the patient were reviewed by me and considered in my medical decision making (see chart for details).    MDM Rules/Calculators/A&P                          49 year old female presenting for evaluation of chest pain.  Also reporting some blurred vision, aphasia and left-sided sensory changes.  Reviewed/interpreted labs CBC without leukocytosis or anemia BMP with normal electrolytes and kidney Initial troponin negative, delta troponin neg  EKG with normal sinus rhythm, no significant change in last tracing, no acute ischemic changes  Chest x-ray reviewed/interpreted, no pneumothorax, pneumonia, widened mediastinum or other acute abnormality  MRI brain obtained to rule out stroke or other emergent cause of symptoms and was negative for any acute abnormalities.  CTA dissection chest/abdomen/pelvis pending at shift change. Care transitioned to Lorre Munroe, PA-C with plan to f/u on imaging. If neg, pt likely safe for d/c home with cardiology and PCP f/u.   Lower likelihood for ACS as neg trop x2 and heart score 4. Risks/benefits of admission for cp r/o discussed with pt by Dr. Billy Fischer and pt would like to be d/c with cards f/u in regards to this chest pain if CTA is  neg.  Final Clinical Impression(s) / ED Diagnoses Final diagnoses:  Atypical chest pain  Dizziness    Rx / DC Orders ED Discharge Orders         Ordered    Ambulatory referral to Cardiology     Discontinue  Reprint     02/27/20 2335           Rodney Booze, PA-C 02/27/20 2336    Gareth Morgan, MD 02/29/20 0140

## 2020-02-27 NOTE — Telephone Encounter (Signed)
Please refill as per office routine med refill policy (all routine meds refilled for 3 mo or monthly per pt preference up to one year from last visit, then month to month grace period for 3 mo, then further med refills will have to be denied)  

## 2020-02-27 NOTE — ED Notes (Signed)
Patient is feeling nauseas.

## 2020-02-27 NOTE — ED Triage Notes (Signed)
Pt reports having chest pain that started today in substernal chest. Pt also reports difficulty finding words and blurred vision that stared yesterday. Pt reports pain that goes from left arm to neck.

## 2020-02-28 DIAGNOSIS — N3289 Other specified disorders of bladder: Secondary | ICD-10-CM | POA: Diagnosis not present

## 2020-02-28 DIAGNOSIS — R079 Chest pain, unspecified: Secondary | ICD-10-CM | POA: Diagnosis not present

## 2020-02-28 MED ORDER — IOHEXOL 350 MG/ML SOLN
100.0000 mL | Freq: Once | INTRAVENOUS | Status: AC | PRN
  Administered 2020-02-28: 100 mL via INTRAVENOUS

## 2020-02-28 NOTE — ED Provider Notes (Signed)
Patient signed out to me at shift change pending dissection study.  Plan is to follow-up on CT and plan for discharge if negative.  CT shows no evidence of acute aortic syndrome or acute intrathoracic or intra-abdominal process.  Patient reassured.  She was offered admission for observation, but states that she would rather follow-up with her doctor.  She has had negative troponins x2.  I feel that patient is stable and appropriate for discharge after fairly extensive work-up today.  Return precautions given.   Montine Circle, PA-C 02/28/20 Callahan, Delice Bison, DO 02/28/20 0120

## 2020-02-29 ENCOUNTER — Encounter: Payer: Self-pay | Admitting: Internal Medicine

## 2020-02-29 ENCOUNTER — Other Ambulatory Visit: Payer: Self-pay

## 2020-02-29 ENCOUNTER — Ambulatory Visit (INDEPENDENT_AMBULATORY_CARE_PROVIDER_SITE_OTHER): Payer: Self-pay | Admitting: Internal Medicine

## 2020-02-29 VITALS — BP 130/60 | HR 67 | Temp 98.4°F | Ht 67.0 in | Wt 159.0 lb

## 2020-02-29 DIAGNOSIS — J301 Allergic rhinitis due to pollen: Secondary | ICD-10-CM | POA: Insufficient documentation

## 2020-02-29 DIAGNOSIS — H6983 Other specified disorders of Eustachian tube, bilateral: Secondary | ICD-10-CM

## 2020-02-29 DIAGNOSIS — H6993 Unspecified Eustachian tube disorder, bilateral: Secondary | ICD-10-CM | POA: Insufficient documentation

## 2020-02-29 MED ORDER — METHYLPREDNISOLONE ACETATE 80 MG/ML IJ SUSP
120.0000 mg | Freq: Once | INTRAMUSCULAR | Status: AC
Start: 2020-02-29 — End: 2020-02-29
  Administered 2020-02-29: 120 mg via INTRAMUSCULAR

## 2020-02-29 MED ORDER — LEVOCETIRIZINE DIHYDROCHLORIDE 5 MG PO TABS: 5.0000 mg | ORAL_TABLET | Freq: Every evening | ORAL | 1 refills | Status: DC

## 2020-02-29 NOTE — Patient Instructions (Signed)
Eustachian Tube Dysfunction ° °Eustachian tube dysfunction refers to a condition in which a blockage develops in the narrow passage that connects the middle ear to the back of the nose (eustachian tube). The eustachian tube regulates air pressure in the middle ear by letting air move between the ear and nose. It also helps to drain fluid from the middle ear space. °Eustachian tube dysfunction can affect one or both ears. When the eustachian tube does not function properly, air pressure, fluid, or both can build up in the middle ear. °What are the causes? °This condition occurs when the eustachian tube becomes blocked or cannot open normally. Common causes of this condition include: °· Ear infections. °· Colds and other infections that affect the nose, mouth, and throat (upper respiratory tract). °· Allergies. °· Irritation from cigarette smoke. °· Irritation from stomach acid coming up into the esophagus (gastroesophageal reflux). The esophagus is the tube that carries food from the mouth to the stomach. °· Sudden changes in air pressure, such as from descending in an airplane or scuba diving. °· Abnormal growths in the nose or throat, such as: °? Growths that line the nose (nasal polyps). °? Abnormal growth of cells (tumors). °? Enlarged tissue at the back of the throat (adenoids). °What increases the risk? °You are more likely to develop this condition if: °· You smoke. °· You are overweight. °· You are a child who has: °? Certain birth defects of the mouth, such as cleft palate. °? Large tonsils or adenoids. °What are the signs or symptoms? °Common symptoms of this condition include: °· A feeling of fullness in the ear. °· Ear pain. °· Clicking or popping noises in the ear. °· Ringing in the ear. °· Hearing loss. °· Loss of balance. °· Dizziness. °Symptoms may get worse when the air pressure around you changes, such as when you travel to an area of high elevation, fly on an airplane, or go scuba diving. °How is  this diagnosed? °This condition may be diagnosed based on: °· Your symptoms. °· A physical exam of your ears, nose, and throat. °· Tests, such as those that measure: °? The movement of your eardrum (tympanogram). °? Your hearing (audiometry). °How is this treated? °Treatment depends on the cause and severity of your condition. °· In mild cases, you may relieve your symptoms by moving air into your ears. This is called "popping the ears." °· In more severe cases, or if you have symptoms of fluid in your ears, treatment may include: °? Medicines to relieve congestion (decongestants). °? Medicines that treat allergies (antihistamines). °? Nasal sprays or ear drops that contain medicines that reduce swelling (steroids). °? A procedure to drain the fluid in your eardrum (myringotomy). In this procedure, a small tube is placed in the eardrum to: °§ Drain the fluid. °§ Restore the air in the middle ear space. °? A procedure to insert a balloon device through the nose to inflate the opening of the eustachian tube (balloon dilation). °Follow these instructions at home: °Lifestyle °· Do not do any of the following until your health care provider approves: °? Travel to high altitudes. °? Fly in airplanes. °? Work in a pressurized cabin or room. °? Scuba dive. °· Do not use any products that contain nicotine or tobacco, such as cigarettes and e-cigarettes. If you need help quitting, ask your health care provider. °· Keep your ears dry. Wear fitted earplugs during showering and bathing. Dry your ears completely after. °General instructions °· Take over-the-counter   and prescription medicines only as told by your health care provider. °· Use techniques to help pop your ears as recommended by your health care provider. These may include: °? Chewing gum. °? Yawning. °? Frequent, forceful swallowing. °? Closing your mouth, holding your nose closed, and gently blowing as if you are trying to blow air out of your nose. °· Keep all  follow-up visits as told by your health care provider. This is important. °Contact a health care provider if: °· Your symptoms do not go away after treatment. °· Your symptoms come back after treatment. °· You are unable to pop your ears. °· You have: °? A fever. °? Pain in your ear. °? Pain in your head or neck. °? Fluid draining from your ear. °· Your hearing suddenly changes. °· You become very dizzy. °· You lose your balance. °Summary °· Eustachian tube dysfunction refers to a condition in which a blockage develops in the eustachian tube. °· It can be caused by ear infections, allergies, inhaled irritants, or abnormal growths in the nose or throat. °· Symptoms include ear pain, hearing loss, or ringing in the ears. °· Mild cases are treated with maneuvers to unblock the ears, such as yawning or ear popping. °· Severe cases are treated with medicines. Surgery may also be done (rare). °This information is not intended to replace advice given to you by your health care provider. Make sure you discuss any questions you have with your health care provider. °Document Revised: 11/19/2017 Document Reviewed: 11/19/2017 °Elsevier Patient Education © 2020 [employer] ° °

## 2020-02-29 NOTE — Progress Notes (Signed)
Subjective:  Patient ID: [name], female    DOB: November 06, 1970  Age: 49 y.o. MRN: [id number]  CC: Allergic Rhinitis   This visit occurred during the SARS-CoV-2 public health emergency.  Safety protocols were in place, including screening questions prior to the visit, additional usage of staff PPE, and extensive cleaning of exam room while observing appropriate contact time as indicated for disinfecting solutions.    HPI [name] presents for concerns about a 3-day history of facial discomfort, postnasal drip, dizziness, vertigo and a popping sensation in her ears.  She was seen in an ED 2 days prior to this visit and a chest x-ray, CT scan of the chest abdomen and pelvis and MRI of the brain were unremarkable.  Outpatient Medications Prior to Visit  Medication Sig Dispense Refill   Cholecalciferol (VITAMIN D) 10 MCG/ML LIQD Vitamin D     estradiol (ESTRACE) 0.1 MG/GM vaginal cream PLACE 1 GRAM IN VAGINA EVERY OTHER DAY FOR 7 DAYS THEN TWICE A WEEK THEREAFTER     EUTHYROX 112 MCG tablet TAKE 1 TABLET BY MOUTH ONCE DAILY BEFORE BREAKFAST . APPOINTMENT REQUIRED FOR FUTURE REFILLS 30 tablet 0   levothyroxine (SYNTHROID) 100 MCG tablet Take 1 tablet (100 mcg total) by mouth daily before breakfast. Annual appt is due w/.labs must see provider for future refills 30 tablet 1   Vitamin D, Ergocalciferol, (DRISDOL) 1.25 MG (50000 UNIT) CAPS capsule Take 1 capsule (50,000 Units total) by mouth every 7 (seven) days. 12 capsule 0   No facility-administered medications prior to visit.    ROS Review of Systems  Constitutional: Negative.  Negative for chills, diaphoresis, fatigue and fever.  HENT: Positive for congestion, ear pain, postnasal drip, rhinorrhea and sinus pressure. Negative for facial swelling, sinus pain, sneezing, sore throat, tinnitus and voice change.   Eyes: Negative.   Respiratory: Negative for cough, chest tightness, shortness of breath and wheezing.   Cardiovascular:  Negative for chest pain and leg swelling.  Gastrointestinal: Negative for abdominal pain, constipation, diarrhea, nausea and vomiting.  Endocrine: Negative.   Genitourinary: Negative.  Negative for difficulty urinating.  Musculoskeletal: Negative.  Negative for back pain and neck pain.  Skin: Negative.  Negative for rash.  Neurological: Positive for dizziness. Negative for weakness, light-headedness, numbness and headaches.  Hematological: Negative for adenopathy. Does not bruise/bleed easily.  Psychiatric/Behavioral: Negative.     Objective:  BP 130/60 (BP Location: Left Arm, Patient Position: Sitting, Cuff Size: Normal)    Pulse 67    Temp 98.4 F (36.9 C) (Oral)    Ht 5\' 7"  (1.702 m)    Wt 159 lb (72.1 kg)    LMP 01/02/2016    SpO2 98%    BMI 24.90 kg/m   BP Readings from Last 3 Encounters:  02/29/20 130/60  02/28/20 104/73  12/28/19 120/72    Wt Readings from Last 3 Encounters:  02/29/20 159 lb (72.1 kg)  02/27/20 160 lb (72.6 kg)  12/28/19 165 lb 3.2 oz (74.9 kg)    Physical Exam Vitals reviewed.  Constitutional:      Appearance: Normal appearance.  HENT:     Right Ear: Hearing, tympanic membrane, ear canal and external ear normal.     Left Ear: Hearing, tympanic membrane, ear canal and external ear normal.     Nose: Mucosal edema, congestion and rhinorrhea present.     Right Turbinates: Not pale.     Left Turbinates: Not pale.     Right Sinus: No maxillary sinus tenderness  or frontal sinus tenderness.     Left Sinus: No maxillary sinus tenderness or frontal sinus tenderness.     Mouth/Throat:     Mouth: Mucous membranes are moist.  Eyes:     General: No scleral icterus.    Extraocular Movements: Extraocular movements intact.     Conjunctiva/sclera: Conjunctivae normal.     Pupils: Pupils are equal, round, and reactive to light.  Cardiovascular:     Rate and Rhythm: Normal rate and regular rhythm.     Heart sounds: No murmur heard.   Pulmonary:     Effort:  Pulmonary effort is normal.     Breath sounds: No stridor. No wheezing, rhonchi or rales.  Abdominal:     General: Abdomen is flat.     Palpations: There is no mass.     Tenderness: There is no abdominal tenderness. There is no guarding.  Musculoskeletal:        General: Normal range of motion.     Cervical back: Normal range of motion.     Right lower leg: No edema.     Left lower leg: No edema.  Skin:    General: Skin is warm and dry.     Coloration: Skin is not pale.  Neurological:     General: No focal deficit present.     Mental Status: She is alert and oriented to person, place, and time. Mental status is at baseline.  Psychiatric:        Mood and Affect: Mood normal.        Behavior: Behavior normal.     Lab Results  Component Value Date   WBC 5.6 02/27/2020   HGB 13.8 02/27/2020   HCT 41.3 02/27/2020   PLT 236 02/27/2020   GLUCOSE 107 (H) 02/27/2020   CHOL 166 11/30/2019   TRIG 53.0 11/30/2019   HDL 81.30 11/30/2019   LDLCALC 74 11/30/2019   ALT 19 11/30/2019   AST 21 11/30/2019   NA 141 02/27/2020   K 3.6 02/27/2020   CL 104 02/27/2020   CREATININE 0.68 02/27/2020   BUN 12 02/27/2020   CO2 30 02/27/2020   TSH 0.38 (L) 12/28/2019   HGBA1C 5.6 07/09/2018    DG Chest 2 View  Result Date: 02/27/2020 CLINICAL DATA:  Chest pain. EXAM: CHEST - 2 VIEW COMPARISON:  None. FINDINGS: The heart size and mediastinal contours are within normal limits. Both lungs are clear. The visualized skeletal structures are unremarkable. IMPRESSION: No active cardiopulmonary disease. Electronically Signed   By: Virgina Norfolk M.D.   On: 02/27/2020 20:37   MR BRAIN WO CONTRAST  Result Date: 02/27/2020 CLINICAL DATA:  Word finding difficulty and blurry vision EXAM: MRI HEAD WITHOUT CONTRAST TECHNIQUE: Multiplanar, multiecho pulse sequences of the brain and surrounding structures were obtained without intravenous contrast. COMPARISON:  None. FINDINGS: Brain: There is no acute  infarction or intracranial hemorrhage. There is no intracranial mass, mass effect, or edema. There is no hydrocephalus or extra-axial fluid collection. Ventricles and sulci are normal in size and configuration. Vascular: Major vessel flow voids at the skull base are preserved. Skull and upper cervical spine: Normal marrow signal is preserved. Sinuses/Orbits: Trace mucosal thickening.  Orbits are unremarkable. Other: Sella is unremarkable.  Mastoid air cells are clear. IMPRESSION: No evidence of recent infarction, hemorrhage, or mass. Electronically Signed   By: Macy Mis M.D.   On: 02/27/2020 22:19   CT Angio Chest/Abd/Pel for Dissection W and/or Wo Contrast  Result Date: 02/28/2020 CLINICAL DATA:  Substernal chest pain, word-finding difficulties and blurred vision which began yesterday EXAM: CT ANGIOGRAPHY CHEST, ABDOMEN AND PELVIS TECHNIQUE: Non-contrast CT of the chest was initially obtained. Multidetector CT imaging through the chest, abdomen and pelvis was performed using the standard protocol during bolus administration of intravenous contrast. Multiplanar reconstructed images and MIPs were obtained and reviewed to evaluate the vascular anatomy. CONTRAST:  132mL OMNIPAQUE IOHEXOL 350 MG/ML SOLN COMPARISON:  CT abdomen pelvis 06/08/2015, chest radiograph 02/27/2020 FINDINGS: CTA CHEST FINDINGS Cardiovascular: Noncontrast CT of the chest performed initially reveals a normal caliber thoracic aorta without abnormal hyperdense mural thickening to suggest intramural hematoma. Postcontrast administration, there is satisfactory opacification of the thoracoabdominal aorta and branch vessels. The aortic root is suboptimally assessed given cardiac pulsation artifact. The aorta is normal caliber. No acute luminal abnormality such as I dissection flap or penetrating ulcer. No periaortic stranding or hemorrhage. Shared origin of the brachiocephalic and left common carotid artery. Proximal great vessels are normally  opacified and free of acute abnormality. Central pulmonary arteries are normal caliber. No large central, lobar or proximal segmental filling defects on this non tailored examination. Small amount of contrast reflux into the azygos and hemi azygous veins. Small amount of gas in the left brachiocephalic vein likely related to intravenous access. No other major venous abnormality. Normal heart size. No pericardial effusion. Mediastinum/Nodes: No mediastinal fluid or gas. Normal thyroid gland and thoracic inlet. No acute abnormality of the trachea or esophagus. No worrisome mediastinal, hilar or axillary adenopathy. Lungs/Pleura: No consolidation, features of edema, pneumothorax, or effusion. No suspicious pulmonary nodules or masses. Musculoskeletal: No acute or suspicious osseous abnormalities. Bilateral breast prostheses with several small radial folds in the right prosthesis no acute abnormality of the implants are evident on this exam. No worrisome chest wall abnormalities. Review of the MIP images confirms the above findings. CTA ABDOMEN AND PELVIS FINDINGS VASCULAR Aorta: Normal caliber aorta. No acute luminal abnormality. No periaortic stranding or hemorrhage. No significant stenosis, occlusion or evidence of vasculitis. Celiac: Slightly hook like configuration with indentation along the superior lumen the proximal vessel best seen on sagittal recon (10/95). Could reflect a mild arcuate ligament compression. Otherwise normal opacification without other significant stenosis or occlusion. Typical branching pattern. No evidence of aneurysm, dissection or vasculitis. SMA: Widely patent and normally opacified. No evidence of aneurysm, dissection or vasculitis. Renals: Single renal arteries bilaterally. Both renal arteries are patent without evidence of aneurysm, dissection, vasculitis, fibromuscular dysplasia or significant stenosis. IMA: Widely patent. Typical branching pattern. No evidence of aneurysm, dissection  or vasculitis. Inflow: Patent without evidence of aneurysm, dissection, vasculitis or significant stenosis. Veins: No obvious venous abnormality within the limitations of this arterial phase study. Proximal outflow vessels including the common femoral, superficial femoral and deep femoral arteries are widely patent as well. Review of the MIP images confirms the above findings. NON-VASCULAR Hepatobiliary: No worrisome focal liver lesions. Smooth liver surface contour. Normal hepatic attenuation. Patient is post cholecystectomy. No significant biliary ductal dilatation or visible calcified intraductal gallstones. Pancreas: Unremarkable. No pancreatic ductal dilatation or surrounding inflammatory changes. Spleen: Heterogeneous attenuation of the spleen is quite typical for the arterial phase of enhancement. Normal splenic size. No concerning splenic lesion. Adrenals/Urinary Tract: Normal adrenal glands. Kidneys enhance symmetrically and uniformly. Subcentimeter hypoattenuating focus in the anterior interpolar left kidney too small to fully characterize on CT imaging but statistically likely benign. No concerning renal lesions. No urolithiasis or hydronephrosis. Urinary bladder is largely decompressed at the time of exam and therefore poorly  evaluated by CT imaging. No gross bladder abnormality is seen. Stomach/Bowel: Distal esophagus, stomach and duodenal sweep are unremarkable. No small bowel wall thickening or dilatation. No evidence of obstruction. The appendix is surgically absent. No colonic dilatation or wall thickening. Lymphatic: No suspicious or enlarged lymph nodes in the included lymphatic chains. Reproductive: Anteverted uterus. No concerning adnexal lesions. Other: No abdominopelvic free fluid or free gas. No bowel containing hernias. Musculoskeletal: Minimal degenerative change at L5-S1. Evidence of prior left femoral intramedullary rod placement and removal. Postsurgical changes are similar in appearance  to comparison. No acute or worrisome osseous lesions. Review of the MIP images confirms the above findings. IMPRESSION: 1. No evidence of acute aortic syndrome. 2. No acute intrathoracic or intra-abdominal process. 3. Slightly hook like configuration of the proximal celiac axis with indentation along the superior lumen the vessel. Could reflect a mild arcuate ligament compression which can be asymptomatic in the majority of patients with this configuration. 4. Prior cholecystectomy, appendectomy and left femoral intramedullary rod placement and removal. Electronically Signed   By: Lovena Le M.D.   On: 02/28/2020 00:32    Assessment & Plan:   [name] was seen today for allergic rhinitis .  Diagnoses and all orders for this visit:  Seasonal allergic rhinitis due to pollen -     levocetirizine (XYZAL) 5 MG tablet; Take 1 tablet (5 mg total) by mouth every evening. -     methylPREDNISolone acetate (DEPO-MEDROL) injection 120 mg  Eustachian tube dysfunction, bilateral -     levocetirizine (XYZAL) 5 MG tablet; Take 1 tablet (5 mg total) by mouth every evening. -     methylPREDNISolone acetate (DEPO-MEDROL) injection 120 mg   I have discontinued [name]'s Vitamin D (Ergocalciferol) and levothyroxine. I am also having her start on levocetirizine. Additionally, I am having her maintain her estradiol, Euthyrox, and Vitamin D. We administered methylPREDNISolone acetate.  Meds ordered this encounter  Medications   levocetirizine (XYZAL) 5 MG tablet    Sig: Take 1 tablet (5 mg total) by mouth every evening.    Dispense:  90 tablet    Refill:  1   methylPREDNISolone acetate (DEPO-MEDROL) injection 120 mg     Follow-up: Return if symptoms worsen or fail to improve.  Kimberly Calico, MD

## 2020-03-01 ENCOUNTER — Ambulatory Visit: Payer: Self-pay | Admitting: Internal Medicine

## 2020-03-17 ENCOUNTER — Telehealth: Payer: Self-pay

## 2020-03-17 NOTE — Telephone Encounter (Signed)
New message     The patient c/o same symptoms as before with her ear is unable to come to the office to pay another bill insurance won't be affected on Sept 1st.   Dr. Ronnald Ramp gave her a shot when she came into the office on  7.19.2021 seen Dr. Ronnald Ramp, asking can antibiotics be called in   [place], [place] [id number]

## 2020-03-17 NOTE — Telephone Encounter (Signed)
New message:   Pt is calling back to check on the status of some medication being sent in for her. Pt would like to hear from someone by the end of the day. Please advise.

## 2020-03-17 NOTE — Telephone Encounter (Signed)
Called pt back and informed that I have a note to Dr. Ronnald Ramp stating that pt is having the same symptoms with no relief from the steroid shot. Pt has not had any fever or chills with her ongoing symptoms and the allergy medication has not helped either.   Informed pt that we would get back with her as soon as we can.

## 2020-03-18 ENCOUNTER — Other Ambulatory Visit: Payer: Self-pay | Admitting: Internal Medicine

## 2020-03-18 DIAGNOSIS — J0101 Acute recurrent maxillary sinusitis: Secondary | ICD-10-CM

## 2020-03-18 MED ORDER — AMOXICILLIN-POT CLAVULANATE 875-125 MG PO TABS: 1.0000 | ORAL_TABLET | Freq: Two times a day (BID) | ORAL | 0 refills | Status: DC

## 2020-03-18 NOTE — Telephone Encounter (Signed)
Left vm for pt informing that Augmentin has been sent in.

## 2020-03-18 NOTE — Telephone Encounter (Signed)
  Follow up message  Patient calling for status of medication

## 2020-03-27 ENCOUNTER — Other Ambulatory Visit: Payer: Self-pay | Admitting: Internal Medicine

## 2020-03-27 NOTE — Telephone Encounter (Signed)
Please refill as per office routine med refill policy (all routine meds refilled for 3 mo or monthly per pt preference up to one year from last visit, then month to month grace period for 3 mo, then further med refills will have to be denied)  

## 2020-04-13 ENCOUNTER — Ambulatory Visit: Payer: Self-pay | Admitting: Internal Medicine

## 2020-04-14 ENCOUNTER — Telehealth: Payer: Self-pay | Admitting: Internal Medicine

## 2020-04-14 DIAGNOSIS — M542 Cervicalgia: Secondary | ICD-10-CM | POA: Diagnosis not present

## 2020-04-14 DIAGNOSIS — F410 Panic disorder [episodic paroxysmal anxiety] without agoraphobia: Secondary | ICD-10-CM | POA: Diagnosis not present

## 2020-04-14 DIAGNOSIS — K1121 Acute sialoadenitis: Secondary | ICD-10-CM | POA: Diagnosis not present

## 2020-04-14 DIAGNOSIS — R519 Headache, unspecified: Secondary | ICD-10-CM | POA: Diagnosis not present

## 2020-04-14 NOTE — Telephone Encounter (Signed)
Patient called requesting an appointment for facial swelling, Patient was seen at [facility] and received antibiotics, but she states it is not working.  Patient states she ha a fever and infection  Patient already scheduled to see Dr. Jenny Reichmann on 9.7.21. Advised patient to return to Urgent care at [facility] and then follow-up with Dr. Jenny Reichmann on Tuesday, 9.7.21

## 2020-04-15 ENCOUNTER — Telehealth: Payer: Self-pay | Admitting: Internal Medicine

## 2020-04-15 ENCOUNTER — Ambulatory Visit (HOSPITAL_COMMUNITY)
Admission: RE | Admit: 2020-04-15 | Discharge: 2020-04-15 | Disposition: A | Discharge status: Home / Self Care | Payer: BC Managed Care – PPO | Attending: Psychiatry | Admitting: Psychiatry

## 2020-04-15 DIAGNOSIS — F419 Anxiety disorder, unspecified: Secondary | ICD-10-CM | POA: Insufficient documentation

## 2020-04-15 NOTE — Telephone Encounter (Signed)
Sent to Dr. John. 

## 2020-04-15 NOTE — Telephone Encounter (Signed)
Sorry, I would not know what to prescribe without an OV
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
## 2020-04-15 NOTE — Telephone Encounter (Signed)
    Patient calling to report she accidentally took too many Xanax Patient states she took 4 pills at once  Call transferred to [facility] for immediate advice
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
## 2020-04-16 ENCOUNTER — Encounter (HOSPITAL_COMMUNITY): Payer: Self-pay | Admitting: Psychiatry

## 2020-04-16 ENCOUNTER — Inpatient Hospital Stay (HOSPITAL_COMMUNITY)
Admission: AD | Admit: 2020-04-16 | Discharge: 2020-04-18 | DRG: 885 | Disposition: A | Discharge status: Home / Self Care | Payer: BC Managed Care – PPO | Attending: Psychiatry | Admitting: Psychiatry

## 2020-04-16 ENCOUNTER — Other Ambulatory Visit: Payer: Self-pay

## 2020-04-16 DIAGNOSIS — Z20822 Contact with and (suspected) exposure to covid-19: Secondary | ICD-10-CM | POA: Diagnosis not present

## 2020-04-16 DIAGNOSIS — F332 Major depressive disorder, recurrent severe without psychotic features: Secondary | ICD-10-CM | POA: Diagnosis not present

## 2020-04-16 DIAGNOSIS — Z823 Family history of stroke: Secondary | ICD-10-CM | POA: Diagnosis not present

## 2020-04-16 DIAGNOSIS — Z8249 Family history of ischemic heart disease and other diseases of the circulatory system: Secondary | ICD-10-CM

## 2020-04-16 DIAGNOSIS — Z83438 Family history of other disorder of lipoprotein metabolism and other lipidemia: Secondary | ICD-10-CM

## 2020-04-16 DIAGNOSIS — Z915 Personal history of self-harm: Secondary | ICD-10-CM

## 2020-04-16 DIAGNOSIS — F419 Anxiety disorder, unspecified: Secondary | ICD-10-CM | POA: Diagnosis present

## 2020-04-16 DIAGNOSIS — R45851 Suicidal ideations: Secondary | ICD-10-CM | POA: Diagnosis present

## 2020-04-16 DIAGNOSIS — R739 Hyperglycemia, unspecified: Secondary | ICD-10-CM | POA: Diagnosis not present

## 2020-04-16 DIAGNOSIS — Z79890 Hormone replacement therapy (postmenopausal): Secondary | ICD-10-CM | POA: Diagnosis not present

## 2020-04-16 DIAGNOSIS — G4700 Insomnia, unspecified: Secondary | ICD-10-CM | POA: Diagnosis not present

## 2020-04-16 DIAGNOSIS — Z833 Family history of diabetes mellitus: Secondary | ICD-10-CM

## 2020-04-16 DIAGNOSIS — E559 Vitamin D deficiency, unspecified: Secondary | ICD-10-CM | POA: Diagnosis not present

## 2020-04-16 DIAGNOSIS — Z818 Family history of other mental and behavioral disorders: Secondary | ICD-10-CM | POA: Diagnosis not present

## 2020-04-16 DIAGNOSIS — E039 Hypothyroidism, unspecified: Secondary | ICD-10-CM | POA: Diagnosis not present

## 2020-04-16 LAB — CBC
HCT: 44.8 % (ref 36.0–46.0)
Hemoglobin: 14.6 g/dL (ref 12.0–15.0)
MCH: 32.4 pg (ref 26.0–34.0)
MCHC: 32.6 g/dL (ref 30.0–36.0)
MCV: 99.3 fL (ref 80.0–100.0)
Platelets: 299 10*3/uL (ref 150–400)
RBC: 4.51 MIL/uL (ref 3.87–5.11)
RDW: 13.6 % (ref 11.5–15.5)
WBC: 6.2 10*3/uL (ref 4.0–10.5)
nRBC: 0 % (ref 0.0–0.2)

## 2020-04-16 LAB — BASIC METABOLIC PANEL
Anion gap: 9 (ref 5–15)
BUN: 23 mg/dL — ABNORMAL HIGH (ref 6–20)
CO2: 29 mmol/L (ref 22–32)
Calcium: 9.8 mg/dL (ref 8.9–10.3)
Chloride: 103 mmol/L (ref 98–111)
Creatinine, Ser: 0.72 mg/dL (ref 0.44–1.00)
GFR calc Af Amer: >60 mL/min (ref >60)
GFR calc non Af Amer: >60 mL/min (ref >60)
Glucose, Bld: 84 mg/dL (ref 70–99)
Potassium: 4.1 mmol/L (ref 3.5–5.1)
Sodium: 141 mmol/L (ref 135–145)

## 2020-04-16 LAB — SARS CORONAVIRUS 2 BY RT PCR (HOSPITAL ORDER, PERFORMED IN ~~LOC~~ HOSPITAL LAB): SARS Coronavirus 2: NEGATIVE

## 2020-04-16 LAB — TSH: TSH: 0.514 u[IU]/mL (ref 0.350–4.500)

## 2020-04-16 MED ORDER — ACETAMINOPHEN 325 MG PO TABS
650.0000 mg | ORAL_TABLET | Freq: Four times a day (QID) | ORAL | Status: DC | PRN
  Administered 2020-04-17 (×2): 650 mg via ORAL
  Filled 2020-04-16 (×2): qty 2

## 2020-04-16 MED ORDER — LEVOCETIRIZINE DIHYDROCHLORIDE 5 MG PO TABS: 5.0000 mg | ORAL_TABLET | Freq: Every evening | ORAL | Status: DC

## 2020-04-16 MED ORDER — CITALOPRAM HYDROBROMIDE 20 MG PO TABS
20.0000 mg | ORAL_TABLET | Freq: Every day | ORAL | Status: DC
  Administered 2020-04-16 – 2020-04-18 (×3): 20 mg via ORAL
  Filled 2020-04-16 (×2): qty 1
  Filled 2020-04-16: qty 7
  Filled 2020-04-16 (×2): qty 1
  Filled 2020-04-16: qty 7
  Filled 2020-04-16 (×2): qty 1

## 2020-04-16 MED ORDER — LEVOTHYROXINE SODIUM 112 MCG PO TABS
112.0000 ug | ORAL_TABLET | Freq: Every day | ORAL | Status: DC
  Administered 2020-04-17 – 2020-04-18 (×2): 112 ug via ORAL
  Filled 2020-04-16: qty 7
  Filled 2020-04-16 (×2): qty 1
  Filled 2020-04-16: qty 7
  Filled 2020-04-16 (×2): qty 1

## 2020-04-16 MED ORDER — TRAZODONE HCL 50 MG PO TABS
50.0000 mg | ORAL_TABLET | Freq: Every evening | ORAL | Status: DC | PRN
  Administered 2020-04-16 – 2020-04-17 (×2): 50 mg via ORAL
  Filled 2020-04-16 (×2): qty 1
  Filled 2020-04-16: qty 7

## 2020-04-16 MED ORDER — MAGNESIUM HYDROXIDE 400 MG/5ML PO SUSP: 30.0000 mL | Freq: Every day | ORAL | Status: DC | PRN

## 2020-04-16 MED ORDER — ALUM & MAG HYDROXIDE-SIMETH 200-200-20 MG/5ML PO SUSP: 30.0000 mL | ORAL | Status: DC | PRN

## 2020-04-16 MED ORDER — ONDANSETRON 4 MG PO TBDP: 4.0000 mg | ORAL_TABLET | Freq: Three times a day (TID) | ORAL | Status: DC | PRN

## 2020-04-16 MED ORDER — LORATADINE 10 MG PO TABS
10.0000 mg | ORAL_TABLET | Freq: Every day | ORAL | Status: DC
  Administered 2020-04-16 – 2020-04-17 (×2): 10 mg via ORAL
  Filled 2020-04-16 (×2): qty 1
  Filled 2020-04-16: qty 7
  Filled 2020-04-16: qty 1
  Filled 2020-04-16: qty 7
  Filled 2020-04-16: qty 1

## 2020-04-16 MED ORDER — GABAPENTIN 100 MG PO CAPS
100.0000 mg | ORAL_CAPSULE | Freq: Three times a day (TID) | ORAL | Status: DC
  Administered 2020-04-16 – 2020-04-18 (×6): 100 mg via ORAL
  Filled 2020-04-16 (×2): qty 21
  Filled 2020-04-16: qty 1
  Filled 2020-04-16: qty 21
  Filled 2020-04-16 (×2): qty 1
  Filled 2020-04-16: qty 21
  Filled 2020-04-16 (×2): qty 1
  Filled 2020-04-16: qty 21
  Filled 2020-04-16 (×5): qty 1
  Filled 2020-04-16: qty 21
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
## 2020-04-16 NOTE — Progress Notes (Signed)
   04/16/20 2150  Psych Admission Type (Psych Patients Only)  Admission Status Voluntary  Psychosocial Assessment  Patient Complaints Anxiety  Eye Contact Fair  Facial Expression Anxious  Affect Appropriate to circumstance  Speech Logical/coherent  Interaction Assertive  Motor Activity Other (Comment) ([facility])  Appearance/Hygiene Unremarkable  Behavior Characteristics Appropriate to situation  Mood Anxious  Thought Process  Coherency WDL  Content WDL  Delusions None reported or observed  Perception WDL  Hallucination None reported or observed  Judgment Impaired  Confusion None  Danger to Self  Current suicidal ideation? Denies  Danger to Others  Danger to Others None reported or observed  Danger to Others Abnormal  Harmful Behavior to others No threats or harm toward other people  Destructive Behavior No threats or harm toward property

## 2020-04-16 NOTE — Progress Notes (Signed)
   04/16/20 2147  COVID-19 Daily Checkoff  Have you had a fever (temp > 37.80C/100F)  in the past 24 hours?  No  If you have had runny nose, nasal congestion, sneezing in the past 24 hours, has it worsened? No  COVID-19 EXPOSURE  Have you traveled outside the state in the past 14 days? No  Have you been in contact with someone with a confirmed diagnosis of COVID-19 or PUI in the past 14 days without wearing appropriate PPE? No  Have you been living in the same home as a person with confirmed diagnosis of COVID-19 or a PUI (household contact)? No  Have you been diagnosed with COVID-19? No

## 2020-04-16 NOTE — Progress Notes (Signed)
Patient signed a 72 hour request for discharge on 04/16/20 at 1630. The form was reviewed with the pt and the pt verbalizes understanding.

## 2020-04-16 NOTE — Progress Notes (Signed)
Adult Psychoeducational Group Note  Date:  04/16/2020 Time:  10:27 PM  Group Topic/Focus:  Wrap-Up Group:   The focus of this group is to help patients review their daily goal of treatment and discuss progress on daily workbooks.  Participation Level:  Active  Participation Quality:  Appropriate  Affect:  Appropriate  Cognitive:  Alert  Insight: Improving  Engagement in Group:  Developing/Improving  Modes of Intervention:  Education and Support  Additional Comments: Patient says she is very stressed and depressed, she has been working for over 70 hours a week to take care of her daughter who is 30 years of age. She does not have time for herself and hence, her stress and depression reached a point that she is now here for help. Her goal is to bring down her stress level while here.  [name]  [name] 04/16/2020, 10:27 PM

## 2020-04-16 NOTE — H&P (Signed)
Behavioral Health Medical Screening Exam  [name] is an 49 y.o. female.  Total Time spent with patient: 45 minutes  Psychiatric Specialty Exam: Physical Exam Vitals and nursing note reviewed.  Constitutional:      Appearance: Normal appearance.  HENT:     Head: Normocephalic.     Nose: Nose normal.  Pulmonary:     Effort: Pulmonary effort is normal.  Musculoskeletal:        General: Normal range of motion.     Cervical back: Normal range of motion.  Neurological:     General: No focal deficit present.     Mental Status: She is alert and oriented to person, place, and time.  Psychiatric:        Attention and Perception: She is inattentive.        Mood and Affect: Mood is anxious and depressed.        Speech: Speech normal.        Behavior: Behavior normal. Behavior is cooperative.        Thought Content: Thought content includes suicidal ideation. Thought content includes suicidal plan.        Cognition and Memory: Cognition and memory normal.        Judgment: Judgment is impulsive.    Review of Systems  Psychiatric/Behavioral: Positive for dysphoric mood and suicidal ideas. The patient is nervous/anxious.   All other systems reviewed and are negative.  Blood pressure (!) 102/44, pulse 79, temperature 97.9 F (36.6 C), temperature source Oral, resp. rate 18, height 5' 7.5" (1.715 m), weight 68 kg, last menstrual period 01/02/2016, SpO2 98 %.Body mass index is 23.15 kg/m. General Appearance: Casual Eye Contact:  Fair Speech:  Normal Rate Volume:  Normal Mood:  Anxious and Depressed Affect:  Congruent Thought Process:  Coherent and Descriptions of Associations: Intact Orientation:  Full (Time, Place, and Person) Thought Content:  Rumination Suicidal Thoughts:  Yes.  with intent/plan Homicidal Thoughts:  No Memory:  Immediate;   Fair Recent;   Fair Remote;   Fair Judgement:  Poor Insight:  Fair Psychomotor Activity:  Normal Concentration: Concentration: Fair  and Attention Span: Fair Recall:  [employer] of Knowledge:Fair Language: Good Akathisia:  No Handed:  Right AIMS (if indicated):    Assets:  Housing Leisure Time Physical Health Resilience Social Support Vocational/Educational Sleep:     Musculoskeletal: Strength & Muscle Tone: within normal limits Gait & Station: normal Patient leans: N/A  Blood pressure (!) 102/44, pulse 79, temperature 97.9 F (36.6 C), temperature source Oral, resp. rate 18, height 5' 7.5" (1.715 m), weight 68 kg, last menstrual period 01/02/2016, SpO2 98 %.  Recommendations: Based on my evaluation the patient does not appear to have an emergency medical condition.  Waylan Boga, NP 04/16/2020, 5:04 PM

## 2020-04-16 NOTE — Tx Team (Signed)
Initial Treatment Plan 04/16/2020 4:59 PM [name] HUD:[id number]    PATIENT STRESSORS: Financial difficulties Health problems Occupational concerns   PATIENT STRENGTHS: Average or above average intelligence Capable of independent living Motivation for treatment/growth Supportive family/friends Work skills   PATIENT IDENTIFIED PROBLEMS: "I need to work on my anxiety and depression"  Financial concerns due new employment  Suicidal Ideation                 DISCHARGE CRITERIA:  Improved stabilization in mood, thinking, and/or behavior Motivation to continue treatment in a less acute level of care Safe-care adequate arrangements made  PRELIMINARY DISCHARGE PLAN: Outpatient therapy Return to previous living arrangement Return to previous work or school arrangements  PATIENT/FAMILY INVOLVEMENT: This treatment plan has been presented to and reviewed with the patient, [name].  The patient and family have been given the opportunity to ask questions and make suggestions.  Zipporah Plants, RN 04/16/2020, 4:59 PM

## 2020-04-16 NOTE — Progress Notes (Signed)
Admission note: Patient is a 49 yo voluntary patient admitted for anxiety, depression, and suicidal ideation. Patient was a walk in with her ex-husband and 99 yo daughter. She states she is anxious, in pain, and depression. She states, "If I could just die." Patient has recently changed employment due to working 70 hours per week. She became so stressed and anxious, she states she was walking around making comments, "I'm going to lose my daughter." She changed employment and receives less money on her paycheck, and she states she had to move in with her ex-husband, who is supportive. Patient has somatic complaints, as well as severe anxiety attacks. She denies any thoughts of self harm.

## 2020-04-17 DIAGNOSIS — F332 Major depressive disorder, recurrent severe without psychotic features: Principal | ICD-10-CM

## 2020-04-17 DIAGNOSIS — F419 Anxiety disorder, unspecified: Secondary | ICD-10-CM | POA: Diagnosis present

## 2020-04-17 LAB — PREGNANCY, URINE: Preg Test, Ur: NEGATIVE

## 2020-04-17 MED ORDER — HYDROXYZINE HCL 25 MG PO TABS
25.0000 mg | ORAL_TABLET | Freq: Four times a day (QID) | ORAL | Status: DC | PRN
  Administered 2020-04-17 (×2): 25 mg via ORAL
  Filled 2020-04-17 (×2): qty 1
  Filled 2020-04-17: qty 10

## 2020-04-17 NOTE — Progress Notes (Signed)
Pt was focused on discharge tonight. Pt shared with this Probation officer that she had met with the social worker earlier in the afternoon and had signed everything that she needed to. She said the forms were placed on her shadow chart. Pt has already filled out her suicide safety plan and is on her shadow chart. Pt was sharing with staff that she's worried about her new job and being able to keep it. Pt said that she's been working on controlling her anxiety. She identified her coping skills as deep breathing and visualization. Pt provided active listening, reassurance, and support. Medications administered as ordered by MD. Pt denies SI/HI and AVH. Q 15 min safety checks continue. Pt's safety has been maintained.   04/17/20 2050  Psych Admission Type (Psych Patients Only)  Admission Status Voluntary  Psychosocial Assessment  Patient Complaints Anxiety  Eye Contact Fair  Facial Expression Animated;Anxious  Affect Appropriate to circumstance;Anxious  Speech Logical/coherent  Interaction Assertive;Attention-seeking  Motor Activity Fidgety  Appearance/Hygiene Unremarkable  Behavior Characteristics Cooperative;Anxious  Mood Anxious  Thought Process  Coherency WDL  Content WDL  Delusions None reported or observed  Perception WDL  Hallucination None reported or observed  Judgment Poor  Confusion None  Danger to Self  Current suicidal ideation? Denies  Danger to Others  Danger to Others None reported or observed  Danger to Others Abnormal  Harmful Behavior to others No threats or harm toward other people

## 2020-04-17 NOTE — [facility] Group Notes (Signed)
Adult Psychoeducational Group Not Date:  04/17/2020 Time:  9:00am-9:45am  Group Topic/Focus: PROGRESSIVE RELAXATION. A group where deep breathing is taught and tensing and relaxation muscle groups is used. Imagery is used as well.  Pts are asked to imagine 3 pillars that hold them up when they are not able to hold themselves up.  Participation Level:  Active  Participation Quality:  Appropriate  Affect:  Appropriate  Cognitive:  Oriented  Insight: Appropriate  Engagement in Group:  Engaged  Modes of Intervention:  Activity, Discussion, Education, and Support  Additional Comments:  Rates her energy as a 5/10 States that her daughter, spiritual life and her son are the pillars that hold her up and give her strength  Paulino Rily 04/17/2020

## 2020-04-17 NOTE — Progress Notes (Signed)
   04/17/20 2050  COVID-19 Daily Checkoff  Have you had a fever (temp > 37.80C/100F)  in the past 24 hours?  No  COVID-19 EXPOSURE  Have you traveled outside the state in the past 14 days? No  Have you been in contact with someone with a confirmed diagnosis of COVID-19 or PUI in the past 14 days without wearing appropriate PPE? No  Have you been living in the same home as a person with confirmed diagnosis of COVID-19 or a PUI (household contact)? No  Have you been diagnosed with COVID-19? No

## 2020-04-17 NOTE — [facility] Suicide Risk Assessment (Signed)
[facility] Admission Suicide Risk Assessment   Nursing information obtained from:  Patient Demographic factors:  Divorced or widowed, Caucasian, Low socioeconomic status, Unemployed Current Mental Status:  Suicidal ideation indicated by others, Self-harm behaviors Loss Factors:  Financial problems / change in socioeconomic status Historical Factors:  Impulsivity Risk Reduction Factors:  Responsible for children under 49 years of age, Sense of responsibility to family, Positive social support  Total Time spent with patient: 1 hour Principal Problem: Major depressive disorder, recurrent severe without psychotic features ([place]) Diagnosis:  Principal Problem:   Major depressive disorder, recurrent severe without psychotic features ([place]) Active Problems:   Hypothyroidism   INSOMNIA   Hyperglycemia   Vitamin D deficiency   Anxiety disorder, unspecified  Subjective Data: "I have been having a lot of anxiety which is made my depression much worse." History of Present Illness: Female admitted yesterday evening for severe anxiety/ Panic attacks.  Patient walked in to [facility] twice in 24 hours for same com plaint of anxiety.  Patient complains about neck pain and stomach discomfort.  She was seen  In July for almost same complaint and at there time CT head was done that came back negative.  This time she was suicidal on arrival but today denies so.  Patient states she does not know why she is having anxiety that paralyzes her.  She reports that she suddenly left her former job due to stress and long hours.  She started a new job that she likes but not paying enough money.  She is divorced but is working with ex husband to get back together.  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   She informs Probation officer she is anxious about this job -night shift.  She is  Worried about staying awake at night and she is also worried that if she quit working at all she may not have any  income and may not be able to help her daughter.  She is worried she loose everything.  Patient admits this morning that she is worried about her moving from job to job and continuing to work at all.  She has a 10 years old daughter that will soon go to collage and without steady income she may not be able to help her.  She reports back pain which she plans to see a neurologist this coming Friday.  She falls asleep easily but wakes up after 4 hours sleep.  She reports fairly okay appetite and she eats two times a day with snacks.  We discussed adding a non habit forming anxiolytic like Hydroxyzine for anxiety.  Patient is in agreement and  we will continue to offer Trazodone for sleep, Celexa for depression and anxiety and Gabapentin for mood and anxiety.  We will consider Bipolar disorder for this patient due to her report of suddenly impulsively leaving her job and considering leaving another job and worried about the new one.  She denies hx of Bipolar disorder in the family. Associated Signs/Symptoms: Depression Symptoms:  depressed mood, insomnia, fatigue, difficulty concentrating, anxiety, loss of energy/fatigue, (Hypo) Manic Symptoms:  Impulsivity, Anxiety Symptoms:  Excessive Worry, Panic Symptoms, Worrying about the job she suddenly left and new night shift job she is about to start. Psychotic Symptoms:  na PTSD Symptoms: na   Continued Clinical Symptoms:  Alcohol Use Disorder Identification Test Final Score (AUDIT): 2 The "Alcohol Use Disorders Identification Test", Guidelines for Use in Primary Care, Second  Edition.  World [facility]). Score between 0-7:  no or low risk or alcohol related problems. Score between 8-15:  moderate risk of alcohol related problems. Score between 16-19:  high risk of alcohol related problems. Score 20 or above:  warrants further diagnostic evaluation for alcohol dependence and treatment.   CLINICAL FACTORS:   Severe Anxiety and/or  Agitation Depression:   Impulsivity Insomnia Severe Musculoskeletal: Strength & Muscle Tone: within normal limits Gait & Station: normal Patient leans: N/A  Psychiatric Specialty Exam: Physical Exam Vitals and nursing note reviewed.  Constitutional:      Appearance: Normal appearance.  HENT:     Head: Normocephalic and atraumatic.  Cardiovascular:     Rate and Rhythm: Normal rate and regular rhythm.  Musculoskeletal:        General: Normal range of motion.     Cervical back: Normal range of motion.  Skin:    General: Skin is warm.  Neurological:     Mental Status: She is alert.     Review of Systems  Constitutional: Negative.   HENT: Negative for ear pain.        Left ear tingling/pressure  Eyes:       Let eye blurry  Respiratory: Negative.   Cardiovascular: Negative.   Gastrointestinal:       Stomach churning, queezy  Endocrine: Negative.   Genitourinary: Negative.   Musculoskeletal: Positive for back pain and neck pain.  Skin: Negative.   Neurological: Negative.   Hematological: Negative.   Psychiatric/Behavioral: The patient is nervous/anxious.     Blood pressure 97/64, pulse 69, temperature 98.1 F (36.7 C), temperature source Oral, resp. rate 18, height 5' 7.5" (1.715 m), weight 68 kg, last menstrual period 01/02/2016, SpO2 99 %.Body mass index is 23.15 kg/m.  General Appearance: Casual and Fairly Groomed  Eye Contact:  Absent  Speech:  Clear and Coherent and Normal Rate  Volume:  Normal  Mood:  Anxious  Affect:  Congruent  Thought Process:  Coherent  Orientation:  Full (Time, Place, and Person)  Thought Content:  Logical  Suicidal Thoughts:  No  Homicidal Thoughts:  No  Memory:  Immediate;   Fair Recent;   Fair Remote;   Fair  Judgement:  Poor  Insight:  Fair  Psychomotor Activity:  Normal  Concentration:  Concentration: Fair and Attention Span: Fair  Recall:  [employer] of Knowledge:  Fair  Language:  Good  Akathisia:  No  Handed:  Right   AIMS (if indicated):     Assets:   Communication Skills Desire for Improvement Housing Intimacy Vocational/Educational  ADL's:  Intact  Cognition:  WNL  Sleep:   poor     COGNITIVE FEATURES THAT CONTRIBUTE TO RISK:  Thought constriction (tunnel vision)    SUICIDE RISK:   Moderate:  Frequent suicidal ideation with limited intensity, and duration, some specificity in terms of plans, no associated intent, good self-control, limited dysphoria/symptomatology, some risk factors present, and identifiable protective factors, including available and accessible social support.  PLAN OF CARE:  Daily contact with patient to assess and evaluate symptoms and progress in treatment and Medication management  Plan:  -Continue Celexa 20 mg po daily for depression/Anxiety -Gabapentin 100 mg po three times a day for anxiety/mood -Continue Trazodone 50 mg po at bed time as needed for sleep -Continue Hydroxyzine 25 mg po q 6 hours as needed for anxiety -Continue Synthroid 112 mcg  Po daily for hypothyroidism -Individual therapy daily regarding job issues and anxiety.  Observation Level/Precautions:  15 minute checks  Laboratory:  UDS UA Need UA/UDS.  Rest of lab results wnl.  Psychotherapy:  Encouraged to participate  Medications:  See MAR  Consultations:  NA  Discharge Concerns:  NA  Estimated TIK:0-8 DAYS     I certify that inpatient services furnished can reasonably be expected to improve the patient's condition.   Lavella Hammock, MD 04/17/2020, 6:22 PM

## 2020-04-17 NOTE — Progress Notes (Signed)
Patient presented to the nurses station c/o physical symptoms of an upset stomach and tingling in the back of her head. She was noted to be fidgety on approach and reported feeling anxious. She asked for medications to help with her stomach pain. She asked to take the same medications that was given to her yesterday when she first came in to the hospital because it was helpful. Writer discussed the medications with the pt and explained to the pt that she was administered Celexa, Neurontin and Claritin yesterday afternoon. Writer explained to the pt that she would be receiving the Celexa and Neurontin this morning as scheduled, same medications as yesterday. The patient agreed to taking the medications at that time. She later returned to the nurses station an hour later stating she was feeling anxious and believes something is wrong with her physically. Writer notified the NP about the pt's complaints.  The pt was ordered vistaril and was administered vistaril as ordered for anxiety. Pt denies SI/HI. She denies AVH. Her stated goal for today, "how to work pass this anxiety."  Orders reviewed. Vital signs reviewed. Verbal support provided. Pt encouraged to attend groups. 15 minute checks performed for safety.  Pt receptive to tx plan.
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
## 2020-04-17 NOTE — [facility] Group Notes (Signed)
[facility] LCSW Group Therapy Note  04/17/2020    Type of Therapy and Topic:  Group Therapy:  A Hero Worthy of Support  Participation Level:  Active   Description of Group:  Patients in this group were introduced to the concept that additional supports including self-support are an essential part of recovery.  Matching needs with supports to help fulfill those needs was explained.  Establishing boundaries that can gradually be increased or decreased was described, with patients giving their own examples of establishing appropriate boundaries in their lives.  A song entitled "My Own Hero" was played and a group discussion ensued in which patients stated it inspired them to help themselves in order to succeed, because other people cannot achieve their goals such as sobriety or stability for them.  A song was played called "I Am Enough" which led to a discussion about being willing to believe we are worth the effort of being a self-support.   Therapeutic Goals: 1)  demonstrate the importance of being a key part of one's own support system 2)  discuss various available supports 3)  encourage patient to use music as part of their self-support and focus on goals 4)  elicit ideas from patients about supports that need to be added   Summary of Patient Progress:  The patient expressed that her daughter and sons are healthy supports while her mother can at different times be unhealthy or healthy for her.  Mother is often critical, but means well.  The patient stated that she is a poor self-support.  She agreed with many of the comments being made in group.  Therapeutic Modalities:   Motivational Interviewing Activity  Maretta Los

## 2020-04-17 NOTE — H&P (Addendum)
Psychiatric Admission Assessment Adult  Patient Identification: [name] MRN:  [id number] Date of Evaluation:  04/17/2020 Chief Complaint:  Major depressive disorder, recurrent severe without psychotic features ([name]) [F33.2] Principal Diagnosis: Major depressive disorder, recurrent severe without psychotic features ([name]) Diagnosis:  Principal Problem:   Major depressive disorder, recurrent severe without psychotic features ([name])  History of Present Illness: Female admitted yesterday evening for severe anxiety/ Panic attacks.  Patient walked in to [facility] twice in 24 hours for same com plaint of anxiety.  Patient complains about neck pain and stomach discomfort.  She was seen  In July for almost same complaint and at there time CT head was done that came back negative.  This time she was suicidal on arrival but today denies so.  Patient states she does not know why she is having anxiety that paralyzes her.  She reports that she suddenly left her former job due to stress and long hours.  She started a new job that she likes but not paying enough money.  She is divorced but is working with ex husband to get back together.  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   She informs Probation officer she is anxious about this job -night shift.  She is  Worried about staying awake at night and she is also worried that if she quit working at all she may not have any income and may not be able to help her daughter.  She is worried she loose everything.  Patient admits this morning that she is worried about her moving from job to job and continuing to work at all.  She has a 49 years old daughter that will soon go to collage and without steady income she may not be able to help her.  She reports back pain which she plans to see a neurologist this coming Friday.  She falls asleep easily but wakes up after 4 hours sleep.  She reports fairly okay appetite and she eats two times a day  with snacks.  We discussed adding a non habit forming anxiolytic like Hydroxyzine for anxiety.  Patient is in agreement and  we will continue to offer Trazodone for sleep, Celexa for depression and anxiety and Gabapentin for mood and anxiety.  We will consider Bipolar disorder for this patient due to her report of suddenly impulsively leaving her job and considering leaving another job and worried about the new one.  She denies hx of Bipolar disorder in the family. Associated Signs/Symptoms: Depression Symptoms:  depressed mood, insomnia, fatigue, difficulty concentrating, anxiety, loss of energy/fatigue, (Hypo) Manic Symptoms:  Impulsivity, Anxiety Symptoms:  Excessive Worry, Panic Symptoms, Worrying about the job she suddenly left and new night shift job she is about to start. Psychotic Symptoms:  na PTSD Symptoms: na Total Time spent with patient: 45 minutes  Past Psychiatric History: Depression  Is the patient at risk to self? No.  Has the patient been a risk to self in the past 6 months? Yes.    Has the patient been a risk to self within the distant past? Yes.    Is the patient a risk to others? No.  Has the patient been a risk to others in the past 6 months? No.  Has the patient been a risk to others within the distant past? No.   Prior Inpatient Therapy:   Prior Outpatient Therapy:    Alcohol Screening: 1. How often do you have  a drink containing alcohol?: 2 to 4 times a month 2. How many drinks containing alcohol do you have on a typical day when you are drinking?: 1 or 2 3. How often do you have six or more drinks on one occasion?: Never AUDIT-C Score: 2 9. Have you or someone else been injured as a result of your drinking?: No 10. Has a relative or friend or a doctor or another health worker been concerned about your drinking or suggested you cut down?: No Alcohol Use Disorder Identification Test Final Score (AUDIT): 2 Alcohol Brief Interventions/Follow-up: AUDIT Score  <7 follow-up not indicated Substance Abuse History in the last 12 months:  No. Consequences of Substance Abuse: NA Previous Psychotropic Medications: No  Psychological Evaluations: No  Past Medical History:  Past Medical History:  Diagnosis Date  . Abdominal pain, epigastric 06/19/2010  . Allergic rhinitis 07/09/2018  . Allergy   . Cancer [facility])    thyroid cancer   . DEPRESSION 05/02/2010  . HLD (hyperlipidemia) 07/09/2018  . HYPERTHYROIDISM 05/02/2010  . HYPOTHYROIDISM 05/02/2010  . INSOMNIA 05/02/2010  . NAUSEA 06/19/2010  . TRANSAMINASES, SERUM, ELEVATED 05/02/2010    Past Surgical History:  Procedure Laterality Date  . APPENDECTOMY    . CESAREAN SECTION     x 2  . CHOLECYSTECTOMY  2011  . LUMBAR LAMINECTOMY/DECOMPRESSION MICRODISCECTOMY N/A 01/13/2016   Procedure: MICRO LUMBAR DECOMPRESSION L5-S1, L4-L5;  Surgeon: Susa Day, MD;  Location: [facility] ORS;  Service: Orthopedics;  Laterality: N/A;  . s/p compound fx left femur s/p rod, now removed    . TUBAL LIGATION     Family History:  Family History  Problem Relation Age of Onset  . Heart disease Other   . Hypertension Other   . Hyperlipidemia Other   . Stroke Other   . Diabetes Other   . COPD Other   . Stroke Mother   . Depression Mother   . Alcohol abuse Mother   . Alcohol abuse Father   . Heart disease Father   . Hyperlipidemia Father   . Hypertension Father   . Diabetes Father   . COPD Other   . Cancer Paternal Uncle        mets all over   . Breast cancer Other        M. Great grandmother   Family Psychiatric  History: Depression-mother, cousin on mother's side completed suicide by gun shot wound Tobacco Screening: Have you used any form of tobacco in the last 30 days? (Cigarettes, Smokeless Tobacco, Cigars, and/or Pipes): No Social History:  Social History   Substance and Sexual Activity  Alcohol Use Yes  . Alcohol/week: 0.0 standard drinks   Comment: Occ- wine      Social History   Substance and Sexual  Activity  Drug Use No    Additional Social History:                           Allergies:   Allergies  Allergen Reactions  . Sulfa Antibiotics Hives   Lab Results:  Results for orders placed or performed during the hospital encounter of 04/16/20 (from the past 48 hour(s))  SARS Coronavirus 2 by RT PCR (hospital order, performed in [facility] hospital lab) Nasopharyngeal Nasopharyngeal Swab     Status: None   Collection Time: 04/16/20  1:57 PM   Specimen: Nasopharyngeal Swab  Result Value Ref Range   SARS Coronavirus 2 NEGATIVE NEGATIVE    Comment: (NOTE) SARS-CoV-2  target nucleic acids are NOT DETECTED.  The SARS-CoV-2 RNA is generally detectable in upper and lower respiratory specimens during the acute phase of infection. The lowest concentration of SARS-CoV-2 viral copies this assay can detect is 250 copies / mL. A negative result does not preclude SARS-CoV-2 infection and should not be used as the sole basis for treatment or other patient management decisions.  A negative result may occur with improper specimen collection / handling, submission of specimen other than nasopharyngeal swab, presence of viral mutation(s) within the areas targeted by this assay, and inadequate number of viral copies (<250 copies / mL). A negative result must be combined with clinical observations, patient history, and epidemiological information.  Fact Sheet for Patients:   StrictlyIdeas.no  Fact Sheet for Healthcare Providers: BankingDealers.co.za  This test is not yet approved or  cleared by the [place] FDA and has been authorized for detection and/or diagnosis of SARS-CoV-2 by FDA under an Emergency Use Authorization (EUA).  This EUA will remain in effect (meaning this test can be used) for the duration of the COVID-19 declaration under Section 564(b)(1) of the Act, 21 U.S.C. section 360bbb-3(b)(1), unless the authorization is  terminated or revoked sooner.  Performed at [facility], [place] [address], [place], [place] [address]   Pregnancy, urine     Status: None   Collection Time: 04/16/20  4:51 PM  Result Value Ref Range   Preg Test, Ur NEGATIVE NEGATIVE    Comment: Performed at [facility], [place] [address] [place], [place] [address]  CBC     Status: None   Collection Time: 04/16/20  5:54 PM  Result Value Ref Range   WBC 6.2 4.0 - 10.5 K/uL   RBC 4.51 3.87 - 5.11 MIL/uL   Hemoglobin 14.6 12.0 - 15.0 g/dL   HCT 44.8 36 - 46 %   MCV 99.3 80.0 - 100.0 fL   MCH 32.4 26.0 - 34.0 pg   MCHC 32.6 30.0 - 36.0 g/dL   RDW 13.6 11.5 - 15.5 %   Platelets 299 150 - 400 K/uL   nRBC 0.0 0.0 - 0.2 %    Comment: Performed at [facility], [name] [address] [place], [place] [address]  Basic metabolic panel     Status: Abnormal   Collection Time: 04/16/20  5:54 PM  Result Value Ref Range   Sodium 141 135 - 145 mmol/L   Potassium 4.1 3.5 - 5.1 mmol/L   Chloride 103 98 - 111 mmol/L   CO2 29 22 - 32 mmol/L   Glucose, Bld 84 70 - 99 mg/dL    Comment: Glucose reference range applies only to samples taken after fasting for at least 8 hours.   BUN 23 (H) 6 - 20 mg/dL   Creatinine, Ser 0.72 0.44 - 1.00 mg/dL   Calcium 9.8 8.9 - 10.3 mg/dL   GFR calc non Af Amer >60 >60 mL/min   GFR calc Af Amer >60 >60 mL/min   Anion gap 9 5 - 15    Comment: Performed at [facility], [place] [address], [place], [place] [address]  TSH     Status: None   Collection Time: 04/16/20  5:54 PM  Result Value Ref Range   TSH 0.514 0.350 - 4.500 uIU/mL    Comment: Performed by a 3rd Generation assay with a functional sensitivity of <=0.01 uIU/mL. Performed at [facility], [place] [address] [place],  [address]  Blood Alcohol level:  No results found for: [facility]  Metabolic Disorder Labs:  Lab Results  Component Value Date   HGBA1C 5.6  07/09/2018   No results found for: PROLACTIN Lab Results  Component Value Date   CHOL 166 11/30/2019   TRIG 53.0 11/30/2019   HDL 81.30 11/30/2019   CHOLHDL 2 11/30/2019   VLDL 10.6 11/30/2019   LDLCALC 74 11/30/2019   LDLCALC 87 07/09/2018    Current Medications: Current Facility-Administered Medications  Medication Dose Route Frequency Provider Last Rate Last Admin  . acetaminophen (TYLENOL) tablet 650 mg  650 mg Oral Q6H PRN Patrecia Pour, NP   650 mg at 04/17/20 0528  . alum & mag hydroxide-simeth (MAALOX/MYLANTA) 200-200-20 MG/5ML suspension 30 mL  30 mL Oral Q4H PRN Patrecia Pour, NP      . citalopram (CELEXA) tablet 20 mg  20 mg Oral Daily Patrecia Pour, NP   20 mg at 04/17/20 0744  . gabapentin (NEURONTIN) capsule 100 mg  100 mg Oral TID Patrecia Pour, NP   100 mg at 04/17/20 0744  . hydrOXYzine (ATARAX/VISTARIL) tablet 25 mg  25 mg Oral Q6H PRN Charmaine Downs C, NP   25 mg at 04/17/20 1024  . levothyroxine (SYNTHROID) tablet 112 mcg  112 mcg Oral Q0600 Patrecia Pour, NP   112 mcg at 04/17/20 0526  . loratadine (CLARITIN) tablet 10 mg  10 mg Oral q1800 Sharma Covert, MD   10 mg at 04/16/20 1719  . magnesium hydroxide (MILK OF MAGNESIA) suspension 30 mL  30 mL Oral Daily PRN Patrecia Pour, NP      . ondansetron (ZOFRAN-ODT) disintegrating tablet 4 mg  4 mg Oral Q8H PRN Patrecia Pour, NP      . traZODone (DESYREL) tablet 50 mg  50 mg Oral QHS PRN Patrecia Pour, NP   50 mg at 04/16/20 2126   PTA Medications: Medications Prior to Admission  Medication Sig Dispense Refill Last Dose  . ALPRAZolam (XANAX) 0.5 MG tablet Take 0.25 mg by mouth 3 (three) times daily as needed.   Past Week at Unknown time  . estradiol (ESTRACE) 0.1 MG/GM vaginal cream PLACE 1 GRAM IN VAGINA EVERY OTHER DAY FOR 7 DAYS THEN TWICE A WEEK THEREAFTER   Past Week at Unknown time  . EUTHYROX 112 MCG tablet TAKE 1 TABLET BY MOUTH ONCE DAILY BEFORE BREAKFAST . APPOINTMENT REQUIRED FOR  FUTURE REFILLS 30 tablet 0 Past Week at Unknown time  . Vitamin D, Cholecalciferol, 10 MCG (400 UNIT) TABS Take 800 Units by mouth daily.   Past Week at Unknown time    Musculoskeletal: Strength & Muscle Tone: within normal limits Gait & Station: normal Patient leans: N/A  Psychiatric Specialty Exam: Physical Exam Vitals and nursing note reviewed.  Constitutional:      Appearance: Normal appearance.  HENT:     Head: Normocephalic and atraumatic.  Cardiovascular:     Rate and Rhythm: Normal rate and regular rhythm.  Musculoskeletal:        General: Normal range of motion.     Cervical back: Normal range of motion.  Skin:    General: Skin is warm.  Neurological:     Mental Status: She is alert.     Review of Systems  Constitutional: Negative.   HENT: Negative for ear pain.        Left ear tingling/pressure  Eyes:       Let eye blurry  Respiratory: Negative.  Cardiovascular: Negative.   Gastrointestinal:       Stomach churning, qizzy  Endocrine: Negative.   Genitourinary: Negative.   Musculoskeletal: Positive for back pain and neck pain.  Skin: Negative.   Neurological: Negative.   Hematological: Negative.   Psychiatric/Behavioral: The patient is nervous/anxious.     Blood pressure 97/64, pulse 69, temperature 98.1 F (36.7 C), temperature source Oral, resp. rate 18, height 5' 7.5" (1.715 m), weight 68 kg, last menstrual period 01/02/2016, SpO2 99 %.Body mass index is 23.15 kg/m.  General Appearance: Casual and Fairly Groomed  Eye Contact:  Absent  Speech:  Clear and Coherent and Normal Rate  Volume:  Normal  Mood:  Anxious  Affect:  Congruent  Thought Process:  Coherent  Orientation:  Full (Time, Place, and Person)  Thought Content:  Logical  Suicidal Thoughts:  No  Homicidal Thoughts:  No  Memory:  Immediate;   Fair Recent;   Fair Remote;   Fair  Judgement:  Poor  Insight:  Fair  Psychomotor Activity:  Normal  Concentration:  Concentration: Fair and  Attention Span: Fair  Recall:  [employer] of Knowledge:  Fair  Language:  Good  Akathisia:  No  Handed:  Right  AIMS (if indicated):     Assets:  Communication Skills Desire for Improvement Housing Intimacy Vocational/Educational  ADL's:  Intact  Cognition:  WNL  Sleep:       Treatment Plan Summary: Daily contact with patient to assess and evaluate symptoms and progress in treatment and Medication management  Plan:  -Continue Celexa 20 mg po daily for depression/Anxiety -Gabapentin 100 mg po three times a day for anxiety/mood -Continue Trazodone 50 mg po at bed time as needed for sleep -Continue Hydroxyzine 25 mg po q 6 hours as needed for anxiety -Continue Synthroid 112 mcg  Po daily for hypothyroidism -Individual therapy daily regarding job issues and anxiety. Observation Level/Precautions:  15 minute checks  Laboratory:  UDS UA Need UA/UDS.  Rest of lab results wnl.  Psychotherapy:  Encouraged to participate  Medications:  See [facility]  Consultations:  NA  Discharge Concerns:  NA  Estimated LOS:3-5 DAYS  Other:     Physician Treatment Plan for Primary Diagnosis: Major depressive disorder, recurrent severe without psychotic features ([place]) Long Term Goal(s): Improvement in symptoms so as ready for discharge  Short Term Goals: Ability to identify changes in lifestyle to reduce recurrence of condition will improve, Ability to verbalize feelings will improve, Ability to disclose and discuss suicidal ideas, Ability to demonstrate self-control will improve, Ability to identify and develop effective coping behaviors will improve, Ability to maintain clinical measurements within normal limits will improve, Compliance with prescribed medications will improve and Ability to identify triggers associated with substance abuse/mental health issues will improve  Physician Treatment Plan for Secondary Diagnosis: Principal Problem:   Major depressive disorder, recurrent severe without  psychotic features ([place])  Long Term Goal(s): Improvement in symptoms so as ready for discharge  Short Term Goals: Ability to identify changes in lifestyle to reduce recurrence of condition will improve, Ability to verbalize feelings will improve, Ability to disclose and discuss suicidal ideas, Ability to demonstrate self-control will improve, Ability to identify and develop effective coping behaviors will improve, Ability to maintain clinical measurements within normal limits will improve, Compliance with prescribed medications will improve and Ability to identify triggers associated with substance abuse/mental health issues will improve  I certify that inpatient services furnished can reasonably be expected to improve the patient's condition.  Delfin Gant, NP 9/5/202110:38 AM

## 2020-04-18 MED ORDER — HYDROXYZINE HCL 25 MG PO TABS
25.0000 mg | ORAL_TABLET | Freq: Four times a day (QID) | ORAL | 0 refills | Status: DC | PRN
Start: 2020-04-18 — End: 2020-04-19

## 2020-04-18 MED ORDER — CITALOPRAM HYDROBROMIDE 20 MG PO TABS
20.0000 mg | ORAL_TABLET | Freq: Every day | ORAL | 0 refills | Status: DC
Start: 2020-04-19 — End: 2020-04-19

## 2020-04-18 MED ORDER — GABAPENTIN 100 MG PO CAPS: 100.0000 mg | ORAL_CAPSULE | Freq: Three times a day (TID) | ORAL | 0 refills | Status: DC

## 2020-04-18 MED ORDER — TRAZODONE HCL 50 MG PO TABS
50.0000 mg | ORAL_TABLET | Freq: Every evening | ORAL | 0 refills | Status: DC | PRN
Start: 2020-04-18 — End: 2020-04-19

## 2020-04-18 MED ORDER — ONDANSETRON 4 MG PO TBDP
4.0000 mg | ORAL_TABLET | Freq: Three times a day (TID) | ORAL | 0 refills | Status: DC | PRN
Start: 2020-04-18 — End: 2020-04-19

## 2020-04-18 NOTE — Plan of Care (Signed)
  Problem: Education: Goal: Ability to state activities that reduce stress will improve Outcome: Completed/Met   Problem: Coping: Goal: Ability to identify and develop effective coping behavior will improve Outcome: Completed/Met   Problem: Self-Concept: Goal: Ability to identify factors that promote anxiety will improve Outcome: Completed/Met Goal: Level of anxiety will decrease Outcome: Completed/Met Goal: Ability to modify response to factors that promote anxiety will improve Outcome: Completed/Met   Problem: Education: Goal: Utilization of techniques to improve thought processes will improve Outcome: Completed/Met Goal: Knowledge of the prescribed therapeutic regimen will improve Outcome: Completed/Met   Problem: Activity: Goal: Interest or engagement in leisure activities will improve Outcome: Completed/Met Goal: Imbalance in normal sleep/wake cycle will improve Outcome: Completed/Met   Problem: Coping: Goal: Coping ability will improve Outcome: Completed/Met Goal: Will verbalize feelings Outcome: Completed/Met   Problem: Health Behavior/Discharge Planning: Goal: Ability to make decisions will improve Outcome: Completed/Met Goal: Compliance with therapeutic regimen will improve Outcome: Completed/Met   Problem: Role Relationship: Goal: Will demonstrate positive changes in social behaviors and relationships Outcome: Completed/Met   Problem: Safety: Goal: Ability to disclose and discuss suicidal ideas will improve Outcome: Completed/Met Goal: Ability to identify and utilize support systems that promote safety will improve Outcome: Completed/Met   Problem: Self-Concept: Goal: Will verbalize positive feelings about self Outcome: Completed/Met Goal: Level of anxiety will decrease Outcome: Completed/Met   Problem: Education: Goal: Knowledge of [place] [facility] Education information/materials will improve Outcome: Completed/Met Goal: Emotional status will  improve Outcome: Completed/Met Goal: Mental status will improve Outcome: Completed/Met Goal: Verbalization of understanding the information provided will improve Outcome: Completed/Met   Problem: Activity: Goal: Interest or engagement in activities will improve Outcome: Completed/Met Goal: Sleeping patterns will improve Outcome: Completed/Met   Problem: Coping: Goal: Ability to verbalize frustrations and anger appropriately will improve Outcome: Completed/Met Goal: Ability to demonstrate self-control will improve Outcome: Completed/Met   Problem: Health Behavior/Discharge Planning: Goal: Identification of resources available to assist in meeting health care needs will improve Outcome: Completed/Met Goal: Compliance with treatment plan for underlying cause of condition will improve Outcome: Completed/Met   Problem: Physical Regulation: Goal: Ability to maintain clinical measurements within normal limits will improve Outcome: Completed/Met   Problem: Safety: Goal: Periods of time without injury will increase Outcome: Completed/Met   Problem: Activity: Goal: Will identify at least one activity in which they can participate Outcome: Completed/Met   Problem: Coping: Goal: Ability to identify and develop effective coping behavior will improve Outcome: Completed/Met Goal: Ability to interact with others will improve Outcome: Completed/Met Goal: Demonstration of participation in decision-making regarding own care will improve Outcome: Completed/Met Goal: Ability to use eye contact when communicating with others will improve Outcome: Completed/Met   Problem: Health Behavior/Discharge Planning: Goal: Identification of resources available to assist in meeting health care needs will improve Outcome: Completed/Met   Problem: Self-Concept: Goal: Will verbalize positive feelings about self Outcome: Completed/Met   Problem: Education: Goal: Ability to make informed decisions  regarding treatment will improve Outcome: Completed/Met   Problem: Coping: Goal: Coping ability will improve Outcome: Completed/Met   Problem: Health Behavior/Discharge Planning: Goal: Identification of resources available to assist in meeting health care needs will improve Outcome: Completed/Met   Problem: Medication: Goal: Compliance with prescribed medication regimen will improve Outcome: Completed/Met   Problem: Self-Concept: Goal: Ability to disclose and discuss suicidal ideas will improve Outcome: Completed/Met Goal: Will verbalize positive feelings about self Outcome: Completed/Met

## 2020-04-18 NOTE — [facility] Suicide Risk Assessment (Signed)
[place] INPATIENT:  Family/Significant Other Suicide Prevention Education  Suicide Prevention Education:  Education Completed; ex husband Pollie Meyer [phone],  (name of family member/significant other) has been identified by the patient as the family member/significant other with whom the patient will be residing, and identified as the person(s) who will aid the patient in the event of a mental health crisis (suicidal ideations/suicide attempt).  With written consent from the patient, the family member/significant other has been provided the following suicide prevention education, prior to the and/or following the discharge of the patient.  The suicide prevention education provided includes the following:  Suicide risk factors  Suicide prevention and interventions  National Suicide Hotline telephone number  [facility] assessment telephone number  [facility] Emergency Assistance [place] and/or Residential Mobile Crisis Unit telephone number  Request made of family/significant other to:  Remove weapons (e.g., guns, rifles, knives), all items previously/currently identified as safety concern.    Remove drugs/medications (over-the-counter, prescriptions, illicit drugs), all items previously/currently identified as a safety concern.  The family member/significant other verbalizes understanding of the suicide prevention education information provided.  The family member/significant other agrees to remove the items of safety concern listed above.  Husband confirmed that he can pick pt up on 04/18/20 at 1230pm. Is okay with referrals to psychiatrist and therapist.  Bethann Berkshire 04/18/2020, 9:57 AM

## 2020-04-18 NOTE — Progress Notes (Signed)
   04/18/20 0921  COVID-19 Daily Checkoff  Have you had a fever (temp > 37.80C/100F)  in the past 24 hours?  No  If you have had runny nose, nasal congestion, sneezing in the past 24 hours, has it worsened? No  COVID-19 EXPOSURE  Have you traveled outside the state in the past 14 days? No  Have you been in contact with someone with a confirmed diagnosis of COVID-19 or PUI in the past 14 days without wearing appropriate PPE? No  Have you been living in the same home as a person with confirmed diagnosis of COVID-19 or a PUI (household contact)? No  Have you been diagnosed with COVID-19? No

## 2020-04-18 NOTE — Progress Notes (Signed)
Discharge note: Patient discharged home per MD order. Patient received all her personal belongings from her room and unit. She denies any thoughts of self harm. Reviewed AVS/discharge instructions with patient and she indicates understanding. Patient left ambulatory with her husband and daughter.

## 2020-04-18 NOTE — Progress Notes (Signed)
  [facility] Adult Case Management Discharge Plan :  Will you be returning to the same living situation after discharge:  Yes,  ex husband Corene Cornea Bullins At discharge, do you have transportation home?: Yes,  yes ex husband Do you have the ability to pay for your medications: Yes,  reports she has [employer]  Release of information consent forms completed and in the chart;  Patient's signature needed at discharge.  Patient to Follow up at:  Follow-up Information    Biagio Borg, MD. Go on 04/19/2020.   Specialties: Internal Medicine, Radiology Why: Appointment is at 3:00pm Contact information: [place] [address] [place], [place]. Schedule an appointment as soon as possible for a visit.   Why: Please contact this provider for medication management services. Contact information: [place] [address] [phone]        [facility]. Schedule an appointment as soon as possible for a visit.   Why: Please contact this provider for medication management services. Contact information: [address] [place] [address]       Center, [place]. Schedule an appointment as soon as possible for a visit.   Specialty: Behavioral Health Why: Please contact this provider for medication management services. Contact information: [address] [place]  [address] [phone]               Next level of care provider has access to [place] and Suicide Prevention discussed: Yes,  with ex husband  Have you used any form of tobacco in the last 30 days? (Cigarettes, Smokeless Tobacco, Cigars, and/or Pipes): No  Has patient been referred to the Quitline?: N/A patient is not a smoker  Patient has been referred for addiction treatment: [place], [place] 04/18/2020, 10:04 AM

## 2020-04-18 NOTE — Discharge Summary (Signed)
Physician Discharge Summary Note  Patient:  [name] is an 49 y.o., female MRN:  [id number] DOB:  [birth year] Patient phone:  [phone] (home)  Patient address:   [place] [address],   Total Time spent with patient: Greater than 30 minutes  Date of Admission:  04/16/2020  Date of Discharge: 04-18-20  Reason for Admission: Worsening anxiety symptoms leading to panic attacks.  Principal Problem: Major depressive disorder, recurrent severe without psychotic features [facility])  Discharge Diagnoses: Principal Problem:   Major depressive disorder, recurrent severe without psychotic features ([place]) Active Problems:   Hypothyroidism   INSOMNIA   Hyperglycemia   Vitamin D deficiency   Anxiety disorder, unspecified  Past Psychiatric History: Major depressive disorder, recurrent  Past Medical History:  Past Medical History:  Diagnosis Date  . Abdominal pain, epigastric 06/19/2010  . Allergic rhinitis 07/09/2018  . Allergy   . Cancer [facility])    thyroid cancer   . DEPRESSION 05/02/2010  . HLD (hyperlipidemia) 07/09/2018  . HYPERTHYROIDISM 05/02/2010  . HYPOTHYROIDISM 05/02/2010  . INSOMNIA 05/02/2010  . NAUSEA 06/19/2010  . TRANSAMINASES, SERUM, ELEVATED 05/02/2010    Past Surgical History:  Procedure Laterality Date  . APPENDECTOMY    . CESAREAN SECTION     x 2  . CHOLECYSTECTOMY  2011  . LUMBAR LAMINECTOMY/DECOMPRESSION MICRODISCECTOMY N/A 01/13/2016   Procedure: MICRO LUMBAR DECOMPRESSION L5-S1, L4-L5;  Surgeon: Susa Day, MD;  Location: [facility];  Service: Orthopedics;  Laterality: N/A;  . s/p compound fx left femur s/p rod, now removed    . TUBAL LIGATION     Family History:  Family History  Problem Relation Age of Onset  . Heart disease Other   . Hypertension Other   . Hyperlipidemia Other   . Stroke Other   . Diabetes Other   . COPD Other   . Stroke Mother   . Depression Mother   . Alcohol abuse Mother   . Alcohol abuse Father   . Heart disease  Father   . Hyperlipidemia Father   . Hypertension Father   . Diabetes Father   . COPD Other   . Cancer Paternal Uncle        mets all over   . Breast cancer Other        M. Great grandmother   Family Psychiatric  History: See H&P  Social History:  Social History   Substance and Sexual Activity  Alcohol Use Yes  . Alcohol/week: 0.0 standard drinks   Comment: Occ- wine      Social History   Substance and Sexual Activity  Drug Use No    Social History   Socioeconomic History  . Marital status: Married    Spouse name: Not on file  . Number of children: 3  . Years of education: Not on file  . Highest education level: Not on file  Occupational History  . Occupation: IT consultant since 1999  Tobacco Use  . Smoking status: Never Smoker  . Smokeless tobacco: Never Used  Substance and Sexual Activity  . Alcohol use: Yes    Alcohol/week: 0.0 standard drinks    Comment: Occ- wine   . Drug use: No  . Sexual activity: Not on file  Other Topics Concern  . Not on file  Social History Narrative   3 biological children, 1 adopted   Social Determinants of Health   Financial Resource Strain:   . Difficulty of Paying Living Expenses: Not on file  Food Insecurity:   . Worried About Charity fundraiser in the Last Year: Not on file  . Ran Out of Food in the Last Year: Not on file  Transportation Needs:   . Lack of Transportation (Medical): Not on file  . Lack of Transportation (Non-Medical): Not on file  Physical Activity:   . Days of Exercise per Week: Not on file  . Minutes of Exercise per Session: Not on file  Stress:   . Feeling of Stress : Not on file  Social Connections:   . Frequency of Communication with Friends and Family: Not on file  . Frequency of Social Gatherings with Friends and Family: Not on file  . Attends Religious Services: Not on file  . Active Member of Clubs or Organizations: Not on file  . Attends Archivist  Meetings: Not on file  . Marital Status: Not on file   Hospital Course: (Per the provider's admission evaluation notes): Female admitted yesterday evening for severe anxiety/ Panic attacks.  Patient walked in to [facility] twice in 24 hours for same com plaint of anxiety.  Patient complains about neck pain and stomach discomfort.  She was seen  In July for almost same complaint and at there time CT head was done that came back negative.  This time she was suicidal on arrival but today denies so.  Patient states she does not know why she is having anxiety that paralyzes her.  She reports that she suddenly left her former job due to stress and long hours.  She started a new job that she likes but not paying enough money.  She is divorced but is working with ex husband to get back together.  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   She informs Probation officer she is anxious about this job -night shift.  She is  Worried about staying awake at night and she is also worried that if she quit working at all she may not have any income and may not be able to help her daughter.  She is worried she loose everything.  Patient admits this morning that she is worried about her moving from job to job and continuing to work at all.  She has a 57 years old daughter that will soon go to collage and without steady income she may not be able to help her.  She reports back pain which she plans to see a neurologist this coming Friday.  She falls asleep easily but wakes up after 4 hours sleep.  She reports fairly okay appetite and she eats two times a day with snacks.  We discussed adding a non habit forming anxiolytic like Hydroxyzine for anxiety.  Patient is in agreement and  we will continue to offer Trazodone for sleep, Celexa for depression and anxiety and Gabapentin for mood and anxiety.  We will consider Bipolar disorder for this patient due to her report of suddenly impulsively leaving her job  and considering leaving another job and worried about the new one.  She denies hx of Bipolar disorder in the family.  After evaluation of her presenting symptoms, [name] was recommended for mood stabilization treatments. The medication regimen for her presenting symptoms were discussed & with her consent initiated. She received, stabilized & was discharged on the medications as listed below on her discharge medication lists. She was also enrolled & participated in the group counseling sessions being offered & held  on this unit. She learned coping skills. She presented on this admission, other pre-existing medical conditions that required treatment & monitoring. She was resumed/discharged on all the medications for those health issues. She tolerated her treatment regimen without any adverse effects or reactions reported.   During the course of her hospitalization, the 15-minute checks were adequate to ensure Nethra's safety. Patient did not display any dangerous, violent or suicidal behavior on the unit.  She interacted with patients & staff appropriately. She participated appropriately in the group sessions/therapies. Her medications were addressed & adjusted to meet her needs. She was recommended for outpatient follow-up care & medication management upon discharge to assure her continuity of care.  At the time of discharge patient is not reporting any acute suicidal/homicidal ideations. She feels more confident about her self & mental health care. She currently denies any new issues or concerns. Education and supportive counseling provided throughout her hospital stay & upon discharge.   Today upon her discharge evaluation with the attending psychiatrist, Kynzlee shares she is doing well. She denies any other specific concerns. She is sleeping well. Her appetite is good. She denies other physical complaints. She denies AH/VH. She feels that her medications have been helpful & is in agreement to continue her  current treatment regimen as recommended. She was able to engage in safety planning including plan to return to [facility] or contact emergency services if she feels unable to maintain her own safety or the safety of others. Pt had no further questions, comments, or concerns. She left [facility] with all personal belongings in no apparent distress. Transportation per her ex-husband.  Physical Findings: AIMS: Facial and Oral Movements Muscles of Facial Expression: None, normal Lips and Perioral Area: None, normal Jaw: None, normal Tongue: None, normal,Extremity Movements Upper (arms, wrists, hands, fingers): None, normal Lower (legs, knees, ankles, toes): None, normal, Trunk Movements Neck, shoulders, hips: None, normal, Overall Severity Severity of abnormal movements (highest score from questions above): None, normal Incapacitation due to abnormal movements: None, normal Patient's awareness of abnormal movements (rate only patient's report): No Awareness, Dental Status Current problems with teeth and/or dentures?: No Does patient usually wear dentures?: No  CIWA:    COWS:     Musculoskeletal: Strength & Muscle Tone: within normal limits Gait & Station: normal Patient leans: Right  Psychiatric Specialty Exam: Physical Exam Vitals and nursing note reviewed.  HENT:     Head: Normocephalic.     Nose: Nose normal.     Mouth/Throat:     Pharynx: Oropharynx is clear.  Eyes:     Pupils: Pupils are equal, round, and reactive to light.  Cardiovascular:     Rate and Rhythm: Normal rate.     Pulses: Normal pulses.  Pulmonary:     Effort: Pulmonary effort is normal.  Genitourinary:    Comments: Deferred  Musculoskeletal:        General: Normal range of motion.     Cervical back: Normal range of motion.  Skin:    General: Skin is warm and dry.  Neurological:     Mental Status: She is alert and oriented to person, place, and time. Mental status is at baseline.     Review of Systems   Constitutional: Negative for chills, diaphoresis and fever.  HENT: Negative for congestion, rhinorrhea, sneezing and sore throat.   Eyes: Negative for discharge.  Respiratory: Negative for cough, chest tightness, shortness of breath and wheezing.   Cardiovascular: Negative for chest pain and palpitations.  Gastrointestinal: Negative  for diarrhea, nausea and vomiting.  Endocrine: Negative for cold intolerance.  Genitourinary: Negative for difficulty urinating.  Musculoskeletal: Negative for arthralgias and myalgias.  Skin: Negative.   Allergic/Immunologic: Negative for environmental allergies and food allergies.       Allergies: Sulfa drugs  Neurological: Negative for dizziness, tremors, seizures, syncope, facial asymmetry, speech difficulty, weakness, light-headedness, numbness and headaches.  Psychiatric/Behavioral: Positive for dysphoric mood (Stabilized with medication prior to discharge) and sleep disturbance (Stabilized with medication upon discharge). Negative for agitation, behavioral problems, confusion, decreased concentration, hallucinations, self-injury and suicidal ideas. The patient is not nervous/anxious (Stable upon discharge) and is not hyperactive.     Blood pressure 123/80, pulse 81, temperature 97.8 F (36.6 C), temperature source Oral, resp. rate 18, height 5' 7.5" (1.715 m), weight 68 kg, last menstrual period 01/02/2016, SpO2 99 %.Body mass index is 23.15 kg/m.  See Md's discharge SRA  Sleep:  Number of Hours: 4.75   Have you used any form of tobacco in the last 30 days? (Cigarettes, Smokeless Tobacco, Cigars, and/or Pipes): No  Has this patient used any form of tobacco in the last 30 days? (Cigarettes, Smokeless Tobacco, Cigars, and/or Pipes): N/A  Blood Alcohol level:  No results found for: [facility]  Metabolic Disorder Labs:  Lab Results  Component Value Date   HGBA1C 5.6 07/09/2018   No results found for: PROLACTIN Lab Results  Component Value Date   CHOL 166  11/30/2019   TRIG 53.0 11/30/2019   HDL 81.30 11/30/2019   CHOLHDL 2 11/30/2019   VLDL 10.6 11/30/2019   LDLCALC 74 11/30/2019   LDLCALC 87 07/09/2018   See Psychiatric Specialty Exam and Suicide Risk Assessment completed by Attending Physician prior to discharge.  Discharge destination:  Home  Is patient on multiple antipsychotic therapies at discharge:  No   Has Patient had three or more failed trials of antipsychotic monotherapy by history:  No  Recommended Plan for Multiple Antipsychotic Therapies: NA  Allergies as of 04/18/2020      Reactions   Sulfa Antibiotics Hives      Medication List    STOP taking these medications   ALPRAZolam 0.5 MG tablet Commonly known as: XANAX   estradiol 0.1 MG/GM vaginal cream Commonly known as: ESTRACE   Vitamin D (Cholecalciferol) 10 MCG (400 UNIT) Tabs     TAKE these medications     Indication  citalopram 20 MG tablet Commonly known as: CELEXA Take 1 tablet (20 mg total) by mouth daily. For depression Start taking on: April 19, 2020  Indication: Depression   Euthyrox 112 MCG tablet Generic drug: levothyroxine TAKE 1 TABLET BY MOUTH ONCE DAILY BEFORE BREAKFAST . APPOINTMENT REQUIRED FOR FUTURE REFILLS  Indication: Underactive Thyroid   gabapentin 100 MG capsule Commonly known as: NEURONTIN Take 1 capsule (100 mg total) by mouth 3 (three) times daily. For agitation  Indication: Agitation   hydrOXYzine 25 MG tablet Commonly known as: ATARAX/VISTARIL Take 1 tablet (25 mg total) by mouth every 6 (six) hours as needed for anxiety.  Indication: Feeling Anxious   ondansetron 4 MG disintegrating tablet Commonly known as: ZOFRAN-ODT Take 1 tablet (4 mg total) by mouth every 8 (eight) hours as needed for nausea or vomiting.  Indication: Nausea and Vomiting   traZODone 50 MG tablet Commonly known as: DESYREL Take 1 tablet (50 mg total) by mouth at bedtime as needed for sleep.  Indication: Trouble Sleeping        Follow-up Information    Biagio Borg, MD.  Go on 04/19/2020.   Specialties: Internal Medicine, Radiology Why: Appointment is at 3:00pm Contact information: [place]  [address] [place], [place]. Schedule an appointment as soon as possible for a visit.   Why: Please contact this provider for medication management services. Contact information: [place] [address] [phone]        [facility]. Schedule an appointment as soon as possible for a visit.   Why: Please contact this provider for medication management services. Contact information: [address] [place] [address]       Center, [place]. Schedule an appointment as soon as possible for a visit.   Specialty: Behavioral Health Why: Please contact this provider for medication management services. Contact information: [place] [address] [phone]              Follow-up recommendations: Activity:  As tolerated Diet: As recommended by your primary care doctor. Keep all scheduled follow-up appointments as recommended.   Comments: Prescriptions given at discharge.  Patient agreeable to plan.  Given opportunity to ask questions.  Appears to feel comfortable with discharge denies any current suicidal or homicidal thought. Patient is also instructed prior to discharge to: Take all medications as prescribed by his/her mental healthcare provider. Report any adverse effects and or reactions from the medicines to his/her outpatient provider promptly. Patient has been instructed & cautioned: To not engage in alcohol and or illegal drug use while on prescription medicines. In the event of worsening symptoms, patient is instructed to call the crisis hotline, 911 and or go to the nearest ED for appropriate evaluation and treatment of symptoms. To  follow-up with his/her primary care provider for your other medical issues, concerns and or health care needs.  Signed: Lindell Spar, NP, PMHNP, FNP-BC 04/18/2020, 10:11 AM

## 2020-04-18 NOTE — Progress Notes (Signed)
Adult Psychoeducational Group Note  Date:  04/18/2020 Time:  2:31 AM  Group Topic/Focus:  Wrap-Up Group:   The focus of this group is to help patients review their daily goal of treatment and discuss progress on daily workbooks.  Participation Level:  Active  Participation Quality:  Appropriate  Affect:  Appropriate  Cognitive:  Appropriate  Insight: Appropriate  Engagement in Group:  Engaged  Modes of Intervention:  Discussion  Additional Comments:  Pt attend wrap up group. Her day was a 8. The one positive thing that happened to her today she had a meeting and the groups were good for her. She talked to her son and family and this was a good conversation.  Lenice Llamas Long 04/18/2020, 2:31 AM

## 2020-04-18 NOTE — Progress Notes (Signed)
Recreation Therapy Notes  Date: 9.6.21 Time: 0930 Location: [address] Group Room  Group Topic: Stress Management  Goal Area(s) Addresses:  Patient will identify positive stress management techniques. Patient will identify benefits of using stress management post d/c.  Intervention: Stress Management  Activity:  Meditation. LRT played a meditation that focused on making choices.  Patients were to listen and follow along as meditation played to fully engage in activity.    Education:  Stress Management, Discharge Planning.   Education Outcome: Acknowledges Education  Clinical Observations/Feedback: Pt did not attend group activity.   Victorino Sparrow, LRT/CTRS         Ria Comment, Kianah Harries A 04/18/2020 11:04 AM

## 2020-04-18 NOTE — Tx Team (Signed)
Interdisciplinary Treatment and Diagnostic Plan Update  04/18/2020 Time of Session: 12:30PM [name] MRN: [id number]  Principal Diagnosis: Major depressive disorder, recurrent severe without psychotic features ([place])  Secondary Diagnoses: Principal Problem:   Major depressive disorder, recurrent severe without psychotic features ([place]) Active Problems:   Hypothyroidism   INSOMNIA   Hyperglycemia   Vitamin D deficiency   Anxiety disorder, unspecified   Current Medications:  Current Facility-Administered Medications  Medication Dose Route Frequency Provider Last Rate Last Admin  . acetaminophen (TYLENOL) tablet 650 mg  650 mg Oral Q6H PRN Patrecia Pour, NP   650 mg at 04/17/20 1522  . alum & mag hydroxide-simeth (MAALOX/MYLANTA) 200-200-20 MG/5ML suspension 30 mL  30 mL Oral Q4H PRN Patrecia Pour, NP      . citalopram (CELEXA) tablet 20 mg  20 mg Oral Daily Patrecia Pour, NP   20 mg at 04/18/20 8657  . gabapentin (NEURONTIN) capsule 100 mg  100 mg Oral TID Patrecia Pour, NP   100 mg at 04/18/20 1144  . hydrOXYzine (ATARAX/VISTARIL) tablet 25 mg  25 mg Oral Q6H PRN Charmaine Downs C, NP   25 mg at 04/17/20 2051  . levothyroxine (SYNTHROID) tablet 112 mcg  112 mcg Oral Q0600 Patrecia Pour, NP   112 mcg at 04/18/20 8469  . loratadine (CLARITIN) tablet 10 mg  10 mg Oral q1800 Sharma Covert, MD   10 mg at 04/17/20 1803  . magnesium hydroxide (MILK OF MAGNESIA) suspension 30 mL  30 mL Oral Daily PRN Patrecia Pour, NP      . ondansetron (ZOFRAN-ODT) disintegrating tablet 4 mg  4 mg Oral Q8H PRN Patrecia Pour, NP      . traZODone (DESYREL) tablet 50 mg  50 mg Oral QHS PRN Patrecia Pour, NP   50 mg at 04/17/20 2051   PTA Medications: Medications Prior to Admission  Medication Sig Dispense Refill Last Dose  . ALPRAZolam (XANAX) 0.5 MG tablet Take 0.25 mg by mouth 3 (three) times daily as needed.   Past Week at Unknown time  . estradiol (ESTRACE) 0.1 MG/GM vaginal  cream PLACE 1 GRAM IN VAGINA EVERY OTHER DAY FOR 7 DAYS THEN TWICE A WEEK THEREAFTER   Past Week at Unknown time  . EUTHYROX 112 MCG tablet TAKE 1 TABLET BY MOUTH ONCE DAILY BEFORE BREAKFAST . APPOINTMENT REQUIRED FOR FUTURE REFILLS 30 tablet 0 Past Week at Unknown time  . Vitamin D, Cholecalciferol, 10 MCG (400 UNIT) TABS Take 800 Units by mouth daily.   Past Week at Unknown time    Patient Stressors: Financial difficulties Health problems Occupational concerns  Patient Strengths: Average or above average intelligence Capable of independent living Motivation for treatment/growth Supportive family/friends Work skills  Treatment Modalities: Medication Management, Group therapy, Case management,  1 to 1 session with clinician, Psychoeducation, Recreational therapy.   Physician Treatment Plan for Primary Diagnosis: Major depressive disorder, recurrent severe without psychotic features ([place]) Long Term Goal(s): Improvement in symptoms so as ready for discharge Improvement in symptoms so as ready for discharge   Short Term Goals: Ability to identify changes in lifestyle to reduce recurrence of condition will improve Ability to verbalize feelings will improve Ability to disclose and discuss suicidal ideas Ability to demonstrate self-control will improve Ability to identify and develop effective coping behaviors will improve Ability to maintain clinical measurements within normal limits will improve Compliance with prescribed medications will improve Ability to identify triggers associated with substance abuse/mental health  issues will improve Ability to identify changes in lifestyle to reduce recurrence of condition will improve Ability to verbalize feelings will improve Ability to disclose and discuss suicidal ideas Ability to demonstrate self-control will improve Ability to identify and develop effective coping behaviors will improve Ability to maintain clinical measurements within  normal limits will improve Compliance with prescribed medications will improve Ability to identify triggers associated with substance abuse/mental health issues will improve  Medication Management: Evaluate patient's response, side effects, and tolerance of medication regimen.  Therapeutic Interventions: 1 to 1 sessions, Unit Group sessions and Medication administration.  Evaluation of Outcomes: Adequate for Discharge  Physician Treatment Plan for Secondary Diagnosis: Principal Problem:   Major depressive disorder, recurrent severe without psychotic features ([place]) Active Problems:   Hypothyroidism   INSOMNIA   Hyperglycemia   Vitamin D deficiency   Anxiety disorder, unspecified  Long Term Goal(s): Improvement in symptoms so as ready for discharge Improvement in symptoms so as ready for discharge   Short Term Goals: Ability to identify changes in lifestyle to reduce recurrence of condition will improve Ability to verbalize feelings will improve Ability to disclose and discuss suicidal ideas Ability to demonstrate self-control will improve Ability to identify and develop effective coping behaviors will improve Ability to maintain clinical measurements within normal limits will improve Compliance with prescribed medications will improve Ability to identify triggers associated with substance abuse/mental health issues will improve Ability to identify changes in lifestyle to reduce recurrence of condition will improve Ability to verbalize feelings will improve Ability to disclose and discuss suicidal ideas Ability to demonstrate self-control will improve Ability to identify and develop effective coping behaviors will improve Ability to maintain clinical measurements within normal limits will improve Compliance with prescribed medications will improve Ability to identify triggers associated with substance abuse/mental health issues will improve     Medication Management: Evaluate  patient's response, side effects, and tolerance of medication regimen.  Therapeutic Interventions: 1 to 1 sessions, Unit Group sessions and Medication administration.  Evaluation of Outcomes: Adequate for Discharge   RN Treatment Plan for Primary Diagnosis: Major depressive disorder, recurrent severe without psychotic features ([place]) Long Term Goal(s): Knowledge of disease and therapeutic regimen to maintain health will improve  Short Term Goals: Ability to remain free from injury will improve, Ability to identify and develop effective coping behaviors will improve and Compliance with prescribed medications will improve  Medication Management: RN will administer medications as ordered by provider, will assess and evaluate patient's response and provide education to patient for prescribed medication. RN will report any adverse and/or side effects to prescribing provider.  Therapeutic Interventions: 1 on 1 counseling sessions, Psychoeducation, Medication administration, Evaluate responses to treatment, Monitor vital signs and CBGs as ordered, Perform/monitor CIWA, COWS, AIMS and Fall Risk screenings as ordered, Perform wound care treatments as ordered.  Evaluation of Outcomes: Adequate for Discharge   LCSW Treatment Plan for Primary Diagnosis: Major depressive disorder, recurrent severe without psychotic features ([place]) Long Term Goal(s): Safe transition to appropriate next level of care at discharge, Engage patient in therapeutic group addressing interpersonal concerns.  Short Term Goals: Engage patient in aftercare planning with referrals and resources, Increase social support, Identify triggers associated with mental health/substance abuse issues and Increase skills for wellness and recovery  Therapeutic Interventions: Assess for all discharge needs, 1 to 1 time with Social worker, Explore available resources and support systems, Assess for adequacy in community support network, Educate family  and significant other(s) on suicide prevention, Complete Psychosocial Assessment,  Interpersonal group therapy.  Evaluation of Outcomes: Adequate for Discharge   Progress in Treatment: Attending groups: Yes. Participating in groups: Yes. Taking medication as prescribed: Yes. Toleration medication: Yes. Family/Significant other contact made: Yes, individual(s) contacted:  ex-husband Patient understands diagnosis: Yes. Discussing patient identified problems/goals with staff: Yes. Medical problems stabilized or resolved: Yes. Denies suicidal/homicidal ideation: Yes. Issues/concerns per patient self-inventory: No.  New problem(s) identified: No, Describe:  none  New Short Term/Long Term Goal(s): medication stabilization, elimination of SI thoughts, development of comprehensive mental wellness plan.   Patient Goals:  Did not attend  Discharge Plan or Barriers:   Reason for Continuation of Hospitalization: Medication stabilization  Estimated Length of Stay: Adequate for discharge  Attendees: Patient: Did not attend 04/18/2020   Physician:  04/18/2020   Nursing:  04/18/2020   RN Care Manager: 04/18/2020   Social Worker: Darletta Moll, LCSW 04/18/2020   Recreational Therapist:  04/18/2020  Other:  04/18/2020   Other:  04/18/2020   Other: 04/18/2020     Scribe for Treatment Team: Vassie Moselle, [place] 04/18/2020 12:34 PM

## 2020-04-18 NOTE — [facility] Suicide Risk Assessment (Signed)
[facility] Discharge Suicide Risk Assessment   Principal Problem: Major depressive disorder, recurrent severe without psychotic features ([place]) Discharge Diagnoses: Principal Problem:   Major depressive disorder, recurrent severe without psychotic features ([place]) Active Problems:   Hypothyroidism   INSOMNIA   Hyperglycemia   Vitamin D deficiency   Anxiety disorder, unspecified   Total Time spent with patient: 30 minutes  Musculoskeletal: Strength & Muscle Tone: within normal limits Gait & Station: normal Patient leans: N/A  Psychiatric Specialty Exam: Review of Systems  All other systems reviewed and are negative.   Blood pressure 123/80, pulse 81, temperature 97.8 F (36.6 C), temperature source Oral, resp. rate 18, height 5' 7.5" (1.715 m), weight 68 kg, last menstrual period 01/02/2016, SpO2 99 %.Body mass index is 23.15 kg/m.  General Appearance: Casual  Eye Contact::  Good  Speech:  Normal Rate409  Volume:  Increased  Mood:  Anxious  Affect:  Congruent  Thought Process:  Coherent and Descriptions of Associations: Intact  Orientation:  Full (Time, Place, and Person)  Thought Content:  Logical  Suicidal Thoughts:  No  Homicidal Thoughts:  No  Memory:  Immediate;   Good Recent;   Good Remote;   Good  Judgement:  Intact  Insight:  Fair  Psychomotor Activity:  Increased  Concentration:  Fair  Recall:  Good  Fund of Knowledge:Good  Language: Good  Akathisia:  Negative  Handed:  Right  AIMS (if indicated):     Assets:  Desire for Improvement Financial Resources/Insurance Housing Resilience Social Support Talents/Skills Transportation  Sleep:  Number of Hours: 4.75  Cognition: WNL  ADL's:  Intact   Mental Status Per Nursing Assessment::   On Admission:  Suicidal ideation indicated by others, Self-harm behaviors  Demographic Factors:  Caucasian  Loss Factors: Decline in physical health  Historical Factors: Impulsivity  Risk Reduction Factors:   Sense of  responsibility to family, Living with another person, especially a relative and Positive social support  Continued Clinical Symptoms:  Severe Anxiety and/or Agitation Panic Attacks  Cognitive Features That Contribute To Risk:  None    Suicide Risk:  Minimal: No identifiable suicidal ideation.  Patients presenting with no risk factors but with morbid ruminations; may be classified as minimal risk based on the severity of the depressive symptoms   Follow-up Information    Biagio Borg, MD. Go on 04/19/2020.   Specialties: Internal Medicine, Radiology Why: Appointment is at 3:00pm Contact information: [place] [address] [phone]               Plan Of Care/Follow-up recommendations:  Activity:  ad lib  Sharma Covert, MD 04/18/2020, 9:36 AM

## 2020-04-19 ENCOUNTER — Ambulatory Visit: Payer: BC Managed Care – PPO | Admitting: Internal Medicine

## 2020-04-19 ENCOUNTER — Other Ambulatory Visit: Payer: Self-pay

## 2020-04-19 ENCOUNTER — Encounter: Payer: Self-pay | Admitting: Internal Medicine

## 2020-04-19 VITALS — BP 110/70 | HR 75 | Temp 98.6°F | Ht 67.5 in | Wt 155.0 lb

## 2020-04-19 DIAGNOSIS — R739 Hyperglycemia, unspecified: Secondary | ICD-10-CM

## 2020-04-19 DIAGNOSIS — E039 Hypothyroidism, unspecified: Secondary | ICD-10-CM | POA: Diagnosis not present

## 2020-04-19 DIAGNOSIS — G500 Trigeminal neuralgia: Secondary | ICD-10-CM | POA: Diagnosis not present

## 2020-04-19 DIAGNOSIS — F419 Anxiety disorder, unspecified: Secondary | ICD-10-CM

## 2020-04-19 MED ORDER — LEVOTHYROXINE SODIUM 112 MCG PO TABS
ORAL_TABLET | ORAL | 3 refills | Status: DC
Start: 2020-04-19 — End: 2021-04-25

## 2020-04-19 MED ORDER — CITALOPRAM HYDROBROMIDE 20 MG PO TABS
20.0000 mg | ORAL_TABLET | Freq: Every day | ORAL | 3 refills | Status: DC
Start: 2020-04-19 — End: 2021-12-29

## 2020-04-19 MED ORDER — TRAZODONE HCL 50 MG PO TABS
50.0000 mg | ORAL_TABLET | Freq: Every evening | ORAL | 1 refills | Status: DC | PRN
Start: 2020-04-19 — End: 2021-06-07

## 2020-04-19 MED ORDER — ONDANSETRON 4 MG PO TBDP
4.0000 mg | ORAL_TABLET | Freq: Three times a day (TID) | ORAL | 1 refills | Status: DC | PRN
Start: 2020-04-19 — End: 2021-06-07

## 2020-04-19 MED ORDER — HYDROXYZINE HCL 25 MG PO TABS
25.0000 mg | ORAL_TABLET | Freq: Four times a day (QID) | ORAL | 3 refills | Status: DC | PRN
Start: 2020-04-19 — End: 2021-12-29

## 2020-04-19 MED ORDER — GABAPENTIN 100 MG PO CAPS
100.0000 mg | ORAL_CAPSULE | Freq: Three times a day (TID) | ORAL | 1 refills | Status: DC
Start: 2020-04-19 — End: 2020-04-21

## 2020-04-19 NOTE — Progress Notes (Signed)
Subjective:    Patient ID: [name], female    DOB: 1971/02/26, 49 y.o.   MRN: [id number]  HPI  Here with c/o several weeks of left facial pain and vague swelling, now having seen multiple providers and tx not working including antibx, pain pills. Has been devastating for her causing severe anxiety even leading to a psychiatric hospns with panic.  Pt denies chest pain, increased sob or doe, wheezing, orthopnea, PND, increased LE swelling, palpitations, dizziness or syncope.   Pt denies polydipsia, polyuria   Pt denies fever, wt loss, night sweats, loss of appetite, or other constitutional symptoms Past Medical History:  Diagnosis Date  . Abdominal pain, epigastric 06/19/2010  . Allergic rhinitis 07/09/2018  . Allergy   . Cancer [facility])    thyroid cancer   . DEPRESSION 05/02/2010  . HLD (hyperlipidemia) 07/09/2018  . HYPERTHYROIDISM 05/02/2010  . HYPOTHYROIDISM 05/02/2010  . INSOMNIA 05/02/2010  . NAUSEA 06/19/2010  . TRANSAMINASES, SERUM, ELEVATED 05/02/2010   Past Surgical History:  Procedure Laterality Date  . APPENDECTOMY    . CESAREAN SECTION     x 2  . CHOLECYSTECTOMY  2011  . LUMBAR LAMINECTOMY/DECOMPRESSION MICRODISCECTOMY N/A 01/13/2016   Procedure: MICRO LUMBAR DECOMPRESSION L5-S1, L4-L5;  Surgeon: Susa Day, MD;  Location: [facility]S;  Service: Orthopedics;  Laterality: N/A;  . s/p compound fx left femur s/p rod, now removed    . TUBAL LIGATION      reports that she has never smoked. She has never used smokeless tobacco. She reports current alcohol use. She reports that she does not use drugs. family history includes Alcohol abuse in her father and mother; Breast cancer in an other family member; COPD in some other family members; Cancer in her paternal uncle; Depression in her mother; Diabetes in her father and another family member; Heart disease in her father and another family member; Hyperlipidemia in her father and another family member; Hypertension in her father and  another family member; Stroke in her mother and another family member. Allergies  Allergen Reactions  . Sulfa Antibiotics Hives   No current outpatient medications on file prior to visit.   No current facility-administered medications on file prior to visit.   Review of Systems All otherwise neg per pt    Objective:   Physical Exam BP 110/70 (BP Location: Left Arm, Patient Position: Sitting, Cuff Size: Large)   Pulse 75   Temp 98.6 F (37 C) (Oral)   Ht 5' 7.5" (1.715 m)   Wt 155 lb (70.3 kg)   LMP 01/02/2016   SpO2 96%   BMI 23.92 kg/m  VS noted,  Constitutional: Pt appears in NAD HENT: Head: NCAT.  Right Ear: External ear normal.  Left Ear: External ear normal.  Eyes: . Pupils are equal, round, and reactive to light. Conjunctivae and EOM are normal Nose: without d/c or deformity Left face with vague nondiscrete swelling tenderness Neck: Neck supple. Gross normal ROM Cardiovascular: Normal rate and regular rhythm.   Pulmonary/Chest: Effort normal and breath sounds without rales or wheezing.  Neurological: Pt is alert. At baseline orientation, motor grossly intact Skin: Skin is warm. No rashes, other new lesions, no LE edema Psychiatric: Pt behavior is normal without agitation  All otherwise neg per pt Lab Results  Component Value Date   WBC 6.2 04/16/2020   HGB 14.6 04/16/2020   HCT 44.8 04/16/2020   PLT 299 04/16/2020   GLUCOSE 84 04/16/2020   CHOL 166 11/30/2019   TRIG 53.0  11/30/2019   HDL 81.30 11/30/2019   LDLCALC 74 11/30/2019   ALT 19 11/30/2019   AST 21 11/30/2019   NA 141 04/16/2020   K 4.1 04/16/2020   CL 103 04/16/2020   CREATININE 0.72 04/16/2020   BUN 23 (H) 04/16/2020   CO2 29 04/16/2020   TSH 0.514 04/16/2020   HGBA1C 5.6 07/09/2018      Assessment & Plan:

## 2020-04-19 NOTE — Patient Instructions (Signed)
You will be contacted regarding the referral for: Neurosurgury for probable Trigeminal Neuralgia  Please continue all other medications as before, and refills have been done if requested.  Please have the pharmacy call with any other refills you may need.  Please continue your efforts at being more active, low cholesterol diet, and weight control.  Please keep your appointments with your specialists as you may have planned

## 2020-04-21 ENCOUNTER — Telehealth: Payer: Self-pay | Admitting: Internal Medicine

## 2020-04-21 MED ORDER — GABAPENTIN 100 MG PO CAPS
200.0000 mg | ORAL_CAPSULE | Freq: Three times a day (TID) | ORAL | 1 refills | Status: DC
Start: 2020-04-21 — End: 2020-09-15

## 2020-04-21 NOTE — Telephone Encounter (Signed)
° ° ° ° °  1. Name of Medication: gabapentin (NEURONTIN) 100 MG capsule  2. How are you currently taking this medication (dosage and times per day)? AS WRITTEN  3. Are you having a reaction (difficulty breathing--STAT)? NO  4. What is your medication issue? Patient requesting medication dosage be increased due to jaw pain

## 2020-04-21 NOTE — Telephone Encounter (Signed)
Ok done erx to [facility]

## 2020-04-21 NOTE — Telephone Encounter (Signed)
Sent to Dr. John. 

## 2020-04-22 DIAGNOSIS — M545 Low back pain: Secondary | ICD-10-CM | POA: Diagnosis not present

## 2020-04-22 DIAGNOSIS — M542 Cervicalgia: Secondary | ICD-10-CM | POA: Diagnosis not present

## 2020-04-24 ENCOUNTER — Encounter: Payer: Self-pay | Admitting: Internal Medicine

## 2020-04-24 DIAGNOSIS — G500 Trigeminal neuralgia: Secondary | ICD-10-CM | POA: Insufficient documentation

## 2020-04-24 NOTE — Assessment & Plan Note (Signed)
Sadly I suspect her pain lead to panic attacks and hospn, for refill celexa 20 qd

## 2020-04-24 NOTE — Assessment & Plan Note (Signed)
stable overall by history and exam, recent data reviewed with pt, and pt to continue medical treatment as before,  to f/u any worsening symptoms or concerns  

## 2020-04-24 NOTE — Assessment & Plan Note (Addendum)
Has left facial pain with high suspicion for unrecognized trigeminal neuralgia, for gabapentin 100 tid and increased dose in 1 wk if tolerates ok  I spent 31 minutes in preparing to see the patient by review of recent labs, imaging and procedures, obtaining and reviewing separately obtained history, communicating with the patient and family or caregiver, ordering medications, tests or procedures, and documenting clinical information in the EHR including the differential Dx, treatment, and any further evaluation and other management of trigeminal neuralgia, anxiety, hyperglycemia, hypothryoidism

## 2020-04-27 ENCOUNTER — Encounter: Payer: Self-pay | Admitting: Neurology

## 2020-04-27 ENCOUNTER — Ambulatory Visit: Payer: BC Managed Care – PPO | Admitting: Neurology

## 2020-04-27 DIAGNOSIS — M5412 Radiculopathy, cervical region: Secondary | ICD-10-CM | POA: Diagnosis not present

## 2020-04-27 DIAGNOSIS — R519 Headache, unspecified: Secondary | ICD-10-CM | POA: Diagnosis not present

## 2020-05-12 DIAGNOSIS — M50221 Other cervical disc displacement at C4-C5 level: Secondary | ICD-10-CM | POA: Diagnosis not present

## 2020-05-12 DIAGNOSIS — M5412 Radiculopathy, cervical region: Secondary | ICD-10-CM | POA: Diagnosis not present

## 2020-05-12 DIAGNOSIS — M50222 Other cervical disc displacement at C5-C6 level: Secondary | ICD-10-CM | POA: Diagnosis not present

## 2020-05-19 DIAGNOSIS — R519 Headache, unspecified: Secondary | ICD-10-CM | POA: Diagnosis not present

## 2020-05-24 DIAGNOSIS — M545 Low back pain, unspecified: Secondary | ICD-10-CM | POA: Diagnosis not present

## 2020-05-24 DIAGNOSIS — M542 Cervicalgia: Secondary | ICD-10-CM | POA: Insufficient documentation

## 2020-06-07 DIAGNOSIS — M545 Low back pain, unspecified: Secondary | ICD-10-CM | POA: Diagnosis not present

## 2020-06-07 DIAGNOSIS — M5136 Other intervertebral disc degeneration, lumbar region: Secondary | ICD-10-CM | POA: Diagnosis not present

## 2020-06-07 DIAGNOSIS — M542 Cervicalgia: Secondary | ICD-10-CM | POA: Diagnosis not present

## 2020-09-15 ENCOUNTER — Other Ambulatory Visit: Payer: Self-pay | Admitting: Internal Medicine

## 2021-04-24 ENCOUNTER — Other Ambulatory Visit: Payer: Self-pay | Admitting: Internal Medicine

## 2021-04-24 NOTE — Telephone Encounter (Signed)
Please refill as per office routine med refill policy (all routine meds to be refilled for 3 mo or monthly (per pt preference) up to one year from last visit, then month to month grace period for 3 mo, then further med refills will have to be denied) ? ?

## 2021-04-25 DIAGNOSIS — E538 Deficiency of other specified B group vitamins: Secondary | ICD-10-CM | POA: Insufficient documentation

## 2021-04-26 DIAGNOSIS — R102 Pelvic and perineal pain: Secondary | ICD-10-CM | POA: Diagnosis not present

## 2021-04-26 DIAGNOSIS — Z01419 Encounter for gynecological examination (general) (routine) without abnormal findings: Secondary | ICD-10-CM | POA: Diagnosis not present

## 2021-04-26 DIAGNOSIS — N941 Unspecified dyspareunia: Secondary | ICD-10-CM | POA: Diagnosis not present

## 2021-04-26 DIAGNOSIS — Z6824 Body mass index (BMI) 24.0-24.9, adult: Secondary | ICD-10-CM | POA: Diagnosis not present

## 2021-04-26 DIAGNOSIS — N898 Other specified noninflammatory disorders of vagina: Secondary | ICD-10-CM | POA: Diagnosis not present

## 2021-04-26 DIAGNOSIS — Z1231 Encounter for screening mammogram for malignant neoplasm of breast: Secondary | ICD-10-CM | POA: Diagnosis not present

## 2021-05-16 ENCOUNTER — Telehealth: Payer: Self-pay | Admitting: Internal Medicine

## 2021-05-16 DIAGNOSIS — E538 Deficiency of other specified B group vitamins: Secondary | ICD-10-CM

## 2021-05-16 DIAGNOSIS — R739 Hyperglycemia, unspecified: Secondary | ICD-10-CM

## 2021-05-16 DIAGNOSIS — E7800 Pure hypercholesterolemia, unspecified: Secondary | ICD-10-CM

## 2021-05-16 DIAGNOSIS — E559 Vitamin D deficiency, unspecified: Secondary | ICD-10-CM

## 2021-05-16 NOTE — Telephone Encounter (Signed)
Patient has cpe scheduled for 10/26.Marland Kitchen would like for provider to place lab orders so she can have those done before visit

## 2021-05-16 NOTE — Telephone Encounter (Signed)
[place] lab orders done

## 2021-05-17 NOTE — Telephone Encounter (Signed)
Patient notified

## 2021-05-23 DIAGNOSIS — R6882 Decreased libido: Secondary | ICD-10-CM | POA: Diagnosis not present

## 2021-05-23 DIAGNOSIS — N941 Unspecified dyspareunia: Secondary | ICD-10-CM | POA: Diagnosis not present

## 2021-05-23 DIAGNOSIS — R102 Pelvic and perineal pain: Secondary | ICD-10-CM | POA: Diagnosis not present

## 2021-05-26 ENCOUNTER — Other Ambulatory Visit: Payer: Self-pay | Admitting: Internal Medicine

## 2021-05-26 NOTE — Telephone Encounter (Signed)
Please refill as per office routine med refill policy (all routine meds to be refilled for 3 mo or monthly (per pt preference) up to one year from last visit, then month to month grace period for 3 mo, then further med refills will have to be denied) ? ?

## 2021-06-05 ENCOUNTER — Other Ambulatory Visit (INDEPENDENT_AMBULATORY_CARE_PROVIDER_SITE_OTHER): Payer: Self-pay

## 2021-06-05 DIAGNOSIS — E7800 Pure hypercholesterolemia, unspecified: Secondary | ICD-10-CM

## 2021-06-05 DIAGNOSIS — E559 Vitamin D deficiency, unspecified: Secondary | ICD-10-CM

## 2021-06-05 DIAGNOSIS — R739 Hyperglycemia, unspecified: Secondary | ICD-10-CM | POA: Diagnosis not present

## 2021-06-05 DIAGNOSIS — E538 Deficiency of other specified B group vitamins: Secondary | ICD-10-CM

## 2021-06-05 LAB — HEPATIC FUNCTION PANEL
ALT: 21 U/L (ref 0–35)
AST: 18 U/L (ref 0–37)
Albumin: 4.4 g/dL (ref 3.5–5.2)
Alkaline Phosphatase: 82 U/L (ref 39–117)
Bilirubin, Direct: 0.1 mg/dL (ref 0.0–0.3)
Total Bilirubin: 0.7 mg/dL (ref 0.2–1.2)
Total Protein: 7.6 g/dL (ref 6.0–8.3)

## 2021-06-05 LAB — VITAMIN D 25 HYDROXY (VIT D DEFICIENCY, FRACTURES): VITD: 17.52 ng/mL — ABNORMAL LOW (ref 30.00–100.00)

## 2021-06-05 LAB — CBC WITH DIFFERENTIAL/PLATELET
Basophils Absolute: 0 10*3/uL (ref 0.0–0.1)
Basophils Relative: 0.9 % (ref 0.0–3.0)
Eosinophils Absolute: 0.1 10*3/uL (ref 0.0–0.7)
Eosinophils Relative: 3.6 % (ref 0.0–5.0)
HCT: 40.8 % (ref 36.0–46.0)
Hemoglobin: 13.6 g/dL (ref 12.0–15.0)
Lymphocytes Relative: 42.7 % (ref 12.0–46.0)
Lymphs Abs: 1.3 10*3/uL (ref 0.7–4.0)
MCHC: 33.4 g/dL (ref 30.0–36.0)
MCV: 95.2 fl (ref 78.0–100.0)
Monocytes Absolute: 0.2 10*3/uL (ref 0.1–1.0)
Monocytes Relative: 7.7 % (ref 3.0–12.0)
Neutro Abs: 1.4 10*3/uL (ref 1.4–7.7)
Neutrophils Relative %: 45.1 % (ref 43.0–77.0)
Platelets: 215 10*3/uL (ref 150.0–400.0)
RBC: 4.28 Mil/uL (ref 3.87–5.11)
RDW: 14.2 % (ref 11.5–15.5)
WBC: 3.1 10*3/uL — ABNORMAL LOW (ref 4.0–10.5)

## 2021-06-05 LAB — BASIC METABOLIC PANEL
BUN: 19 mg/dL (ref 6–23)
CO2: 25 mEq/L (ref 19–32)
Calcium: 9.6 mg/dL (ref 8.4–10.5)
Chloride: 103 mEq/L (ref 96–112)
Creatinine, Ser: 0.73 mg/dL (ref 0.40–1.20)
GFR: 95.74 mL/min (ref >60.00)
Glucose, Bld: 93 mg/dL (ref 70–99)
Potassium: 4.2 mEq/L (ref 3.5–5.1)
Sodium: 137 mEq/L (ref 135–145)

## 2021-06-05 LAB — URINALYSIS, ROUTINE W REFLEX MICROSCOPIC
Bilirubin Urine: NEGATIVE
Hgb urine dipstick: NEGATIVE
Ketones, ur: NEGATIVE
Nitrite: NEGATIVE
Specific Gravity, Urine: 1.025 (ref 1.000–1.030)
Total Protein, Urine: NEGATIVE
Urine Glucose: NEGATIVE
Urobilinogen, UA: 0.2 (ref 0.0–1.0)
pH: 6 (ref 5.0–8.0)

## 2021-06-05 LAB — VITAMIN B12: Vitamin B-12: 302 pg/mL (ref 211–911)

## 2021-06-05 LAB — LIPID PANEL
Cholesterol: 184 mg/dL (ref 0–200)
HDL: 74.4 mg/dL (ref >39.00)
LDL Cholesterol: 98 mg/dL (ref 0–99)
NonHDL: 110.05
Total CHOL/HDL Ratio: 2
Triglycerides: 61 mg/dL (ref 0.0–149.0)
VLDL: 12.2 mg/dL (ref 0.0–40.0)

## 2021-06-05 LAB — HEMOGLOBIN A1C: Hgb A1c MFr Bld: 5.5 % (ref 4.6–6.5)

## 2021-06-05 LAB — TSH: TSH: 3.15 u[IU]/mL (ref 0.35–5.50)

## 2021-06-07 ENCOUNTER — Encounter: Payer: Self-pay | Admitting: Internal Medicine

## 2021-06-07 ENCOUNTER — Other Ambulatory Visit: Payer: Self-pay

## 2021-06-07 ENCOUNTER — Ambulatory Visit (INDEPENDENT_AMBULATORY_CARE_PROVIDER_SITE_OTHER): Payer: BC Managed Care – PPO | Admitting: Internal Medicine

## 2021-06-07 VITALS — BP 108/60 | HR 64 | Ht 67.5 in | Wt 178.0 lb

## 2021-06-07 DIAGNOSIS — M21611 Bunion of right foot: Secondary | ICD-10-CM | POA: Insufficient documentation

## 2021-06-07 DIAGNOSIS — E559 Vitamin D deficiency, unspecified: Secondary | ICD-10-CM | POA: Diagnosis not present

## 2021-06-07 DIAGNOSIS — R350 Frequency of micturition: Secondary | ICD-10-CM

## 2021-06-07 DIAGNOSIS — R739 Hyperglycemia, unspecified: Secondary | ICD-10-CM | POA: Diagnosis not present

## 2021-06-07 DIAGNOSIS — R8290 Unspecified abnormal findings in urine: Secondary | ICD-10-CM | POA: Insufficient documentation

## 2021-06-07 DIAGNOSIS — Z0001 Encounter for general adult medical examination with abnormal findings: Secondary | ICD-10-CM | POA: Insufficient documentation

## 2021-06-07 DIAGNOSIS — E039 Hypothyroidism, unspecified: Secondary | ICD-10-CM

## 2021-06-07 MED ORDER — LEVOTHYROXINE SODIUM 112 MCG PO TABS: ORAL_TABLET | ORAL | 3 refills | Status: DC

## 2021-06-07 MED ORDER — CEPHALEXIN 500 MG PO CAPS: 500.0000 mg | ORAL_CAPSULE | Freq: Three times a day (TID) | ORAL | 0 refills | Status: AC

## 2021-06-07 NOTE — Progress Notes (Signed)
Patient ID: [name], female   DOB: Jul 15, 1971, 50 y.o.   MRN: [id number]         Chief Complaint:: wellness exam and low vit d, urinary frequency, hyperglycemia, depression       HPI:  [name] is a 50 y.o. female here for wellness exam; declines covid booster, shingrix, flu shot, and plans to see gyn herself for pap/mammogram, o/w up to date                        Also Pt denies chest pain, increased sob or doe, wheezing, orthopnea, PND, increased LE swelling, palpitations, dizziness or syncope.   Pt denies polydipsia, polyuria, or new focal neuro s/s  Pt denies fever, wt loss, night sweats, loss of appetite, or other constitutional symptoms, but has 3 days onset urinary frequency and midl discomfort, Denies urinary symptoms such as urgency, flank pain, hematuria or n/v, fever, chills.   Not taking Vit  d.     Wt Readings from Last 3 Encounters:  06/07/21 178 lb (80.7 kg)  04/19/20 155 lb (70.3 kg)  02/29/20 159 lb (72.1 kg)   BP Readings from Last 3 Encounters:  06/07/21 108/60  04/19/20 110/70  02/29/20 130/60   Immunization History  Administered Date(s) Administered   Influenza Split 04/26/2011   Influenza Whole 05/02/2010   Influenza,inj,Quad PF,6+ Mos 06/07/2015, 05/31/2017, 07/09/2018, 06/25/2019   PFIZER(Purple Top)SARS-COV-2 Vaccination 04/22/2020   Td 04/13/2004   Tdap 11/26/2013   There are no preventive care reminders to display for this patient.     Past Medical History:  Diagnosis Date   Abdominal pain, epigastric 06/19/2010   Allergic rhinitis 07/09/2018   Allergy    Cancer ([place])    thyroid cancer    DEPRESSION 05/02/2010   HLD (hyperlipidemia) 07/09/2018   HYPERTHYROIDISM 05/02/2010   HYPOTHYROIDISM 05/02/2010   INSOMNIA 05/02/2010   NAUSEA 06/19/2010   TRANSAMINASES, SERUM, ELEVATED 05/02/2010   Past Surgical History:  Procedure Laterality Date   APPENDECTOMY     CESAREAN SECTION     x 2   CHOLECYSTECTOMY  2011   LUMBAR  LAMINECTOMY/DECOMPRESSION MICRODISCECTOMY N/A 01/13/2016   Procedure: MICRO LUMBAR DECOMPRESSION L5-S1, L4-L5;  Surgeon: Susa Day, MD;  Location: [facility];  Service: Orthopedics;  Laterality: N/A;   s/p compound fx left femur s/p rod, now removed     TUBAL LIGATION      reports that she has never smoked. She has never used smokeless tobacco. She reports current alcohol use. She reports that she does not use drugs. family history includes Alcohol abuse in her father and mother; Breast cancer in an other family member; COPD in some other family members; Cancer in her paternal uncle; Depression in her mother; Diabetes in her father and another family member; Heart disease in her father and another family member; Hyperlipidemia in her father and another family member; Hypertension in her father and another family member; Stroke in her mother and another family member. Allergies  Allergen Reactions   Sulfa Antibiotics Hives   Current Outpatient Medications on File Prior to Visit  Medication Sig Dispense Refill   citalopram (CELEXA) 20 MG tablet Take 1 tablet (20 mg total) by mouth daily. For depression (Patient not taking: Reported on 06/07/2021) 90 tablet 3   gabapentin (NEURONTIN) 100 MG capsule TAKE 1 CAPSULE BY MOUTH THREE TIMES DAILY FOR AGITATION (Patient not taking: Reported on 06/07/2021) 270 capsule 0   hydrOXYzine (ATARAX/VISTARIL) 25 MG tablet Take  1 tablet (25 mg total) by mouth every 6 (six) hours as needed for anxiety. (Patient not taking: Reported on 06/07/2021) 75 tablet 3   No current facility-administered medications on file prior to visit.        ROS:  All others reviewed and negative.  Objective        PE:  BP 108/60 (BP Location: Left Arm, Patient Position: Sitting, Cuff Size: Large)   Pulse 64   Ht 5' 7.5" (1.715 m)   Wt 178 lb (80.7 kg)   LMP 01/02/2016   SpO2 97%   BMI 27.47 kg/m                 Constitutional: Pt appears in NAD               HENT: Head: NCAT.                 Right Ear: External ear normal.                 Left Ear: External ear normal.                Eyes: . Pupils are equal, round, and reactive to light. Conjunctivae and EOM are normal               Nose: without d/c or deformity               Neck: Neck supple. Gross normal ROM               Cardiovascular: Normal rate and regular rhythm.                 Pulmonary/Chest: Effort normal and breath sounds without rales or wheezing.                Abd:  Soft, NT, ND, + BS, no organomegaly               Neurological: Pt is alert. At baseline orientation, motor grossly intact               Skin: Skin is warm. No rashes, no other new lesions, LE edema - none               Psychiatric: Pt behavior is normal without agitation   Micro: none  Cardiac tracings I have personally interpreted today:  none  Pertinent Radiological findings (summarize): none   Lab Results  Component Value Date   WBC 3.1 (L) 06/05/2021   HGB 13.6 06/05/2021   HCT 40.8 06/05/2021   PLT 215.0 06/05/2021   GLUCOSE 93 06/05/2021   CHOL 184 06/05/2021   TRIG 61.0 06/05/2021   HDL 74.40 06/05/2021   LDLCALC 98 06/05/2021   ALT 21 06/05/2021   AST 18 06/05/2021   NA 137 06/05/2021   K 4.2 06/05/2021   CL 103 06/05/2021   CREATININE 0.73 06/05/2021   BUN 19 06/05/2021   CO2 25 06/05/2021   TSH 3.15 06/05/2021   HGBA1C 5.5 06/05/2021   Assessment/Plan:  [name] is a 50 y.o. White or Caucasian [1] female with  has a past medical history of Abdominal pain, epigastric (06/19/2010), Allergic rhinitis (07/09/2018), Allergy, Cancer ([place]), DEPRESSION (05/02/2010), HLD (hyperlipidemia) (07/09/2018), HYPERTHYROIDISM (05/02/2010), HYPOTHYROIDISM (05/02/2010), INSOMNIA (05/02/2010), NAUSEA (06/19/2010), and TRANSAMINASES, SERUM, ELEVATED (05/02/2010).  Encounter for well adult exam with abnormal findings Age and sex appropriate education and counseling updated with regular exercise and diet Referrals for preventative  services - pt will  call for GYN for pap/mammogram Immunizations addressed - declines covid vooster, shingrix, flu shot Smoking counseling  - none needed Evidence for depression or other mood disorder - none significant Most recent labs reviewed. I have personally reviewed and have noted: 1) the patient's medical and social history 2) The patient's current medications and supplements 3) The patient's height, weight, and BMI have been recorded in the chart   Vitamin D deficiency Last vitamin D Lab Results  Component Value Date   VD25OH 17.52 (L) 06/05/2021   Low to start oral replacement   Urinary frequency Now with suspected uti - for urine culture, and empiric cephalexin course  Bunion of great toe of right foot Noted, mild, delcines podiatry for now  Hyperglycemia Lab Results  Component Value Date   HGBA1C 5.5 06/05/2021   Stable, pt to continue current medical treatment  -diet   Hypothyroidism Lab Results  Component Value Date   TSH 3.15 06/05/2021   Stable, pt to continue levothyroxine  Followup: Return in about 1 year (around 06/07/2022).  Cathlean Cower, MD 06/13/2021 10:36 PM [facility]

## 2021-06-07 NOTE — Patient Instructions (Signed)
You are given the work note today  Please take OTC Vitamin D3 at 2000 units per day, indefinitely  Please take all new medication as prescribed  - the antibiotic  Please continue all other medications as before, and refills have been done if requested.  Please have the pharmacy call with any other refills you may need.  Please continue your efforts at being more active, low cholesterol diet, and weight control.  You are otherwise up to date with prevention measures today.  Please keep your appointments with your specialists as you may have planned  Please make an Appointment to return for your 1 year visit, or sooner if needed, with Lab testing by Appointment as well, to be done about 3-5 days before at the [facility] (so this is for TWO appointments - please see the scheduling desk as you leave)  Due to the ongoing Covid 19 pandemic, our lab now requires an appointment for any labs done at our office.  If you need labs done and do not have an appointment, please call our office ahead of time to schedule before presenting to the lab for your testing.

## 2021-06-13 ENCOUNTER — Encounter: Payer: Self-pay | Admitting: Internal Medicine

## 2021-06-13 NOTE — Assessment & Plan Note (Signed)
Now with suspected uti - for urine culture, and empiric cephalexin course

## 2021-06-13 NOTE — Assessment & Plan Note (Signed)
Lab Results  Component Value Date   HGBA1C 5.5 06/05/2021   Stable, pt to continue current medical treatment  -diet

## 2021-06-13 NOTE — Assessment & Plan Note (Signed)
Noted, mild, delcines podiatry for now

## 2021-06-13 NOTE — Assessment & Plan Note (Signed)
Lab Results  Component Value Date   TSH 3.15 06/05/2021   Stable, pt to continue levothyroxine

## 2021-06-13 NOTE — Assessment & Plan Note (Signed)
Age and sex appropriate education and counseling updated with regular exercise and diet Referrals for preventative services - pt will call for GYN for pap/mammogram Immunizations addressed - declines covid vooster, shingrix, flu shot Smoking counseling  - none needed Evidence for depression or other mood disorder - none significant Most recent labs reviewed. I have personally reviewed and have noted: 1) the patient's medical and social history 2) The patient's current medications and supplements 3) The patient's height, weight, and BMI have been recorded in the chart

## 2021-06-13 NOTE — Assessment & Plan Note (Signed)
Last vitamin D Lab Results  Component Value Date   VD25OH 17.52 (L) 06/05/2021   Low to start oral replacement

## 2021-07-17 ENCOUNTER — Telehealth: Payer: Self-pay | Admitting: Internal Medicine

## 2021-07-17 NOTE — Telephone Encounter (Signed)
Type of form received: Annual [employer] Form     Form placed in: Provider mailbox  Additional instructions from the patient :notify by phone when complete  *Due on 07/27/21* Things to remember: [place] office: If form received in person, remind patient that forms take 7-10 business days CMA should attach charge sheet and put on Supervisor's desk

## 2021-07-24 NOTE — Telephone Encounter (Signed)
Paperwork completed. Patient notified and will pick up papers tomorrow

## 2021-12-29 ENCOUNTER — Encounter: Payer: Self-pay | Admitting: Internal Medicine

## 2021-12-29 ENCOUNTER — Ambulatory Visit (INDEPENDENT_AMBULATORY_CARE_PROVIDER_SITE_OTHER): Payer: BC Managed Care – PPO | Admitting: Internal Medicine

## 2021-12-29 ENCOUNTER — Telehealth: Payer: Self-pay

## 2021-12-29 VITALS — BP 118/60 | HR 62 | Temp 98.1°F | Ht 67.5 in | Wt 182.0 lb

## 2021-12-29 DIAGNOSIS — B009 Herpesviral infection, unspecified: Secondary | ICD-10-CM | POA: Insufficient documentation

## 2021-12-29 DIAGNOSIS — R350 Frequency of micturition: Secondary | ICD-10-CM | POA: Diagnosis not present

## 2021-12-29 DIAGNOSIS — R197 Diarrhea, unspecified: Secondary | ICD-10-CM

## 2021-12-29 DIAGNOSIS — R739 Hyperglycemia, unspecified: Secondary | ICD-10-CM

## 2021-12-29 DIAGNOSIS — E559 Vitamin D deficiency, unspecified: Secondary | ICD-10-CM

## 2021-12-29 DIAGNOSIS — E669 Obesity, unspecified: Secondary | ICD-10-CM | POA: Diagnosis not present

## 2021-12-29 DIAGNOSIS — F32A Depression, unspecified: Secondary | ICD-10-CM

## 2021-12-29 LAB — URINALYSIS, ROUTINE W REFLEX MICROSCOPIC
Bilirubin Urine: NEGATIVE
Hgb urine dipstick: NEGATIVE
Ketones, ur: NEGATIVE
Nitrite: NEGATIVE
Specific Gravity, Urine: 1.025 (ref 1.000–1.030)
Total Protein, Urine: NEGATIVE
Urine Glucose: NEGATIVE
Urobilinogen, UA: 0.2 (ref 0.0–1.0)
pH: 6.5 (ref 5.0–8.0)

## 2021-12-29 MED ORDER — RIFAXIMIN 200 MG PO TABS: 200.0000 mg | ORAL_TABLET | Freq: Three times a day (TID) | ORAL | 0 refills | Status: DC

## 2021-12-29 MED ORDER — WEGOVY 0.5 MG/0.5ML ~~LOC~~ SOAJ: 0.5000 mg | SUBCUTANEOUS | 11 refills | Status: DC

## 2021-12-29 NOTE — Assessment & Plan Note (Signed)
Last vitamin D Lab Results  Component Value Date   VD25OH 17.52 (L) 06/05/2021   Low, to start oral replacement

## 2021-12-29 NOTE — Progress Notes (Unsigned)
Patient ID: [name], female   DOB: 1971/07/05, 51 y.o.   MRN: [id number]        Chief Complaint: follow up diarrhea, cloudy urine, hyperglycemia, low vit d, depression, obesity       HPI:  [name] is a 51 y.o. female here with c/o 2 wks onset rather constant diarrhea loose stools with mild intermittent crampy abd pains, nausea , some bloating, but Denies worsening reflux, other abd pain, dysphagia, vomiting, constipation or blood.   Pt denies fever, wt loss, night sweats, loss of appetite, or other constitutional symptoms  Denies worsening depressive symptoms, suicidal ideation, or panic.  Denies urinary symptoms such as dysuria, frequency, urgency, flank pain, hematuria or n/v, fever, chills, but does have cloudy urine in the past few days as well.  Pt denies chest pain, increased sob or doe, wheezing, orthopnea, PND, increased LE swelling, palpitations, dizziness or syncope.   Pt denies polydipsia, polyuria, or new focal neuro s/s.  Wt Readings from Last 3 Encounters:  12/29/21 182 lb (82.6 kg)  06/07/21 178 lb (80.7 kg)  04/19/20 155 lb (70.3 kg)   BP Readings from Last 3 Encounters:  12/29/21 118/60  06/07/21 108/60  04/19/20 110/70         Past Medical History:  Diagnosis Date   Abdominal pain, epigastric 06/19/2010   Allergic rhinitis 07/09/2018   Allergy    Cancer ([place])    thyroid cancer    DEPRESSION 05/02/2010   HLD (hyperlipidemia) 07/09/2018   HYPERTHYROIDISM 05/02/2010   HYPOTHYROIDISM 05/02/2010   INSOMNIA 05/02/2010   NAUSEA 06/19/2010   TRANSAMINASES, SERUM, ELEVATED 05/02/2010   Past Surgical History:  Procedure Laterality Date   APPENDECTOMY     CESAREAN SECTION     x 2   CHOLECYSTECTOMY  2011   LUMBAR LAMINECTOMY/DECOMPRESSION MICRODISCECTOMY N/A 01/13/2016   Procedure: MICRO LUMBAR DECOMPRESSION L5-S1, L4-L5;  Surgeon: Susa Day, MD;  Location: [facility];  Service: Orthopedics;  Laterality: N/A;   s/p compound fx left femur s/p rod, now removed      TUBAL LIGATION      reports that she has never smoked. She has never used smokeless tobacco. She reports current alcohol use. She reports that she does not use drugs. family history includes Alcohol abuse in her father and mother; Breast cancer in an other family member; COPD in some other family members; Cancer in her paternal uncle; Depression in her mother; Diabetes in her father and another family member; Heart disease in her father and another family member; Hyperlipidemia in her father and another family member; Hypertension in her father and another family member; Stroke in her mother and another family member. Allergies  Allergen Reactions   Sulfa Antibiotics Hives   Current Outpatient Medications on File Prior to Visit  Medication Sig Dispense Refill   levothyroxine (SYNTHROID) 112 MCG tablet TAKE 1 TABLET BY MOUTH ONCE DAILY BEFORE BREAKFAST 90 tablet 3   No current facility-administered medications on file prior to visit.        ROS:  All others reviewed and negative.  Objective        PE:  BP 118/60 (BP Location: Right Arm, Patient Position: Sitting, Cuff Size: Large)   Pulse 62   Temp 98.1 F (36.7 C) (Oral)   Ht 5' 7.5" (1.715 m)   Wt 182 lb (82.6 kg)   LMP 01/02/2016   SpO2 98%   BMI 28.08 kg/m  Constitutional: Pt appears in NAD               HENT: Head: NCAT.                Right Ear: External ear normal.                 Left Ear: External ear normal.                Eyes: . Pupils are equal, round, and reactive to light. Conjunctivae and EOM are normal               Nose: without d/c or deformity               Neck: Neck supple. Gross normal ROM               Cardiovascular: Normal rate and regular rhythm.                 Pulmonary/Chest: Effort normal and breath sounds without rales or wheezing.                Abd:  Soft, NT, ND, + BS, no organomegaly               Neurological: Pt is alert. At baseline orientation, motor grossly intact                Skin: Skin is warm. No rashes, no other new lesions, LE edema - none               Psychiatric: Pt behavior is normal without agitation   Micro: none  Cardiac tracings I have personally interpreted today:  none  Pertinent Radiological findings (summarize): none   Lab Results  Component Value Date   WBC 3.1 (L) 06/05/2021   HGB 13.6 06/05/2021   HCT 40.8 06/05/2021   PLT 215.0 06/05/2021   GLUCOSE 93 06/05/2021   CHOL 184 06/05/2021   TRIG 61.0 06/05/2021   HDL 74.40 06/05/2021   LDLCALC 98 06/05/2021   ALT 21 06/05/2021   AST 18 06/05/2021   NA 137 06/05/2021   K 4.2 06/05/2021   CL 103 06/05/2021   CREATININE 0.73 06/05/2021   BUN 19 06/05/2021   CO2 25 06/05/2021   TSH 3.15 06/05/2021   HGBA1C 5.5 06/05/2021   Assessment/Plan:  [name] is a 51 y.o. White or Caucasian [1] female with  has a past medical history of Abdominal pain, epigastric (06/19/2010), Allergic rhinitis (07/09/2018), Allergy, Cancer ([place]), DEPRESSION (05/02/2010), HLD (hyperlipidemia) (07/09/2018), HYPERTHYROIDISM (05/02/2010), HYPOTHYROIDISM (05/02/2010), INSOMNIA (05/02/2010), NAUSEA (06/19/2010), and TRANSAMINASES, SERUM, ELEVATED (05/02/2010).  Vitamin D deficiency Last vitamin D Lab Results  Component Value Date   VD25OH 17.52 (L) 06/05/2021   Low, to start oral replacement   Depression Stable overall, declines need for change in tx or counseling  Hyperglycemia Lab Results  Component Value Date   HGBA1C 5.5 06/05/2021   Stable, pt to continue current medical treatment  - diet   Diarrhea Likely IBS - D , for empiric xifaxan course, GI panel, ova and parasite, refer GI  Obesity (BMI 30-39.9) Also for wegovy asd,  to f/u any worsening symptoms or concerns  Urinary frequency With some cloudy urine - for urine studies Followup: Return if symptoms worsen or fail to improve.  Cathlean Cower, MD 12/30/2021 4:11 PM [facility]

## 2021-12-29 NOTE — Telephone Encounter (Signed)
PA initiated for patient's [facility].  Key: [id number]

## 2021-12-29 NOTE — Patient Instructions (Signed)
Please take all new medication as prescribed - the xifaxan for presumed IBS with diarrhea  Please take all new medication as prescribed - the wegovy if ok with the insurance  Please continue all other medications as before, and refills have been done if requested.  Please have the pharmacy call with any other refills you may need.  Please keep your appointments with your specialists as you may have planned  You will be contacted regarding the referral for: Gastroenterology  Please go to the LAB at the blood drawing area for the tests to be done - the stool and urine testing  You will be contacted by phone if any changes need to be made immediately.  Otherwise, you will receive a letter about your results with an explanation, but please check with MyChart first.  Please remember to sign up for MyChart if you have not done so, as this will be important to you in the future with finding out test results, communicating by private email, and scheduling acute appointments online when needed.

## 2021-12-30 ENCOUNTER — Encounter: Payer: Self-pay | Admitting: Internal Medicine

## 2021-12-30 NOTE — Assessment & Plan Note (Signed)
Likely IBS - D , for empiric xifaxan course, GI panel, ova and parasite, refer GI

## 2021-12-30 NOTE — Assessment & Plan Note (Signed)
Lab Results  Component Value Date   HGBA1C 5.5 06/05/2021   Stable, pt to continue current medical treatment  - diet

## 2021-12-30 NOTE — Assessment & Plan Note (Signed)
Stable overall, declines need for change in tx or counseling 

## 2021-12-30 NOTE — Assessment & Plan Note (Signed)
Also for wegovy asd,  to f/u any worsening symptoms or concerns

## 2021-12-30 NOTE — Assessment & Plan Note (Signed)
With some cloudy urine - for urine studies

## 2021-12-31 LAB — URINE CULTURE

## 2022-01-12 ENCOUNTER — Other Ambulatory Visit (HOSPITAL_COMMUNITY): Payer: Self-pay

## 2022-01-12 ENCOUNTER — Telehealth: Payer: Self-pay | Admitting: Internal Medicine

## 2022-01-12 ENCOUNTER — Other Ambulatory Visit: Payer: Self-pay | Admitting: Internal Medicine

## 2022-01-12 MED ORDER — WEGOVY 0.5 MG/0.5ML ~~LOC~~ SOAJ
0.5000 mg | SUBCUTANEOUS | 11 refills | Status: DC
  Filled 2022-01-12: qty 2, 28d supply, fill #0

## 2022-01-12 MED ORDER — RIFAXIMIN 200 MG PO TABS
200.0000 mg | ORAL_TABLET | Freq: Three times a day (TID) | ORAL | 0 refills | Status: DC
Start: 2022-01-12 — End: 2022-06-08
  Filled 2022-01-12: qty 42, 14d supply, fill #0
  Filled 2022-02-16: qty 18, 6d supply, fill #0

## 2022-01-12 NOTE — Telephone Encounter (Signed)
Prescriptions sent to [facility]

## 2022-01-12 NOTE — Telephone Encounter (Signed)
Pt has been unable to get her WEGOVY and her RIFAXIMIN RX, pharmacy told her they were out and she would need to have it sent to another facility.   Pt is requesting that we send her RX to [facility] instead of the old pharmacy. If this could happen today before 12:00 when the PT goes to work that would be even better.

## 2022-01-19 ENCOUNTER — Other Ambulatory Visit (HOSPITAL_COMMUNITY): Payer: Self-pay

## 2022-01-19 ENCOUNTER — Telehealth: Payer: Self-pay

## 2022-01-19 NOTE — Telephone Encounter (Signed)
Pt is requesting that  Semaglutide-Weight Management (WEGOVY) 0.5 MG/0.5ML SOAJ be switched to [facility] due to the supply issue not being able to get it.   Pharmacy: [place]

## 2022-01-24 ENCOUNTER — Other Ambulatory Visit (HOSPITAL_COMMUNITY): Payer: Self-pay

## 2022-01-24 MED ORDER — WEGOVY 0.5 MG/0.5ML ~~LOC~~ SOAJ
0.5000 mg | SUBCUTANEOUS | 11 refills | Status: DC
  Filled 2022-01-24 – 2022-02-16 (×2): qty 2, 28d supply, fill #0
  Filled 2022-03-02: qty 2, 28d supply, fill #1
  Filled 2022-03-31: qty 2, 28d supply, fill #2
  Filled 2022-05-06 – 2022-05-23 (×2): qty 2, 28d supply, fill #3

## 2022-01-24 NOTE — Telephone Encounter (Signed)
Please ask pt to check with insurance first, as this may be very expensive over $1000 depending on her insurance

## 2022-01-24 NOTE — Telephone Encounter (Signed)
Spoke with patient regarding medication. Patient would like for me to send Wegovy to [place] to see if it will be approved and filled.

## 2022-02-16 ENCOUNTER — Other Ambulatory Visit (HOSPITAL_COMMUNITY): Payer: Self-pay

## 2022-03-02 ENCOUNTER — Other Ambulatory Visit (HOSPITAL_COMMUNITY): Payer: Self-pay

## 2022-03-05 ENCOUNTER — Other Ambulatory Visit (HOSPITAL_COMMUNITY): Payer: Self-pay

## 2022-03-13 ENCOUNTER — Other Ambulatory Visit (HOSPITAL_COMMUNITY): Payer: Self-pay

## 2022-03-19 IMAGING — CT CT ANGIO CHEST-ABD-PELV FOR DISSECTION W/ AND WO/W CM
2 of 7 series · 11 of 46 positions shown, 12 images · non-contrast
Comparison: CT abdomen pelvis 06/08/2015, chest radiograph
02/27/2020

CLINICAL DATA: Substernal chest pain, word-finding difficulties and
blurred vision which began yesterday

EXAM:
CT ANGIOGRAPHY CHEST, ABDOMEN AND PELVIS
TECHNIQUE: Non-contrast CT of the chest was initially obtained.

[Series 6: axial arterial · axial · arterial · 0.72mm/px · z∈[-632,-62]mm · 8 of 220 slices shown, 9 images]
[im 15/220  soft-tissue]
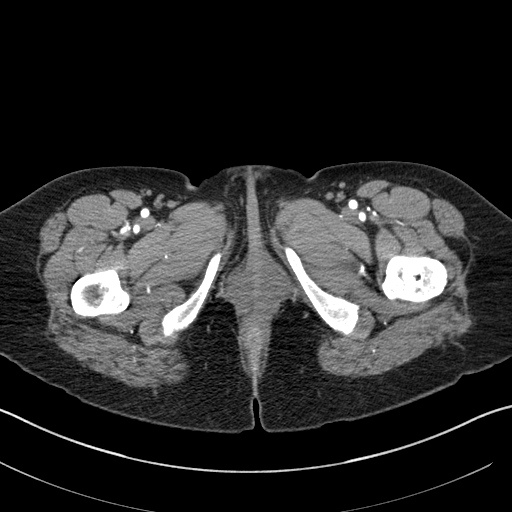
[im 15/220  bone]
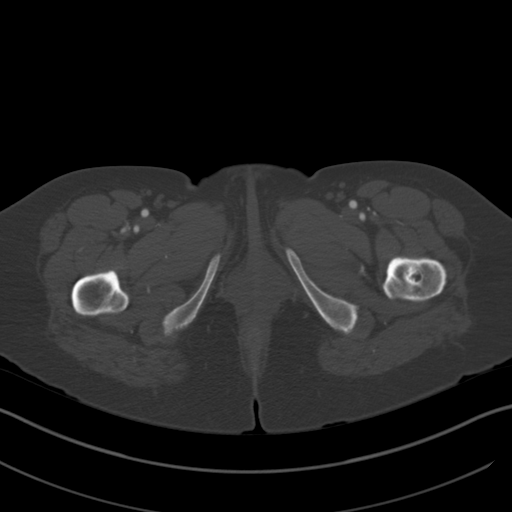
[im 44/220  soft-tissue]
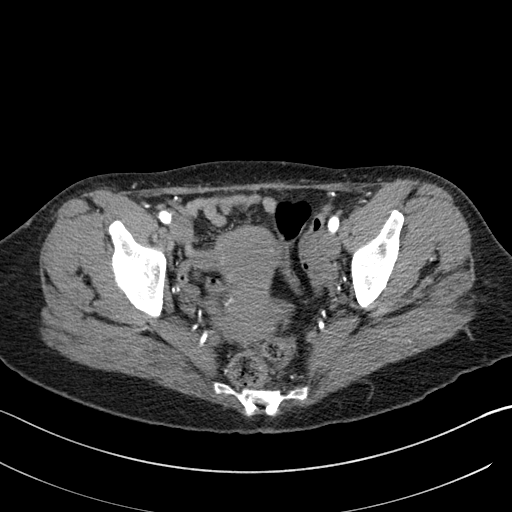
[im 74/220  soft-tissue]
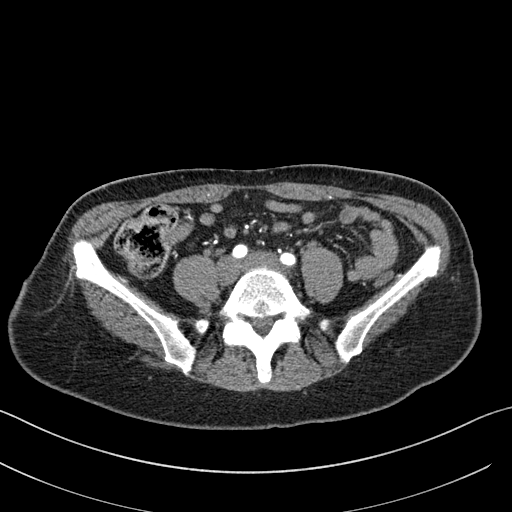
[im 103/220  soft-tissue]
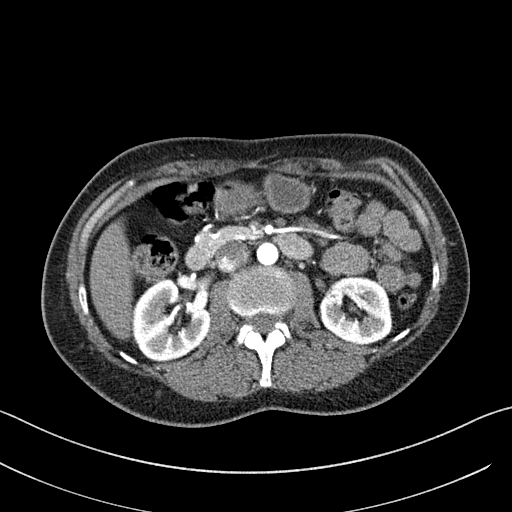
[im 117/220  soft-tissue]
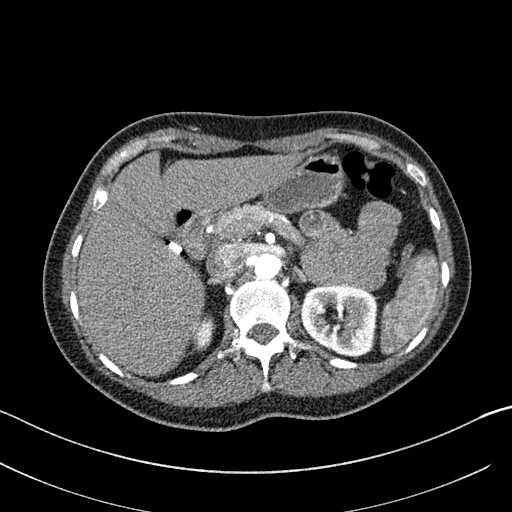
[im 147/220  soft-tissue]
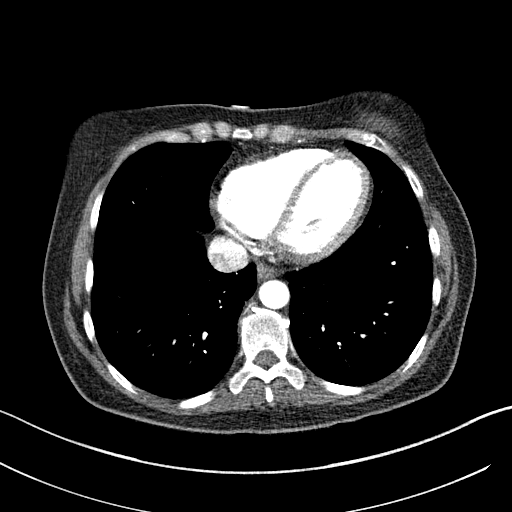
[im 176/220  soft-tissue]
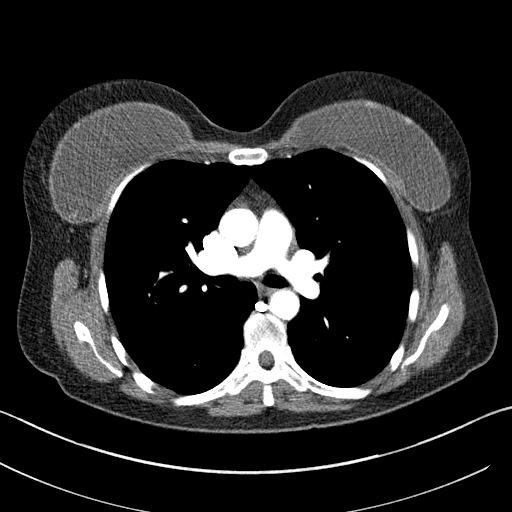
[im 205/220  soft-tissue]
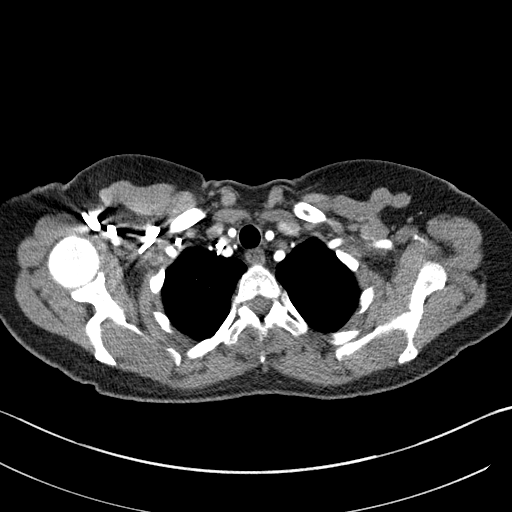

[Series 9: coronals · coronal · 0.67mm/px · 3 of 133 slices shown]
[im 34/133  soft-tissue]
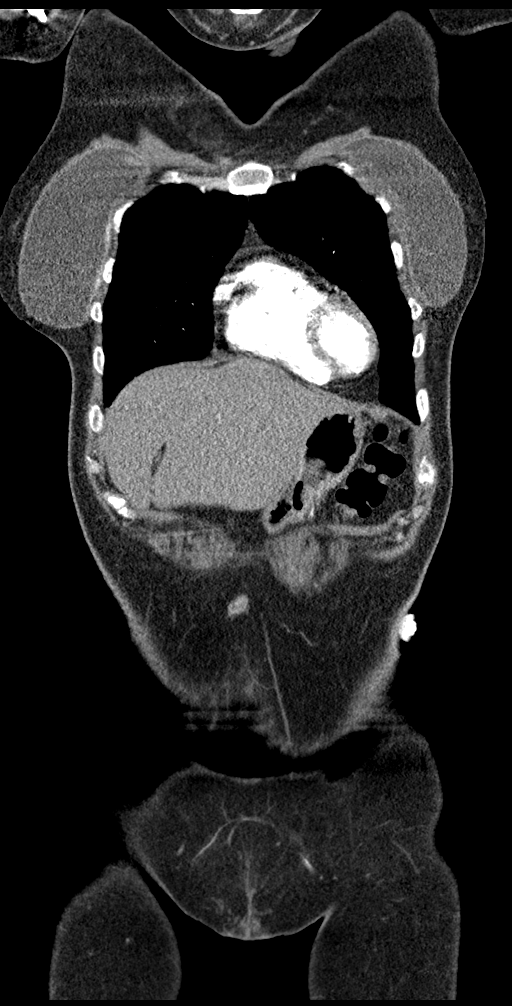
[im 67/133  soft-tissue]
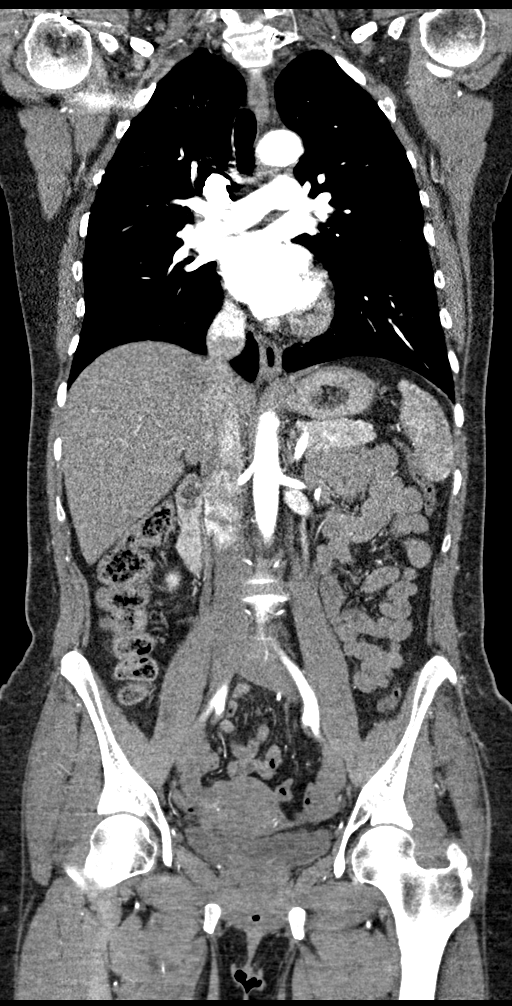
[im 100/133  soft-tissue]
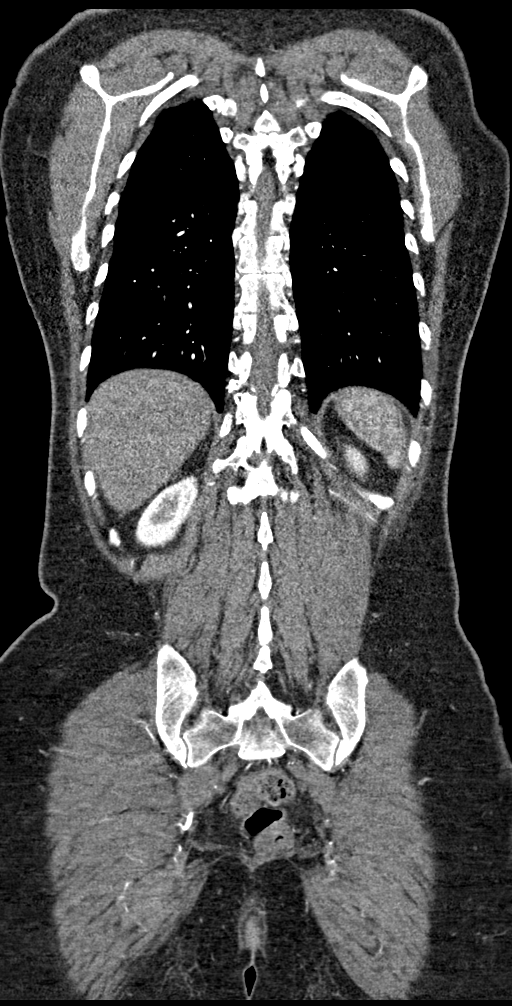

[11 of 46 positions shown; findings below may reference images not displayed]

Multidetector CT imaging through the chest, abdomen and pelvis was
performed using the standard protocol during bolus administration of
intravenous contrast. Multiplanar reconstructed images and MIPs were
obtained and reviewed to evaluate the vascular anatomy.

CONTRAST:  100mL OMNIPAQUE IOHEXOL 350 MG/ML SOLN
FINDINGS: CTA CHEST FINDINGS

Cardiovascular: Noncontrast CT of the chest performed initially
reveals a normal caliber thoracic aorta without abnormal hyperdense
mural thickening to suggest intramural hematoma. Postcontrast
administration, there is satisfactory opacification of the
thoracoabdominal aorta and branch vessels. The aortic root is
suboptimally assessed given cardiac pulsation artifact. The aorta is
normal caliber. No acute luminal abnormality such as I dissection
flap or penetrating ulcer. No periaortic stranding or hemorrhage.
Shared origin of the brachiocephalic and left common carotid artery.
Proximal great vessels are normally opacified and free of acute
abnormality. Central pulmonary arteries are normal caliber. No large
central, lobar or proximal segmental filling defects on this non
tailored examination. Small amount of contrast reflux into the
azygos and hemi azygous veins. Small amount of gas in the left
brachiocephalic vein likely related to intravenous access. No other
major venous abnormality. Normal heart size. No pericardial
effusion.

Mediastinum/Nodes: No mediastinal fluid or gas. Normal thyroid gland
and thoracic inlet. No acute abnormality of the trachea or
esophagus. No worrisome mediastinal, hilar or axillary adenopathy.

Lungs/Pleura: No consolidation, features of edema, pneumothorax, or
effusion. No suspicious pulmonary nodules or masses.

Musculoskeletal: No acute or suspicious osseous abnormalities.
Bilateral breast prostheses with several small radial folds in the
right prosthesis no acute abnormality of the implants are evident on
this exam. No worrisome chest wall abnormalities.

Review of the MIP images confirms the above findings.

CTA ABDOMEN AND PELVIS FINDINGS

VASCULAR

Aorta: Normal caliber aorta. No acute luminal abnormality. No
periaortic stranding or hemorrhage. No significant stenosis,
occlusion or evidence of vasculitis.

Celiac: Slightly hook like configuration with indentation along the
superior lumen the proximal vessel best seen on sagittal recon
(10/95). Could reflect a mild arcuate ligament compression.
Otherwise normal opacification without other significant stenosis or
occlusion. Typical branching pattern. No evidence of aneurysm,
dissection or vasculitis.

SMA: Widely patent and normally opacified. No evidence of aneurysm,
dissection or vasculitis.

Renals: Single renal arteries bilaterally. Both renal arteries are
patent without evidence of aneurysm, dissection, vasculitis,
fibromuscular dysplasia or significant stenosis.

IMA: Widely patent. Typical branching pattern. No evidence of
aneurysm, dissection or vasculitis.

Inflow: Patent without evidence of aneurysm, dissection, vasculitis
or significant stenosis.

Veins: No obvious venous abnormality within the limitations of this
arterial phase study. Proximal outflow vessels including the common
femoral, superficial femoral and deep femoral arteries are widely
patent as well.

Review of the MIP images confirms the above findings.

NON-VASCULAR

Hepatobiliary: No worrisome focal liver lesions. Smooth liver
surface contour. Normal hepatic attenuation. Patient is post
cholecystectomy. No significant biliary ductal dilatation or visible
calcified intraductal gallstones.

Pancreas: Unremarkable. No pancreatic ductal dilatation or
surrounding inflammatory changes.

Spleen: Heterogeneous attenuation of the spleen is quite typical for
the arterial phase of enhancement. Normal splenic size. No
concerning splenic lesion.

Adrenals/Urinary Tract: Normal adrenal glands. Kidneys enhance
symmetrically and uniformly. Subcentimeter hypoattenuating focus in
the anterior interpolar left kidney too small to fully characterize
on CT imaging but statistically likely benign. No concerning renal
lesions. No urolithiasis or hydronephrosis. Urinary bladder is
largely decompressed at the time of exam and therefore poorly
evaluated by CT imaging. No gross bladder abnormality is seen.

Stomach/Bowel: Distal esophagus, stomach and duodenal sweep are
unremarkable. No small bowel wall thickening or dilatation. No
evidence of obstruction. The appendix is surgically absent. No
colonic dilatation or wall thickening.

Lymphatic: No suspicious or enlarged lymph nodes in the included
lymphatic chains.

Reproductive: Anteverted uterus. No concerning adnexal lesions.

Other: No abdominopelvic free fluid or free gas. No bowel containing
hernias.

Musculoskeletal: Minimal degenerative change at L5-S1. Evidence of
prior left femoral intramedullary rod placement and removal.
Postsurgical changes are similar in appearance to comparison. No
acute or worrisome osseous lesions.

Review of the MIP images confirms the above findings.
IMPRESSION: 1. No evidence of acute aortic syndrome.
2. No acute intrathoracic or intra-abdominal process.
3. Slightly hook like configuration of the proximal celiac axis with
indentation along the superior lumen the vessel. Could reflect a
mild arcuate ligament compression which can be asymptomatic in the
majority of patients with this configuration.
4. Prior cholecystectomy, appendectomy and left femoral
intramedullary rod placement and removal.

## 2022-03-31 ENCOUNTER — Other Ambulatory Visit (HOSPITAL_COMMUNITY): Payer: Self-pay

## 2022-04-03 ENCOUNTER — Other Ambulatory Visit (HOSPITAL_COMMUNITY): Payer: Self-pay

## 2022-04-04 ENCOUNTER — Other Ambulatory Visit (HOSPITAL_COMMUNITY): Payer: Self-pay

## 2022-05-09 ENCOUNTER — Other Ambulatory Visit (HOSPITAL_COMMUNITY): Payer: Self-pay

## 2022-05-11 ENCOUNTER — Other Ambulatory Visit (HOSPITAL_COMMUNITY): Payer: Self-pay

## 2022-05-17 ENCOUNTER — Other Ambulatory Visit (HOSPITAL_COMMUNITY): Payer: Self-pay

## 2022-05-23 ENCOUNTER — Other Ambulatory Visit (HOSPITAL_COMMUNITY): Payer: Self-pay

## 2022-06-08 ENCOUNTER — Other Ambulatory Visit: Payer: Self-pay | Admitting: Internal Medicine

## 2022-06-08 ENCOUNTER — Other Ambulatory Visit (HOSPITAL_COMMUNITY): Payer: Self-pay

## 2022-06-08 ENCOUNTER — Ambulatory Visit (INDEPENDENT_AMBULATORY_CARE_PROVIDER_SITE_OTHER): Payer: BC Managed Care – PPO | Admitting: Internal Medicine

## 2022-06-08 VITALS — BP 122/68 | HR 67 | Temp 98.5°F | Ht 67.0 in | Wt 180.0 lb

## 2022-06-08 DIAGNOSIS — F32A Depression, unspecified: Secondary | ICD-10-CM

## 2022-06-08 DIAGNOSIS — E559 Vitamin D deficiency, unspecified: Secondary | ICD-10-CM | POA: Diagnosis not present

## 2022-06-08 DIAGNOSIS — Z0001 Encounter for general adult medical examination with abnormal findings: Secondary | ICD-10-CM

## 2022-06-08 DIAGNOSIS — E538 Deficiency of other specified B group vitamins: Secondary | ICD-10-CM | POA: Diagnosis not present

## 2022-06-08 DIAGNOSIS — E785 Hyperlipidemia, unspecified: Secondary | ICD-10-CM | POA: Diagnosis not present

## 2022-06-08 DIAGNOSIS — R739 Hyperglycemia, unspecified: Secondary | ICD-10-CM

## 2022-06-08 DIAGNOSIS — R8290 Unspecified abnormal findings in urine: Secondary | ICD-10-CM | POA: Diagnosis not present

## 2022-06-08 LAB — LIPID PANEL
Cholesterol: 159 mg/dL (ref 0–200)
HDL: 65.8 mg/dL (ref >39.00)
LDL Cholesterol: 73 mg/dL (ref 0–99)
NonHDL: 93.6
Total CHOL/HDL Ratio: 2
Triglycerides: 101 mg/dL (ref 0.0–149.0)
VLDL: 20.2 mg/dL (ref 0.0–40.0)

## 2022-06-08 LAB — HEPATIC FUNCTION PANEL
ALT: 41 U/L — ABNORMAL HIGH (ref 0–35)
AST: 24 U/L (ref 0–37)
Albumin: 4.1 g/dL (ref 3.5–5.2)
Alkaline Phosphatase: 83 U/L (ref 39–117)
Bilirubin, Direct: 0 mg/dL (ref 0.0–0.3)
Total Bilirubin: 0.3 mg/dL (ref 0.2–1.2)
Total Protein: 7.1 g/dL (ref 6.0–8.3)

## 2022-06-08 LAB — BASIC METABOLIC PANEL
BUN: 16 mg/dL (ref 6–23)
CO2: 29 mEq/L (ref 19–32)
Calcium: 9.2 mg/dL (ref 8.4–10.5)
Chloride: 106 mEq/L (ref 96–112)
Creatinine, Ser: 0.96 mg/dL (ref 0.40–1.20)
GFR: 68.43 mL/min (ref >60.00)
Glucose, Bld: 90 mg/dL (ref 70–99)
Potassium: 4.4 mEq/L (ref 3.5–5.1)
Sodium: 140 mEq/L (ref 135–145)

## 2022-06-08 LAB — CBC WITH DIFFERENTIAL/PLATELET
Basophils Absolute: 0 10*3/uL (ref 0.0–0.1)
Basophils Relative: 0.7 % (ref 0.0–3.0)
Eosinophils Absolute: 0.1 10*3/uL (ref 0.0–0.7)
Eosinophils Relative: 2.6 % (ref 0.0–5.0)
HCT: 37.4 % (ref 36.0–46.0)
Hemoglobin: 12.5 g/dL (ref 12.0–15.0)
Lymphocytes Relative: 41.6 % (ref 12.0–46.0)
Lymphs Abs: 2 10*3/uL (ref 0.7–4.0)
MCHC: 33.4 g/dL (ref 30.0–36.0)
MCV: 95.3 fl (ref 78.0–100.0)
Monocytes Absolute: 0.4 10*3/uL (ref 0.1–1.0)
Monocytes Relative: 7.9 % (ref 3.0–12.0)
Neutro Abs: 2.2 10*3/uL (ref 1.4–7.7)
Neutrophils Relative %: 47.2 % (ref 43.0–77.0)
Platelets: 225 10*3/uL (ref 150.0–400.0)
RBC: 3.92 Mil/uL (ref 3.87–5.11)
RDW: 14 % (ref 11.5–15.5)
WBC: 4.7 10*3/uL (ref 4.0–10.5)

## 2022-06-08 LAB — URINALYSIS, ROUTINE W REFLEX MICROSCOPIC
Bilirubin Urine: NEGATIVE
Hgb urine dipstick: NEGATIVE
Nitrite: NEGATIVE
Specific Gravity, Urine: 1.015 (ref 1.000–1.030)
Total Protein, Urine: NEGATIVE
Urine Glucose: NEGATIVE
Urobilinogen, UA: 0.2 (ref 0.0–1.0)
pH: 7 (ref 5.0–8.0)

## 2022-06-08 LAB — TSH: TSH: 1.57 u[IU]/mL (ref 0.35–5.50)

## 2022-06-08 LAB — HEMOGLOBIN A1C: Hgb A1c MFr Bld: 5.6 % (ref 4.6–6.5)

## 2022-06-08 LAB — VITAMIN B12: Vitamin B-12: 340 pg/mL (ref 211–911)

## 2022-06-08 LAB — VITAMIN D 25 HYDROXY (VIT D DEFICIENCY, FRACTURES): VITD: 14.27 ng/mL — ABNORMAL LOW (ref 30.00–100.00)

## 2022-06-08 MED ORDER — SHINGRIX 50 MCG/0.5ML IM SUSR
0.5000 mL | Freq: Once | INTRAMUSCULAR | 0 refills | Status: AC
  Filled 2022-06-08: qty 0.5, 1d supply, fill #0

## 2022-06-08 MED ORDER — CEPHALEXIN 500 MG PO CAPS
500.0000 mg | ORAL_CAPSULE | Freq: Three times a day (TID) | ORAL | 0 refills | Status: DC
  Filled 2022-06-08: qty 30, 10d supply, fill #0

## 2022-06-08 MED ORDER — WEGOVY 1 MG/0.5ML ~~LOC~~ SOAJ
1.0000 mg | SUBCUTANEOUS | 3 refills | Status: DC
  Filled 2022-06-08: qty 2, 28d supply, fill #0
  Filled 2022-10-23: qty 6, 84d supply, fill #0

## 2022-06-08 MED ORDER — LEVOTHYROXINE SODIUM 112 MCG PO TABS
ORAL_TABLET | ORAL | 3 refills | Status: DC
  Filled 2022-06-08 – 2022-06-28 (×2): qty 60, 60d supply, fill #0
  Filled 2022-08-23: qty 60, 60d supply, fill #1
  Filled 2022-10-23: qty 60, 60d supply, fill #2
  Filled 2022-12-29: qty 60, 60d supply, fill #3
  Filled 2023-02-25: qty 60, 60d supply, fill #4
  Filled 2023-04-25: qty 60, 60d supply, fill #5

## 2022-06-08 NOTE — Progress Notes (Unsigned)
Patient ID: [name], female   DOB: Jul 08, 1971, 51 y.o.   MRN: [id number]         Chief Complaint:: wellness exam and foul smelling urine, htn, hyperglycemia, hld, depression, low vit d       HPI:  [name] is a 51 y.o. female here for wellness exam; has mammogram scheduled soon with pap, declines covid booster, flu shot                        Also c/o 2 days osnet foul smelling urine with cloudiness, but Denies urinary symptoms such as dysuria, frequency, urgency, flank pain, hematuria or n/v, fever, chills.  Pt denies chest pain, increased sob or doe, wheezing, orthopnea, PND, increased LE swelling, palpitations, dizziness or syncope.   Pt denies polydipsia, polyuria, or new focal neuro s/s.    Pt denies fever, wt loss, night sweats, loss of appetite, or other constitutional symptoms     Wt Readings from Last 3 Encounters:  06/08/22 180 lb (81.6 kg)  12/29/21 182 lb (82.6 kg)  06/07/21 178 lb (80.7 kg)   BP Readings from Last 3 Encounters:  06/08/22 122/68  12/29/21 118/60  06/07/21 108/60   Immunization History  Administered Date(s) Administered   Influenza Split 04/26/2011   Influenza Whole 05/02/2010   Influenza,inj,Quad PF,6+ Mos 06/07/2015, 05/31/2017, 07/09/2018, 06/25/2019   PFIZER(Purple Top)SARS-COV-2 Vaccination 04/22/2020   Td 04/13/2004   Tdap 11/26/2013  There are no preventive care reminders to display for this patient.    Past Medical History:  Diagnosis Date   Abdominal pain, epigastric 06/19/2010   Allergic rhinitis 07/09/2018   Allergy    Cancer ([place])    thyroid cancer    DEPRESSION 05/02/2010   HLD (hyperlipidemia) 07/09/2018   HYPERTHYROIDISM 05/02/2010   HYPOTHYROIDISM 05/02/2010   INSOMNIA 05/02/2010   NAUSEA 06/19/2010   TRANSAMINASES, SERUM, ELEVATED 05/02/2010   Past Surgical History:  Procedure Laterality Date   APPENDECTOMY     CESAREAN SECTION     x 2   CHOLECYSTECTOMY  2011   LUMBAR LAMINECTOMY/DECOMPRESSION MICRODISCECTOMY N/A  01/13/2016   Procedure: MICRO LUMBAR DECOMPRESSION L5-S1, L4-L5;  Surgeon: Susa Day, MD;  Location: [facility];  Service: Orthopedics;  Laterality: N/A;   s/p compound fx left femur s/p rod, now removed     TUBAL LIGATION      reports that she has never smoked. She has never used smokeless tobacco. She reports current alcohol use. She reports that she does not use drugs. family history includes Alcohol abuse in her father and mother; Breast cancer in an other family member; COPD in some other family members; Cancer in her paternal uncle; Depression in her mother; Diabetes in her father and another family member; Heart disease in her father and another family member; Hyperlipidemia in her father and another family member; Hypertension in her father and another family member; Stroke in her mother and another family member. Allergies  Allergen Reactions   Sulfa Antibiotics Hives   No current outpatient medications on file prior to visit.   No current facility-administered medications on file prior to visit.        ROS:  All others reviewed and negative.  Objective        PE:  BP 122/68 (BP Location: Right Arm, Patient Position: Sitting, Cuff Size: Large)   Pulse 67   Temp 98.5 F (36.9 C) (Oral)   Ht '5\' 7"'$  (1.702 m)   Wt 180 lb (81.6  kg)   LMP 01/02/2016   SpO2 96%   BMI 28.19 kg/m                 Constitutional: Pt appears in NAD               HENT: Head: NCAT.                Right Ear: External ear normal.                 Left Ear: External ear normal.                Eyes: . Pupils are equal, round, and reactive to light. Conjunctivae and EOM are normal               Nose: without d/c or deformity               Neck: Neck supple. Gross normal ROM               Cardiovascular: Normal rate and regular rhythm.                 Pulmonary/Chest: Effort normal and breath sounds without rales or wheezing.                Abd:  Soft, NT, ND, + BS, no organomegaly                Neurological: Pt is alert. At baseline orientation, motor grossly intact               Skin: Skin is warm. No rashes, no other new lesions, LE edema - none               Psychiatric: Pt behavior is normal without agitation   Micro: none  Cardiac tracings I have personally interpreted today:  none  Pertinent Radiological findings (summarize): none   Lab Results  Component Value Date   WBC 4.7 06/08/2022   HGB 12.5 06/08/2022   HCT 37.4 06/08/2022   PLT 225.0 06/08/2022   GLUCOSE 90 06/08/2022   CHOL 159 06/08/2022   TRIG 101.0 06/08/2022   HDL 65.80 06/08/2022   LDLCALC 73 06/08/2022   ALT 41 (H) 06/08/2022   AST 24 06/08/2022   NA 140 06/08/2022   K 4.4 06/08/2022   CL 106 06/08/2022   CREATININE 0.96 06/08/2022   BUN 16 06/08/2022   CO2 29 06/08/2022   TSH 1.57 06/08/2022   HGBA1C 5.6 06/08/2022   Assessment/Plan:  [name] is a 51 y.o. White or Caucasian [1] female with  has a past medical history of Abdominal pain, epigastric (06/19/2010), Allergic rhinitis (07/09/2018), Allergy, Cancer ([place]), DEPRESSION (05/02/2010), HLD (hyperlipidemia) (07/09/2018), HYPERTHYROIDISM (05/02/2010), HYPOTHYROIDISM (05/02/2010), INSOMNIA (05/02/2010), NAUSEA (06/19/2010), and TRANSAMINASES, SERUM, ELEVATED (05/02/2010).  Encounter for well adult exam with abnormal findings Age and sex appropriate education and counseling updated with regular exercise and diet Referrals for preventative services - none needed Immunizations addressed - covid booster, flu shot Smoking counseling  - none needed Evidence for depression or other mood disorder - stable depression Most recent labs reviewed. I have personally reviewed and have noted: 1) the patient's medical and social history 2) The patient's current medications and supplements 3) The patient's height, weight, and BMI have been recorded in the chart   Hyperglycemia Lab Results  Component Value Date   HGBA1C 5.6 06/08/2022   Stable, pt to  continue current medical treatment  - diet, wt control,  excercise   HLD (hyperlipidemia) Lab Results  Component Value Date   LDLCALC 73 06/08/2022   Stable, pt to continue current statin low chol diet  Foul smelling urine Pt with high suspicion for uti, for urine and culture, also empiric cephalexin 500 tid  Depression Stable, declines need for change in tx or referral,  to f/u any worsening symptoms or concerns  Vitamin D deficiency Last vitamin D Lab Results  Component Value Date   VD25OH 14.27 (L) 06/08/2022   Low, to start oral replacement  Followup: No follow-ups on file.  Cathlean Cower, MD 06/09/2022 4:58 PM [facility]

## 2022-06-08 NOTE — Patient Instructions (Signed)
You had the flu shot today  Please continue all other medications as before, and refills have been done if requested.  Please have the pharmacy call with any other refills you may need.  Please continue your efforts at being more active, low cholesterol diet, and weight control.  You are otherwise up to date with prevention measures today.  Please keep your appointments with your specialists as you may have planned  Please go to the LAB at the blood drawing area for the tests to be done  You will be contacted by phone if any changes need to be made immediately.  Otherwise, you will receive a letter about your results with an explanation, but please check with MyChart first.  Please remember to sign up for MyChart if you have not done so, as this will be important to you in the future with finding out test results, communicating by private email, and scheduling acute appointments online when needed.  Please make an Appointment to return for your 1 year visit, or sooner if needed

## 2022-06-09 ENCOUNTER — Encounter: Payer: Self-pay | Admitting: Internal Medicine

## 2022-06-09 NOTE — Assessment & Plan Note (Signed)
Age and sex appropriate education and counseling updated with regular exercise and diet Referrals for preventative services - none needed Immunizations addressed - covid booster, flu shot Smoking counseling  - none needed Evidence for depression or other mood disorder - stable depression Most recent labs reviewed. I have personally reviewed and have noted: 1) the patient's medical and social history 2) The patient's current medications and supplements 3) The patient's height, weight, and BMI have been recorded in the chart

## 2022-06-09 NOTE — Assessment & Plan Note (Signed)
Pt with high suspicion for uti, for urine and culture, also empiric cephalexin 500 tid

## 2022-06-09 NOTE — Assessment & Plan Note (Signed)
Lab Results  Component Value Date   LDLCALC 73 06/08/2022   Stable, pt to continue current statin low chol diet

## 2022-06-09 NOTE — Assessment & Plan Note (Signed)
Lab Results  Component Value Date   HGBA1C 5.6 06/08/2022   Stable, pt to continue current medical treatment  - diet, wt control, excercise

## 2022-06-09 NOTE — Assessment & Plan Note (Signed)
Stable, declines need for change in tx or referral,  to f/u any worsening symptoms or concerns

## 2022-06-09 NOTE — Assessment & Plan Note (Signed)
Last vitamin D Lab Results  Component Value Date   VD25OH 14.27 (L) 06/08/2022   Low, to start oral replacement

## 2022-06-10 LAB — URINE CULTURE: Result:: NO GROWTH

## 2022-06-11 ENCOUNTER — Other Ambulatory Visit (HOSPITAL_COMMUNITY): Payer: Self-pay

## 2022-06-11 ENCOUNTER — Encounter: Payer: BC Managed Care – PPO | Admitting: Internal Medicine

## 2022-06-11 MED ORDER — ZOSTER VAC RECOMB ADJUVANTED 50 MCG/0.5ML IM SUSR
INTRAMUSCULAR | 0 refills | Status: DC
  Filled 2022-08-13: qty 0.5, fill #0
  Filled 2022-10-23: qty 0.5, 1d supply, fill #0

## 2022-06-12 ENCOUNTER — Telehealth: Payer: Self-pay

## 2022-06-12 NOTE — Telephone Encounter (Signed)
PA started  Key: [employer]

## 2022-06-20 NOTE — Telephone Encounter (Signed)
[place] with [employer] called back and said that the auth was denied, they also sent a fax today. All info should be on the fax.

## 2022-06-28 ENCOUNTER — Other Ambulatory Visit (HOSPITAL_COMMUNITY): Payer: Self-pay

## 2022-07-03 ENCOUNTER — Other Ambulatory Visit (HOSPITAL_COMMUNITY): Payer: Self-pay

## 2022-07-06 ENCOUNTER — Other Ambulatory Visit (HOSPITAL_COMMUNITY): Payer: Self-pay

## 2022-07-31 ENCOUNTER — Other Ambulatory Visit (HOSPITAL_COMMUNITY): Payer: Self-pay

## 2022-07-31 ENCOUNTER — Ambulatory Visit (INDEPENDENT_AMBULATORY_CARE_PROVIDER_SITE_OTHER): Payer: BC Managed Care – PPO | Admitting: Internal Medicine

## 2022-07-31 ENCOUNTER — Encounter: Payer: Self-pay | Admitting: Internal Medicine

## 2022-07-31 VITALS — BP 120/80 | HR 87 | Temp 99.8°F | Ht 67.0 in | Wt 183.0 lb

## 2022-07-31 DIAGNOSIS — J069 Acute upper respiratory infection, unspecified: Secondary | ICD-10-CM | POA: Insufficient documentation

## 2022-07-31 LAB — POC COVID19 BINAXNOW: SARS Coronavirus 2 Ag: NEGATIVE

## 2022-07-31 MED ORDER — PROMETHAZINE-DM 6.25-15 MG/5ML PO SYRP
5.0000 mL | ORAL_SOLUTION | Freq: Four times a day (QID) | ORAL | 0 refills | Status: DC | PRN
  Filled 2022-07-31: qty 118, 6d supply, fill #0

## 2022-07-31 MED ORDER — FLUTICASONE PROPIONATE 50 MCG/ACT NA SUSP
2.0000 | Freq: Every day | NASAL | 6 refills | Status: DC
  Filled 2022-07-31: qty 16, 30d supply, fill #0
  Filled 2022-10-23: qty 16, 30d supply, fill #1

## 2022-07-31 NOTE — Patient Instructions (Signed)
We have sent in flonase to use two spray in the nose up to twice a day.   We have sent in cough medicine to use up to 3 times a day for cough.

## 2022-07-31 NOTE — Assessment & Plan Note (Addendum)
Due to multiple circulating pathogens did POC covid-19 testing in the office. This will help determine treatment and quarantine period. Covid-19 POC negative and advised to do home test tomorrow morning to help assess and potentially Thursday morning as well. Rx promethazine/dm and flonase. Work note done. Advised likely viral.

## 2022-07-31 NOTE — Progress Notes (Signed)
   Subjective:   Patient ID: [name], female    DOB: 1970-09-17, 51 y.o.   MRN: [id number]  HPI The patient is a 51 YO female coming in for fever, sore throat, sinus drainage 3 days ago. 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 Negative for fatigue and unexpected weight change.  HENT:  Positive for congestion, postnasal drip, rhinorrhea and sinus pressure. Negative for ear discharge, ear pain, sinus pain, sneezing, sore throat, tinnitus, trouble swallowing and voice change.   Eyes: Negative.   Respiratory:  Positive for cough. Negative for chest tightness, shortness of breath and wheezing.   Cardiovascular: Negative.   Gastrointestinal: Negative.   Musculoskeletal:  Positive for myalgias.  Neurological: Negative.     Objective:  Physical Exam Constitutional:      Appearance: She is well-developed.  HENT:     Head: Normocephalic and atraumatic.     Comments: Oropharynx with redness and clear drainage, nose with swollen turbinates, TMs normal bilaterally.  Neck:     Thyroid: No thyromegaly.  Cardiovascular:     Rate and Rhythm: Normal rate and regular rhythm.  Pulmonary:     Effort: Pulmonary effort is normal. No respiratory distress.     Breath sounds: Normal breath sounds. No wheezing or rales.  Abdominal:     Palpations: Abdomen is soft.  Musculoskeletal:        General: Tenderness present.     Cervical back: Normal range of motion.  Lymphadenopathy:     Cervical: No cervical adenopathy.  Skin:    General: Skin is warm and dry.  Neurological:     Mental Status: She is alert and oriented to person, place, and time.     Vitals:   07/31/22 0842  BP: 120/80  Pulse: 87  Temp: 99.8 F (37.7 C)  TempSrc: Oral  SpO2: 92%  Weight: 183 lb (83 kg)  Height: '5\' 7"'$  (1.702 m)    Assessment & Plan:

## 2022-08-01 ENCOUNTER — Encounter: Payer: Self-pay | Admitting: Internal Medicine

## 2022-08-01 DIAGNOSIS — R042 Hemoptysis: Secondary | ICD-10-CM

## 2022-08-02 ENCOUNTER — Telehealth: Payer: Self-pay | Admitting: Internal Medicine

## 2022-08-02 NOTE — Telephone Encounter (Signed)
Patient called back and is very upset - she wants something to soothe her throat so she can get some sleep - please advise.

## 2022-08-03 ENCOUNTER — Other Ambulatory Visit (HOSPITAL_COMMUNITY): Payer: Self-pay

## 2022-08-03 MED ORDER — AZITHROMYCIN 250 MG PO TABS
ORAL_TABLET | ORAL | 1 refills | Status: AC
  Filled 2022-08-03: qty 6, 5d supply, fill #0

## 2022-08-03 NOTE — Addendum Note (Signed)
Addended by: Biagio Borg on: 08/03/2022 09:35 AM   Modules accepted: Orders

## 2022-08-03 NOTE — Telephone Encounter (Signed)
Ok I sent zpack to [facility] pharmacy

## 2022-08-13 ENCOUNTER — Other Ambulatory Visit (HOSPITAL_COMMUNITY): Payer: Self-pay

## 2022-08-14 ENCOUNTER — Other Ambulatory Visit (HOSPITAL_COMMUNITY): Payer: Self-pay

## 2022-10-17 ENCOUNTER — Ambulatory Visit: Payer: BC Managed Care – PPO | Admitting: Internal Medicine

## 2022-10-22 ENCOUNTER — Ambulatory Visit: Payer: BC Managed Care – PPO | Admitting: Internal Medicine

## 2022-10-23 ENCOUNTER — Other Ambulatory Visit (HOSPITAL_COMMUNITY): Payer: Self-pay

## 2022-10-23 ENCOUNTER — Other Ambulatory Visit: Payer: Self-pay

## 2022-10-26 ENCOUNTER — Other Ambulatory Visit (HOSPITAL_COMMUNITY): Payer: Self-pay

## 2022-10-31 DIAGNOSIS — H25813 Combined forms of age-related cataract, bilateral: Secondary | ICD-10-CM | POA: Diagnosis not present

## 2022-11-12 DIAGNOSIS — H25041 Posterior subcapsular polar age-related cataract, right eye: Secondary | ICD-10-CM | POA: Diagnosis not present

## 2022-11-15 ENCOUNTER — Other Ambulatory Visit: Payer: Self-pay

## 2022-11-20 ENCOUNTER — Other Ambulatory Visit: Payer: Self-pay

## 2022-11-20 ENCOUNTER — Other Ambulatory Visit (HOSPITAL_COMMUNITY): Payer: Self-pay

## 2022-11-20 MED ORDER — KETOROLAC TROMETHAMINE 0.5 % OP SOLN
OPHTHALMIC | 0 refills | Status: DC
  Filled 2022-11-20: qty 5, 20d supply, fill #0

## 2022-11-20 MED ORDER — OFLOXACIN 0.3 % OP SOLN
OPHTHALMIC | 0 refills | Status: DC
  Filled 2022-11-20: qty 5, 14d supply, fill #0

## 2022-11-20 MED ORDER — PREDNISOLONE ACETATE 1 % OP SUSP
OPHTHALMIC | 0 refills | Status: DC
  Filled 2022-11-20: qty 5, 14d supply, fill #0

## 2022-11-22 DIAGNOSIS — H269 Unspecified cataract: Secondary | ICD-10-CM | POA: Diagnosis not present

## 2022-11-22 DIAGNOSIS — H25041 Posterior subcapsular polar age-related cataract, right eye: Secondary | ICD-10-CM | POA: Diagnosis not present

## 2022-11-22 DIAGNOSIS — H2511 Age-related nuclear cataract, right eye: Secondary | ICD-10-CM | POA: Diagnosis not present

## 2022-12-31 ENCOUNTER — Other Ambulatory Visit (HOSPITAL_COMMUNITY): Payer: Self-pay

## 2022-12-31 MED ORDER — PREDNISOLONE ACETATE 1 % OP SUSP
OPHTHALMIC | 0 refills | Status: DC
  Filled 2022-12-31: qty 5, 20d supply, fill #0

## 2023-01-22 ENCOUNTER — Other Ambulatory Visit (HOSPITAL_COMMUNITY): Payer: Self-pay

## 2023-01-22 ENCOUNTER — Ambulatory Visit (INDEPENDENT_AMBULATORY_CARE_PROVIDER_SITE_OTHER): Payer: BC Managed Care – PPO | Admitting: Family Medicine

## 2023-01-22 ENCOUNTER — Encounter: Payer: Self-pay | Admitting: Family Medicine

## 2023-01-22 VITALS — BP 122/78 | HR 90 | Temp 97.8°F | Wt 182.8 lb

## 2023-01-22 DIAGNOSIS — R110 Nausea: Secondary | ICD-10-CM | POA: Diagnosis not present

## 2023-01-22 DIAGNOSIS — R0981 Nasal congestion: Secondary | ICD-10-CM

## 2023-01-22 MED ORDER — FLUTICASONE PROPIONATE 50 MCG/ACT NA SUSP
2.0000 | Freq: Every day | NASAL | 6 refills | Status: DC
  Filled 2023-01-22: qty 16, 30d supply, fill #0

## 2023-01-22 MED ORDER — ONDANSETRON HCL 4 MG PO TABS
4.0000 mg | ORAL_TABLET | Freq: Three times a day (TID) | ORAL | 0 refills | Status: DC | PRN
  Filled 2023-01-22: qty 20, 7d supply, fill #0

## 2023-01-22 MED ORDER — IBUPROFEN 600 MG PO TABS
600.0000 mg | ORAL_TABLET | Freq: Three times a day (TID) | ORAL | 0 refills | Status: DC | PRN
  Filled 2023-01-22: qty 30, 10d supply, fill #0

## 2023-01-22 NOTE — Progress Notes (Signed)
Assessment/Plan:   Problem List Items Addressed This Visit   None   There are no discontinued medications.  No follow-ups on file.    Subjective:   Encounter date: 01/22/2023  [name] is a 52 y.o. female who has HYPERTHYROIDISM; Hypothyroidism; Depression; INSOMNIA; TRANSAMINASES, SERUM, ELEVATED; Preventative health care; Bilateral bunions; Right ankle pain; Umbilical pain; Irregular menses; Lower back pain; Diarrhea; Left medial knee pain; Left elbow pain; Acute medial meniscal tear; Triceps tendinitis; RLQ abdominal pain; Right lumbar radiculopathy; HNP (herniated nucleus pulposus), lumbar; Spinal stenosis of lumbar region; Chronic venous insufficiency; Hyperglycemia; Daytime sleepiness; Lower abdominal pain; Menopausal symptoms; HLD (hyperlipidemia); Vitamin D deficiency; Allergic rhinitis; Acute sinus infection; Dysuria; Seasonal allergic rhinitis due to pollen; Eustachian tube dysfunction, bilateral; Major depressive disorder, recurrent severe without psychotic features (HCC); Anxiety disorder, unspecified; Trigeminal neuralgia; Bunion of great toe of right foot; Encounter for well adult exam with abnormal findings; Foul smelling urine; Cobalamin deficiency; Herpes simplex type 1 infection; Neck pain; Obesity (BMI 30-39.9); and Viral URI on their problem list..   She  has a past medical history of Abdominal pain, epigastric (06/19/2010), Allergic rhinitis (07/09/2018), Allergy, Cancer ([facility]), DEPRESSION (05/02/2010), HLD (hyperlipidemia) (07/09/2018), HYPERTHYROIDISM (05/02/2010), HYPOTHYROIDISM (05/02/2010), INSOMNIA (05/02/2010), NAUSEA (06/19/2010), and TRANSAMINASES, SERUM, ELEVATED (05/02/2010)..   She presents with chief complaint of Sinus Problem (Chills , mucus drainage, and nasal pressure. Fever 99.9 and 100 at home x 1 day. Declined testing. ) .   HPI:   ROS  Past Surgical History:  Procedure Laterality Date  . APPENDECTOMY    . CESAREAN SECTION     x 2  .  CHOLECYSTECTOMY  2011  . LUMBAR LAMINECTOMY/DECOMPRESSION MICRODISCECTOMY N/A 01/13/2016   Procedure: MICRO LUMBAR DECOMPRESSION L5-S1, L4-L5;  Surgeon: Jene Every, MD;  Location: [facility];  Service: Orthopedics;  Laterality: N/A;  . s/p compound fx left femur s/p rod, now removed    . TUBAL LIGATION      Outpatient Medications Prior to Visit  Medication Sig Dispense Refill  . fluticasone (FLONASE) 50 MCG/ACT nasal spray Place 2 sprays into both nostrils daily. 16 g 6  . ketorolac (ACULAR) 0.5 % ophthalmic solution Place 1 drop into right eye 3 times a day for 1 week, 2 times a day for 1 week, then daily for 14 days starting the next day after surgery 5 mL 0  . levothyroxine (SYNTHROID) 112 MCG tablet TAKE 1 TABLET BY MOUTH ONCE DAILY BEFORE BREAKFAST 90 tablet 3  . ofloxacin (OCUFLOX) 0.3 % ophthalmic solution Place 1 drop into right eye 3 times a day for 1 week , THEN 2 times a day for 1 week THEN once a day for 14 days---starting the next day after surgery 5 mL 0  . prednisoLONE acetate (PRED FORTE) 1 % ophthalmic suspension Place one drop in right eye 4 times a day for 1 month. 5 mL 0  . Semaglutide-Weight Management (WEGOVY) 1 MG/0.5ML SOAJ Inject 1 mg into the skin once a week. 6 mL 3  . Zoster Vaccine Adjuvanted [facility]) injection Inject into the muscle. 0.5 mL 0  . promethazine-dextromethorphan (PROMETHAZINE-DM) 6.25-15 MG/5ML syrup Take 5 mLs by mouth 4 (four) times daily as needed. (Patient not taking: Reported on 01/22/2023) 118 mL 0   No facility-administered medications prior to visit.    Family History  Problem Relation Age of Onset  . Heart disease Other   . Hypertension Other   . Hyperlipidemia Other   . Stroke Other   . Diabetes Other   .  COPD Other   . Stroke Mother   . Depression Mother   . Alcohol abuse Mother   . Alcohol abuse Father   . Heart disease Father   . Hyperlipidemia Father   . Hypertension Father   . Diabetes Father   . COPD Other   . Cancer  Paternal Uncle        mets all over   . Breast cancer Other        M. Great grandmother    Social History   Socioeconomic History  . Marital status: Married    Spouse name: Not on file  . Number of children: 3  . Years of education: Not on file  . Highest education level: Not on file  Occupational History  . Occupation: Art therapist since 1999  Tobacco Use  . Smoking status: Never  . Smokeless tobacco: Never  Substance and Sexual Activity  . Alcohol use: Yes    Alcohol/week: 0.0 standard drinks of alcohol    Comment: Occ- wine   . Drug use: No  . Sexual activity: Not on file  Other Topics Concern  . Not on file  Social History Narrative   3 biological children, 1 adopted   Social Determinants of Health   Financial Resource Strain: Not on file  Food Insecurity: Not on file  Transportation Needs: Not on file  Physical Activity: Not on file  Stress: Not on file  Social Connections: Not on file  Intimate Partner Violence: Not on file                                                                                                  Objective:  Physical Exam: BP 122/78 (BP Location: Left Arm, Patient Position: Sitting, Cuff Size: Large)   Pulse 90   Temp 97.8 F (36.6 C) (Temporal)   Wt 182 lb 12.8 oz (82.9 kg)   LMP 01/02/2016   SpO2 99%   BMI 28.63 kg/m     Physical Exam  No results found.  No results found for this or any previous visit (from the past 2160 hour(s)).      Garner Nash, MD, MS

## 2023-01-22 NOTE — Patient Instructions (Addendum)
Continue to monitor your symptoms for any changes or worsening, particularly fever, drainage, and pressure. Use the Fluticasone nasal spray as directed to help reduce nasal congestion and inflammation. Take Ibuprofen as needed to help with pain and fever, following the dosage instructions provided. Use saline nasal rinses to help clear nasal passages and alleviate some of the pressure and congestion. Use zofran as needed for nausea If your symptoms do not improve within a week to 10 days, or if you experience worsening symptoms such as high fever or severe headaches, please return for a follow-up appointment.

## 2023-01-23 DIAGNOSIS — R0981 Nasal congestion: Secondary | ICD-10-CM | POA: Insufficient documentation

## 2023-01-23 NOTE — Assessment & Plan Note (Signed)
The patient presents with symptoms of nasal congestion, sinus pressure, sore throat, and nausea starting from last night. She has taken Claritin and Tylenol but not currently using Flonase. Symptoms are likely due to a viral upper respiratory infection.  Plan:  Prescribe fluticasone (Flonase) nasal spray to reduce nasal inflammation. Recommend ibuprofen for pain and inflammation. Prescribe ondansetron (Zofran) for nausea. Advise the patient to use saline nasal rinses to help clear nasal passages. Suggest supportive care and monitoring for progression of symptoms; if not resolved within a week to ten days or if symptoms worsen, further evaluation may be necessary. Educate on the signs that might indicate a need for antibiotics and advise follow-up if such symptoms occur.

## 2023-02-25 ENCOUNTER — Other Ambulatory Visit (HOSPITAL_COMMUNITY): Payer: Self-pay

## 2023-02-28 DIAGNOSIS — Z961 Presence of intraocular lens: Secondary | ICD-10-CM | POA: Diagnosis not present

## 2023-02-28 DIAGNOSIS — H26491 Other secondary cataract, right eye: Secondary | ICD-10-CM | POA: Diagnosis not present

## 2023-02-28 DIAGNOSIS — H2512 Age-related nuclear cataract, left eye: Secondary | ICD-10-CM | POA: Diagnosis not present

## 2023-03-01 ENCOUNTER — Other Ambulatory Visit (HOSPITAL_COMMUNITY): Payer: Self-pay

## 2023-03-14 DIAGNOSIS — R635 Abnormal weight gain: Secondary | ICD-10-CM | POA: Diagnosis not present

## 2023-03-14 DIAGNOSIS — E7800 Pure hypercholesterolemia, unspecified: Secondary | ICD-10-CM | POA: Diagnosis not present

## 2023-03-14 DIAGNOSIS — Z131 Encounter for screening for diabetes mellitus: Secondary | ICD-10-CM | POA: Diagnosis not present

## 2023-03-14 DIAGNOSIS — Z6828 Body mass index (BMI) 28.0-28.9, adult: Secondary | ICD-10-CM | POA: Diagnosis not present

## 2023-03-14 DIAGNOSIS — E039 Hypothyroidism, unspecified: Secondary | ICD-10-CM | POA: Diagnosis not present

## 2023-03-14 DIAGNOSIS — N951 Menopausal and female climacteric states: Secondary | ICD-10-CM | POA: Diagnosis not present

## 2023-03-14 DIAGNOSIS — R7989 Other specified abnormal findings of blood chemistry: Secondary | ICD-10-CM | POA: Diagnosis not present

## 2023-04-02 ENCOUNTER — Other Ambulatory Visit (HOSPITAL_COMMUNITY): Payer: Self-pay

## 2023-04-02 DIAGNOSIS — N898 Other specified noninflammatory disorders of vagina: Secondary | ICD-10-CM | POA: Diagnosis not present

## 2023-04-02 DIAGNOSIS — R232 Flushing: Secondary | ICD-10-CM | POA: Diagnosis not present

## 2023-04-02 DIAGNOSIS — N951 Menopausal and female climacteric states: Secondary | ICD-10-CM | POA: Diagnosis not present

## 2023-04-02 DIAGNOSIS — Z6828 Body mass index (BMI) 28.0-28.9, adult: Secondary | ICD-10-CM | POA: Diagnosis not present

## 2023-04-02 MED ORDER — ESTRADIOL 0.0375 MG/24HR TD PTTW
1.0000 | MEDICATED_PATCH | TRANSDERMAL | 1 refills | Status: DC
  Filled 2023-04-02: qty 8, 28d supply, fill #0
  Filled 2023-04-25: qty 8, 28d supply, fill #1

## 2023-04-02 MED ORDER — PROGESTERONE MICRONIZED 100 MG PO CAPS
100.0000 mg | ORAL_CAPSULE | Freq: Every day | ORAL | 1 refills | Status: DC
  Filled 2023-04-02: qty 30, 30d supply, fill #0
  Filled 2023-04-25: qty 30, 30d supply, fill #1

## 2023-04-16 ENCOUNTER — Other Ambulatory Visit (HOSPITAL_COMMUNITY): Payer: Self-pay

## 2023-05-16 ENCOUNTER — Other Ambulatory Visit (HOSPITAL_COMMUNITY): Payer: Self-pay

## 2023-05-16 ENCOUNTER — Other Ambulatory Visit: Payer: Self-pay

## 2023-05-16 DIAGNOSIS — N951 Menopausal and female climacteric states: Secondary | ICD-10-CM | POA: Diagnosis not present

## 2023-05-16 MED ORDER — PROGESTERONE MICRONIZED 100 MG PO CAPS
100.0000 mg | ORAL_CAPSULE | Freq: Every evening | ORAL | 3 refills | Status: DC
  Filled 2023-05-16 – 2023-05-29 (×2): qty 30, 30d supply, fill #0
  Filled 2023-06-26: qty 30, 30d supply, fill #1
  Filled 2023-07-22: qty 30, 30d supply, fill #2
  Filled 2023-08-13 – 2023-08-20 (×3): qty 30, 30d supply, fill #3

## 2023-05-16 MED ORDER — ESTRADIOL 0.05 MG/24HR TD PTTW
1.0000 | MEDICATED_PATCH | TRANSDERMAL | 3 refills | Status: DC
  Filled 2023-05-16: qty 8, 28d supply, fill #0
  Filled 2023-06-17: qty 8, 28d supply, fill #1
  Filled 2023-07-22: qty 8, 28d supply, fill #2
  Filled 2023-08-13 – 2023-08-20 (×3): qty 8, 28d supply, fill #3

## 2023-05-20 ENCOUNTER — Other Ambulatory Visit (HOSPITAL_COMMUNITY): Payer: Self-pay

## 2023-05-30 ENCOUNTER — Other Ambulatory Visit: Payer: Self-pay

## 2023-05-30 ENCOUNTER — Other Ambulatory Visit (HOSPITAL_COMMUNITY): Payer: Self-pay

## 2023-05-31 ENCOUNTER — Other Ambulatory Visit (HOSPITAL_COMMUNITY): Payer: Self-pay

## 2023-06-10 ENCOUNTER — Encounter: Payer: Self-pay | Admitting: Internal Medicine

## 2023-06-10 ENCOUNTER — Other Ambulatory Visit (HOSPITAL_COMMUNITY): Payer: Self-pay

## 2023-06-10 ENCOUNTER — Ambulatory Visit (INDEPENDENT_AMBULATORY_CARE_PROVIDER_SITE_OTHER): Payer: BC Managed Care – PPO | Admitting: Internal Medicine

## 2023-06-10 VITALS — BP 122/76 | HR 55 | Temp 98.2°F | Ht 67.0 in | Wt 193.0 lb

## 2023-06-10 DIAGNOSIS — E785 Hyperlipidemia, unspecified: Secondary | ICD-10-CM

## 2023-06-10 DIAGNOSIS — L989 Disorder of the skin and subcutaneous tissue, unspecified: Secondary | ICD-10-CM

## 2023-06-10 DIAGNOSIS — Z23 Encounter for immunization: Secondary | ICD-10-CM

## 2023-06-10 DIAGNOSIS — R739 Hyperglycemia, unspecified: Secondary | ICD-10-CM | POA: Diagnosis not present

## 2023-06-10 DIAGNOSIS — Z0001 Encounter for general adult medical examination with abnormal findings: Secondary | ICD-10-CM

## 2023-06-10 DIAGNOSIS — E559 Vitamin D deficiency, unspecified: Secondary | ICD-10-CM

## 2023-06-10 DIAGNOSIS — E538 Deficiency of other specified B group vitamins: Secondary | ICD-10-CM

## 2023-06-10 DIAGNOSIS — E669 Obesity, unspecified: Secondary | ICD-10-CM

## 2023-06-10 DIAGNOSIS — R1030 Lower abdominal pain, unspecified: Secondary | ICD-10-CM | POA: Diagnosis not present

## 2023-06-10 DIAGNOSIS — E039 Hypothyroidism, unspecified: Secondary | ICD-10-CM | POA: Diagnosis not present

## 2023-06-10 LAB — HEPATIC FUNCTION PANEL
ALT: 18 U/L (ref 0–35)
AST: 18 U/L (ref 0–37)
Albumin: 4.3 g/dL (ref 3.5–5.2)
Alkaline Phosphatase: 71 U/L (ref 39–117)
Bilirubin, Direct: 0.1 mg/dL (ref 0.0–0.3)
Total Bilirubin: 0.5 mg/dL (ref 0.2–1.2)
Total Protein: 7.5 g/dL (ref 6.0–8.3)

## 2023-06-10 LAB — CBC WITH DIFFERENTIAL/PLATELET
Basophils Absolute: 0 10*3/uL (ref 0.0–0.1)
Basophils Relative: 0.6 % (ref 0.0–3.0)
Eosinophils Absolute: 0.1 10*3/uL (ref 0.0–0.7)
Eosinophils Relative: 2.5 % (ref 0.0–5.0)
HCT: 42.7 % (ref 36.0–46.0)
Hemoglobin: 13.8 g/dL (ref 12.0–15.0)
Lymphocytes Relative: 42.4 % (ref 12.0–46.0)
Lymphs Abs: 2.2 10*3/uL (ref 0.7–4.0)
MCHC: 32.3 g/dL (ref 30.0–36.0)
MCV: 97.9 fL (ref 78.0–100.0)
Monocytes Absolute: 0.4 10*3/uL (ref 0.1–1.0)
Monocytes Relative: 8.4 % (ref 3.0–12.0)
Neutro Abs: 2.3 10*3/uL (ref 1.4–7.7)
Neutrophils Relative %: 46.1 % (ref 43.0–77.0)
Platelets: 226 10*3/uL (ref 150.0–400.0)
RBC: 4.36 Mil/uL (ref 3.87–5.11)
RDW: 14.8 % (ref 11.5–15.5)
WBC: 5.1 10*3/uL (ref 4.0–10.5)

## 2023-06-10 LAB — LIPID PANEL
Cholesterol: 168 mg/dL (ref 0–200)
HDL: 60.5 mg/dL (ref >39.00)
LDL Cholesterol: 89 mg/dL (ref 0–99)
NonHDL: 107.74
Total CHOL/HDL Ratio: 3
Triglycerides: 95 mg/dL (ref 0.0–149.0)
VLDL: 19 mg/dL (ref 0.0–40.0)

## 2023-06-10 LAB — HEMOGLOBIN A1C: Hgb A1c MFr Bld: 5.6 % (ref 4.6–6.5)

## 2023-06-10 LAB — MICROALBUMIN / CREATININE URINE RATIO
Creatinine,U: 58.9 mg/dL
Microalb Creat Ratio: 1.2 mg/g (ref 0.0–30.0)
Microalb, Ur: <0.7 mg/dL (ref 0.0–1.9)

## 2023-06-10 LAB — BASIC METABOLIC PANEL
BUN: 10 mg/dL (ref 6–23)
CO2: 28 meq/L (ref 19–32)
Calcium: 9.5 mg/dL (ref 8.4–10.5)
Chloride: 102 meq/L (ref 96–112)
Creatinine, Ser: 0.72 mg/dL (ref 0.40–1.20)
GFR: 95.97 mL/min (ref >60.00)
Glucose, Bld: 100 mg/dL — ABNORMAL HIGH (ref 70–99)
Potassium: 4.3 meq/L (ref 3.5–5.1)
Sodium: 136 meq/L (ref 135–145)

## 2023-06-10 LAB — URINALYSIS, ROUTINE W REFLEX MICROSCOPIC
Bilirubin Urine: NEGATIVE
Hgb urine dipstick: NEGATIVE
Ketones, ur: NEGATIVE
Leukocytes,Ua: NEGATIVE
Nitrite: NEGATIVE
Specific Gravity, Urine: 1.02 (ref 1.000–1.030)
Total Protein, Urine: NEGATIVE
Urine Glucose: NEGATIVE
Urobilinogen, UA: 0.2 (ref 0.0–1.0)
pH: 7 (ref 5.0–8.0)

## 2023-06-10 LAB — VITAMIN B12: Vitamin B-12: 600 pg/mL (ref 211–911)

## 2023-06-10 LAB — TSH: TSH: 1.32 u[IU]/mL (ref 0.35–5.50)

## 2023-06-10 LAB — VITAMIN D 25 HYDROXY (VIT D DEFICIENCY, FRACTURES): VITD: 48.46 ng/mL (ref 30.00–100.00)

## 2023-06-10 MED ORDER — TOPIRAMATE 50 MG PO TABS
50.0000 mg | ORAL_TABLET | Freq: Two times a day (BID) | ORAL | 5 refills | Status: DC
  Filled 2023-06-10: qty 60, 30d supply, fill #0

## 2023-06-10 MED ORDER — PHENTERMINE HCL 30 MG PO CAPS
30.0000 mg | ORAL_CAPSULE | ORAL | 5 refills | Status: DC
  Filled 2023-06-10: qty 30, 30d supply, fill #0

## 2023-06-10 NOTE — Progress Notes (Signed)
Patient ID: [name], female   DOB: 11-May-1971, 52 y.o.   MRN: [id number]         Chief Complaint:: wellness exam and Annual Exam (Concerns about weight, states she is on hormones , concerned about the weight around lower abdomen )  Skin tag, lower abd pain, obesity       HPI:  [name] is a 52 y.o. female here for wellness exam; plans to see new GYN soon and mammogram, also having HRT per other provider,  o/w up to date                        Also Pt denies chest pain, increased sob or doe, wheezing, orthopnea, PND, increased LE swelling, palpitations, dizziness or syncope.   Pt denies polydipsia, polyuria, or new focal neuro s/s.    Pt denies fever, wt loss, night sweats, loss of appetite, or other constitutional symptoms  Denies worsening reflux, dysphagia, n/v, bowel change or blood but has had mild lower abd dull pain mild intermittent for 1 wk.  Also has several skin tags, asks for derm referral.  Also unable to lose significant wt despite attention to diet, exercise.     Wt Readings from Last 3 Encounters:  06/10/23 193 lb (87.5 kg)  01/22/23 182 lb 12.8 oz (82.9 kg)  07/31/22 183 lb (83 kg)   BP Readings from Last 3 Encounters:  06/10/23 122/76  01/22/23 122/78  07/31/22 120/80   Immunization History  Administered Date(s) Administered   Influenza Split 04/26/2011   Influenza Whole 05/02/2010   Influenza, Seasonal, Injecte, Preservative Fre 06/10/2023   Influenza,inj,Quad PF,6+ Mos 06/07/2015, 05/31/2017, 07/09/2018, 06/25/2019   PFIZER(Purple Top)SARS-COV-2 Vaccination 04/22/2020   Td 04/13/2004   Tdap 11/26/2013   Zoster Recombinant(Shingrix) 06/11/2022, 08/20/2022, 10/31/2022   There are no preventive care reminders to display for this patient.     Past Medical History:  Diagnosis Date   Abdominal pain, epigastric 06/19/2010   Allergic rhinitis 07/09/2018   Allergy    Cancer (HCC)    thyroid cancer    DEPRESSION 05/02/2010   HLD (hyperlipidemia)  07/09/2018   HYPERTHYROIDISM 05/02/2010   HYPOTHYROIDISM 05/02/2010   INSOMNIA 05/02/2010   NAUSEA 06/19/2010   TRANSAMINASES, SERUM, ELEVATED 05/02/2010   Past Surgical History:  Procedure Laterality Date   APPENDECTOMY     CESAREAN SECTION     x 2   CHOLECYSTECTOMY  2011   LUMBAR LAMINECTOMY/DECOMPRESSION MICRODISCECTOMY N/A 01/13/2016   Procedure: MICRO LUMBAR DECOMPRESSION L5-S1, L4-L5;  Surgeon: Jene Every, MD;  Location: [facility];  Service: Orthopedics;  Laterality: N/A;   s/p compound fx left femur s/p rod, now removed     TUBAL LIGATION      reports that she has never smoked. She has never used smokeless tobacco. She reports current alcohol use. She reports that she does not use drugs. family history includes Alcohol abuse in her father and mother; Breast cancer in an other family member; COPD in some other family members; Cancer in her paternal uncle; Depression in her mother; Diabetes in her father and another family member; Heart disease in her father and another family member; Hyperlipidemia in her father and another family member; Hypertension in her father and another family member; Stroke in her mother and another family member. Allergies  Allergen Reactions   Sulfa Antibiotics Hives   Current Outpatient Medications on File Prior to Visit  Medication Sig Dispense Refill   estradiol (VIVELLE-DOT) 0.0375 MG/24HR Place  1 patch onto the skin 2 (two) times a week. 8 patch 1   estradiol (VIVELLE-DOT) 0.05 MG/24HR patch Place 1 patch (0.05 mg total) onto the skin 2 (two) times a week. 8 patch 3   levothyroxine (SYNTHROID) 112 MCG tablet TAKE 1 TABLET BY MOUTH ONCE DAILY BEFORE BREAKFAST 90 tablet 3   progesterone (PROMETRIUM) 100 MG capsule Take 1 capsule (100 mg total) by mouth at bedtime. 30 capsule 3   fluticasone (FLONASE) 50 MCG/ACT nasal spray Place 2 sprays into both nostrils daily. (Patient not taking: Reported on 06/10/2023) 16 g 6   ibuprofen (ADVIL) 600 MG tablet Take 1  tablet (600 mg total) by mouth every 8 (eight) hours as needed. (Patient not taking: Reported on 06/10/2023) 30 tablet 0   ketorolac (ACULAR) 0.5 % ophthalmic solution Place 1 drop into right eye 3 times a day for 1 week, 2 times a day for 1 week, then daily for 14 days starting the next day after surgery (Patient not taking: Reported on 06/10/2023) 5 mL 0   ofloxacin (OCUFLOX) 0.3 % ophthalmic solution Place 1 drop into right eye 3 times a day for 1 week , THEN 2 times a day for 1 week THEN once a day for 14 days---starting the next day after surgery (Patient not taking: Reported on 06/10/2023) 5 mL 0   ondansetron (ZOFRAN) 4 MG tablet Take 1 tablet (4 mg total) by mouth every 8 (eight) hours as needed for nausea or vomiting. (Patient not taking: Reported on 06/10/2023) 20 tablet 0   prednisoLONE acetate (PRED FORTE) 1 % ophthalmic suspension Place one drop in right eye 4 times a day for 1 month. (Patient not taking: Reported on 06/10/2023) 5 mL 0   promethazine-dextromethorphan (PROMETHAZINE-DM) 6.25-15 MG/5ML syrup Take 5 mLs by mouth 4 (four) times daily as needed. (Patient not taking: Reported on 01/22/2023) 118 mL 0   Semaglutide-Weight Management (WEGOVY) 1 MG/0.5ML SOAJ Inject 1 mg into the skin once a week. (Patient not taking: Reported on 06/10/2023) 6 mL 3   Zoster Vaccine Adjuvanted [facility]) injection Inject into the muscle. (Patient not taking: Reported on 06/10/2023) 0.5 mL 0   No current facility-administered medications on file prior to visit.        ROS:  All others reviewed and negative.  Objective        PE:  BP 122/76 (BP Location: Left Arm, Patient Position: Sitting, Cuff Size: Normal)   Pulse (!) 55   Temp 98.2 F (36.8 C) (Oral)   Ht 5\' 7"  (1.702 m)   Wt 193 lb (87.5 kg)   LMP 01/02/2016   SpO2 98%   BMI 30.23 kg/m                 Constitutional: Pt appears in NAD               HENT: Head: NCAT.                Right Ear: External ear normal.                 Left  Ear: External ear normal.                Eyes: . Pupils are equal, round, and reactive to light. Conjunctivae and EOM are normal               Nose: without d/c or deformity  Neck: Neck supple. Gross normal ROM               Cardiovascular: Normal rate and regular rhythm.                 Pulmonary/Chest: Effort normal and breath sounds without rales or wheezing.                Abd:  Soft, NT, ND, + BS, no organomegaly               Neurological: Pt is alert. At baseline orientation, motor grossly intact               Skin: Skin is warm. No rashes, no other new lesions, LE edema - none               Psychiatric: Pt behavior is normal without agitation   Micro: none  Cardiac tracings I have personally interpreted today:  none  Pertinent Radiological findings (summarize): none   Lab Results  Component Value Date   WBC 5.1 06/10/2023   HGB 13.8 06/10/2023   HCT 42.7 06/10/2023   PLT 226.0 06/10/2023   GLUCOSE 100 (H) 06/10/2023   CHOL 168 06/10/2023   TRIG 95.0 06/10/2023   HDL 60.50 06/10/2023   LDLCALC 89 06/10/2023   ALT 18 06/10/2023   AST 18 06/10/2023   NA 136 06/10/2023   K 4.3 06/10/2023   CL 102 06/10/2023   CREATININE 0.72 06/10/2023   BUN 10 06/10/2023   CO2 28 06/10/2023   TSH 1.32 06/10/2023   HGBA1C 5.6 06/10/2023   MICROALBUR <0.7 06/10/2023   Assessment/Plan:  [name] is a 52 y.o. White or Caucasian [1] female with  has a past medical history of Abdominal pain, epigastric (06/19/2010), Allergic rhinitis (07/09/2018), Allergy, Cancer (HCC), DEPRESSION (05/02/2010), HLD (hyperlipidemia) (07/09/2018), HYPERTHYROIDISM (05/02/2010), HYPOTHYROIDISM (05/02/2010), INSOMNIA (05/02/2010), NAUSEA (06/19/2010), and TRANSAMINASES, SERUM, ELEVATED (05/02/2010).  Encounter for well adult exam with abnormal findings Age and sex appropriate education and counseling updated with regular exercise and diet Referrals for preventative services - pt to call for GYN for  pap and mammogram soon, and Hrt management Immunizations addressed - for shingrix today Smoking counseling  - none needed Evidence for depression or other mood disorder - none significant Most recent labs reviewed. I have personally reviewed and have noted: 1) the patient's medical and social history 2) The patient's current medications and supplements 3) The patient's height, weight, and BMI have been recorded in the chart   Vitamin D deficiency Last vitamin D Lab Results  Component Value Date   VD25OH 48.46 06/10/2023   Stable, cont oral replacement   Obesity (BMI 30-39.9) Uncontroleld, pt for phentermine 30  every day and topamax 50 bid  Hypothyroidism Lab Results  Component Value Date   TSH 1.32 06/10/2023   Stable, pt to continue levothyroxine 112 mcg qd   Hyperglycemia Lab Results  Component Value Date   HGBA1C 5.6 06/10/2023   Stable, pt to continue current medical treatment  - diet, wt control   HLD (hyperlipidemia) Lab Results  Component Value Date   LDLCALC 89 06/10/2023   Stable, pt to continue lower chol diet, declines statin for now   Lower abdominal pain Exam benign, for urine cx as well  Skin lesion With several skin tags - for derm referral  Followup: Return in about 1 year (around 06/09/2024).  Oliver Barre, MD 06/14/2023 9:42 PM [facility]  Internal Medicine

## 2023-06-10 NOTE — Progress Notes (Signed)
The test results show that your current treatment is OK, as the tests are stable.  Please continue the same plan.  There is no other need for change of treatment or further evaluation based on these results, at this time.  thanks 

## 2023-06-10 NOTE — Patient Instructions (Signed)
Please take all new medication as prescribed - the phentermine and topamax for weight loss  Please continue all other medications as before, and refills have been done if requested.  Please have the pharmacy call with any other refills you may need.  Please continue your efforts at being more active, low cholesterol diet, and weight control.  You are otherwise up to date with prevention measures today.  Please keep your appointments with your specialists as you may have planned  You will be contacted regarding the referral for: dermatology  Please go to the LAB at the blood drawing area for the tests to be done  You will be contacted by phone if any changes need to be made immediately.  Otherwise, you will receive a letter about your results with an explanation, but please check with MyChart first.  Please make an Appointment to return for your 1 year visit, or sooner if needed

## 2023-06-11 ENCOUNTER — Telehealth: Payer: Self-pay | Admitting: Internal Medicine

## 2023-06-11 LAB — URINE CULTURE: Result:: NO GROWTH

## 2023-06-11 NOTE — Telephone Encounter (Signed)
Placed on providers desk

## 2023-06-11 NOTE — Telephone Encounter (Signed)
Patient dropped off document Physical Screening Validation Form, to be filled out by provider. Patient requested to send it back via email Patient to within 7-days. Document is located in providers tray at front office.Please advise at [facility] [phone]

## 2023-06-12 DIAGNOSIS — Z1151 Encounter for screening for human papillomavirus (HPV): Secondary | ICD-10-CM | POA: Diagnosis not present

## 2023-06-12 DIAGNOSIS — Z1231 Encounter for screening mammogram for malignant neoplasm of breast: Secondary | ICD-10-CM | POA: Diagnosis not present

## 2023-06-12 DIAGNOSIS — Z6828 Body mass index (BMI) 28.0-28.9, adult: Secondary | ICD-10-CM | POA: Diagnosis not present

## 2023-06-12 DIAGNOSIS — Z124 Encounter for screening for malignant neoplasm of cervix: Secondary | ICD-10-CM | POA: Diagnosis not present

## 2023-06-12 DIAGNOSIS — Z01419 Encounter for gynecological examination (general) (routine) without abnormal findings: Secondary | ICD-10-CM | POA: Diagnosis not present

## 2023-06-14 ENCOUNTER — Encounter: Payer: Self-pay | Admitting: Internal Medicine

## 2023-06-14 DIAGNOSIS — L989 Disorder of the skin and subcutaneous tissue, unspecified: Secondary | ICD-10-CM | POA: Insufficient documentation

## 2023-06-14 NOTE — Assessment & Plan Note (Addendum)
Age and sex appropriate education and counseling updated with regular exercise and diet Referrals for preventative services - pt to call for GYN for pap and mammogram soon, and Hrt management Immunizations addressed - for shingrix today Smoking counseling  - none needed Evidence for depression or other mood disorder - none significant Most recent labs reviewed. I have personally reviewed and have noted: 1) the patient's medical and social history 2) The patient's current medications and supplements 3) The patient's height, weight, and BMI have been recorded in the chart

## 2023-06-14 NOTE — Assessment & Plan Note (Signed)
With several skin tags - for derm referral

## 2023-06-14 NOTE — Assessment & Plan Note (Signed)
Lab Results  Component Value Date   HGBA1C 5.6 06/10/2023   Stable, pt to continue current medical treatment  - diet, wt control

## 2023-06-14 NOTE — Assessment & Plan Note (Signed)
Last vitamin D Lab Results  Component Value Date   VD25OH 48.46 06/10/2023   Stable, cont oral replacement

## 2023-06-14 NOTE — Assessment & Plan Note (Signed)
Uncontroleld, pt for phentermine 30  every day and topamax 50 bid

## 2023-06-14 NOTE — Assessment & Plan Note (Signed)
Exam benign, for urine cx as well

## 2023-06-14 NOTE — Assessment & Plan Note (Signed)
Lab Results  Component Value Date   LDLCALC 89 06/10/2023   Stable, pt to continue lower chol diet, declines statin for now

## 2023-06-14 NOTE — Assessment & Plan Note (Signed)
Lab Results  Component Value Date   TSH 1.32 06/10/2023   Stable, pt to continue levothyroxine 112 mcg qd

## 2023-06-17 NOTE — Telephone Encounter (Signed)
Called and let Pt know form is ready for pickup and placed up front.

## 2023-06-18 ENCOUNTER — Other Ambulatory Visit (HOSPITAL_COMMUNITY): Payer: Self-pay

## 2023-06-21 ENCOUNTER — Other Ambulatory Visit (HOSPITAL_COMMUNITY): Payer: Self-pay

## 2023-06-26 ENCOUNTER — Other Ambulatory Visit: Payer: Self-pay

## 2023-06-26 ENCOUNTER — Other Ambulatory Visit: Payer: Self-pay | Admitting: Internal Medicine

## 2023-06-26 ENCOUNTER — Other Ambulatory Visit (HOSPITAL_COMMUNITY): Payer: Self-pay

## 2023-06-26 MED ORDER — LEVOTHYROXINE SODIUM 112 MCG PO TABS
ORAL_TABLET | ORAL | 3 refills | Status: DC
  Filled 2023-06-26: qty 30, 30d supply, fill #0
  Filled 2023-07-22: qty 30, 30d supply, fill #1
  Filled 2023-08-28: qty 30, 30d supply, fill #2
  Filled 2023-09-27: qty 30, 30d supply, fill #3
  Filled 2023-10-30: qty 30, 30d supply, fill #4
  Filled 2023-11-26: qty 30, 30d supply, fill #5
  Filled 2023-12-26: qty 30, 30d supply, fill #6
  Filled 2024-01-23 (×3): qty 30, 30d supply, fill #7

## 2023-06-27 ENCOUNTER — Other Ambulatory Visit (HOSPITAL_COMMUNITY): Payer: Self-pay

## 2023-07-22 ENCOUNTER — Other Ambulatory Visit (HOSPITAL_COMMUNITY): Payer: Self-pay

## 2023-07-24 ENCOUNTER — Other Ambulatory Visit (HOSPITAL_COMMUNITY): Payer: Self-pay

## 2023-08-16 DIAGNOSIS — N951 Menopausal and female climacteric states: Secondary | ICD-10-CM | POA: Diagnosis not present

## 2023-08-16 DIAGNOSIS — E039 Hypothyroidism, unspecified: Secondary | ICD-10-CM | POA: Diagnosis not present

## 2023-08-20 ENCOUNTER — Other Ambulatory Visit (HOSPITAL_COMMUNITY): Payer: Self-pay

## 2023-08-21 DIAGNOSIS — N898 Other specified noninflammatory disorders of vagina: Secondary | ICD-10-CM | POA: Diagnosis not present

## 2023-08-21 DIAGNOSIS — Z6829 Body mass index (BMI) 29.0-29.9, adult: Secondary | ICD-10-CM | POA: Diagnosis not present

## 2023-08-21 DIAGNOSIS — R232 Flushing: Secondary | ICD-10-CM | POA: Diagnosis not present

## 2023-08-21 DIAGNOSIS — N951 Menopausal and female climacteric states: Secondary | ICD-10-CM | POA: Diagnosis not present

## 2023-09-09 DIAGNOSIS — H2512 Age-related nuclear cataract, left eye: Secondary | ICD-10-CM | POA: Diagnosis not present

## 2023-09-09 DIAGNOSIS — H26491 Other secondary cataract, right eye: Secondary | ICD-10-CM | POA: Diagnosis not present

## 2023-09-27 ENCOUNTER — Other Ambulatory Visit (HOSPITAL_COMMUNITY): Payer: Self-pay

## 2023-09-30 ENCOUNTER — Other Ambulatory Visit (HOSPITAL_COMMUNITY): Payer: Self-pay

## 2023-09-30 MED ORDER — PROGESTERONE MICRONIZED 100 MG PO CAPS
100.0000 mg | ORAL_CAPSULE | Freq: Every day | ORAL | 3 refills | Status: AC
  Filled 2023-09-30: qty 30, 30d supply, fill #0

## 2023-09-30 MED ORDER — ESTRADIOL 0.05 MG/24HR TD PTTW
1.0000 | MEDICATED_PATCH | TRANSDERMAL | 3 refills | Status: DC
  Filled 2023-09-30: qty 8, 28d supply, fill #0
  Filled 2023-10-30: qty 8, 28d supply, fill #1

## 2023-10-09 ENCOUNTER — Ambulatory Visit: Payer: Self-pay | Admitting: Internal Medicine

## 2023-10-09 NOTE — Telephone Encounter (Signed)
 Copied from CRM [id number]. Topic: Clinical - Red Word Triage >> Oct 09, 2023  8:12 AM Fredrich Romans wrote: Red Word that prompted transfer to Nurse Triage: panic attack,heart palpitations, diarrhea,lose of appetite  Ongoing for one month  Chief Complaint: Anxiety Symptoms: Anxious, lack of sleep, upset stomach, loose stool Frequency: Ongoing for one month Disposition: [] ED /[] Urgent Care (no appt availability in office) / [x] Appointment(In office/virtual)/ []  [place] [facility]/ [] Home Care/ [] Refused Recommended Disposition /[]  Mobile Bus/ []  Follow-up with PCP Additional Notes: Patient stated she recently got fired from her job one month ago and she is having difficulty coping. She has been having a lot of anxiety due to this. She stated her anxiety is under control at this moment but she knows that she needs help. Appointment scheduled for tomorrow.    Reason for Disposition  Patient sounds very upset or troubled to the triager  Answer Assessment - Initial Assessment Questions 1. CONCERN: "Did anything happen that prompted you to call today?"      Anxiety, recent job loss  2. ANXIETY SYMPTOMS: "Can you describe how you (your loved one; patient) have been feeling?" (e.g., tense, restless, panicky, anxious, keyed up, overwhelmed, sense of impending doom).      Lack of sleep, anxious, pacing, mind racing, upset stomach, hard to eat,  3. ONSET: "How long have you been feeling this way?" (e.g., hours, days, weeks)     A month ago  4. SEVERITY: "How would you rate the level of anxiety?" (e.g., 0 - 10; or mild, moderate, severe).    Under control at the moment  5. FUNCTIONAL IMPAIRMENT: "How have these feelings affected your ability to do daily activities?" "Have you had more difficulty than usual doing your normal daily activities?" (e.g., getting better, same, worse; self-care, school, work, interactions)     Yes, it is interfering with daily activity. Patient doesn't want to do  things that she normally enjoys doing  6. HISTORY: "Have you felt this way before?" "Have you ever been diagnosed with an anxiety problem in the past?" (e.g., generalized anxiety disorder, panic attacks, PTSD). If Yes, ask: "How was this problem treated?" (e.g., medicines, counseling, etc.)     Patient had anxiety before but not on this level  7. RISK OF HARM - SUICIDAL IDEATION: "Do you ever have thoughts of hurting or killing yourself?" If Yes, ask:  "Do you have these feelings now?" "Do you have a plan on how you would do this?"     Patient stated she is not suicidal  8. PATIENT SUPPORT: "Who is with you now?" "Who do you live with?" "Do you have family or friends who you can talk to?"        Patient has been discussing this problem with family  9. OTHER SYMPTOMS: "Do you have any other symptoms?" (e.g., feeling depressed, trouble concentrating, trouble sleeping, trouble breathing, palpitations or fast heartbeat, chest pain, sweating, nausea, or diarrhea)       Heart palpitations sometimes  Protocols used: Anxiety and Panic Attack-A-AH

## 2023-10-10 ENCOUNTER — Encounter: Payer: Self-pay | Admitting: Internal Medicine

## 2023-10-10 ENCOUNTER — Other Ambulatory Visit (HOSPITAL_COMMUNITY): Payer: Self-pay

## 2023-10-10 ENCOUNTER — Ambulatory Visit: Payer: Self-pay | Admitting: Internal Medicine

## 2023-10-10 VITALS — BP 122/80 | HR 85 | Temp 98.2°F | Ht 67.0 in | Wt 183.0 lb

## 2023-10-10 DIAGNOSIS — F419 Anxiety disorder, unspecified: Secondary | ICD-10-CM

## 2023-10-10 DIAGNOSIS — R300 Dysuria: Secondary | ICD-10-CM

## 2023-10-10 DIAGNOSIS — F332 Major depressive disorder, recurrent severe without psychotic features: Secondary | ICD-10-CM

## 2023-10-10 DIAGNOSIS — F32A Depression, unspecified: Secondary | ICD-10-CM

## 2023-10-10 LAB — URINALYSIS, ROUTINE W REFLEX MICROSCOPIC
Bilirubin Urine: NEGATIVE
Ketones, ur: 15 — AB
Leukocytes,Ua: NEGATIVE
Nitrite: NEGATIVE
RBC / HPF: NONE SEEN (ref 0–?)
Specific Gravity, Urine: 1.02 (ref 1.000–1.030)
Total Protein, Urine: NEGATIVE
Urine Glucose: NEGATIVE
Urobilinogen, UA: 0.2 (ref 0.0–1.0)
pH: 6 (ref 5.0–8.0)

## 2023-10-10 MED ORDER — CITALOPRAM HYDROBROMIDE 20 MG PO TABS
20.0000 mg | ORAL_TABLET | Freq: Every day | ORAL | 3 refills | Status: DC
  Filled 2023-10-10: qty 90, 90d supply, fill #0

## 2023-10-10 MED ORDER — CLONAZEPAM 0.5 MG PO TABS
0.5000 mg | ORAL_TABLET | Freq: Two times a day (BID) | ORAL | 1 refills | Status: DC | PRN
  Filled 2023-10-10: qty 60, 30d supply, fill #0

## 2023-10-10 NOTE — Progress Notes (Signed)
 Patient ID: [name], female   DOB: 09-Oct-1970, 53 y.o.   MRN: [id number]        Chief Complaint: follow up after losing her job and dysuria       HPI:  [name] is a 53 y.o. female here after let go from her job, tearful and stressed; Has at least mild worsening depressive symptoms, but no suicidal ideation, or panic; has ongoing anxiety.  Also with 3 days onset mild dysuria, Denies urinary symptoms such as frequency, urgency, flank pain, hematuria or n/v, fever, chills..        Wt Readings from Last 3 Encounters:  10/10/23 183 lb (83 kg)  06/10/23 193 lb (87.5 kg)  01/22/23 182 lb 12.8 oz (82.9 kg)   BP Readings from Last 3 Encounters:  10/10/23 122/80  06/10/23 122/76  01/22/23 122/78         Past Medical History:  Diagnosis Date   Abdominal pain, epigastric 06/19/2010   Allergic rhinitis 07/09/2018   Allergy    Cancer (HCC)    thyroid cancer    DEPRESSION 05/02/2010   HLD (hyperlipidemia) 07/09/2018   HYPERTHYROIDISM 05/02/2010   HYPOTHYROIDISM 05/02/2010   INSOMNIA 05/02/2010   NAUSEA 06/19/2010   TRANSAMINASES, SERUM, ELEVATED 05/02/2010   Past Surgical History:  Procedure Laterality Date   APPENDECTOMY     CESAREAN SECTION     x 2   CHOLECYSTECTOMY  2011   LUMBAR LAMINECTOMY/DECOMPRESSION MICRODISCECTOMY N/A 01/13/2016   Procedure: MICRO LUMBAR DECOMPRESSION L5-S1, L4-L5;  Surgeon: Jene Every, MD;  Location: [facility];  Service: Orthopedics;  Laterality: N/A;   s/p compound fx left femur s/p rod, now removed     TUBAL LIGATION      reports that she has never smoked. She has never used smokeless tobacco. She reports current alcohol use. She reports that she does not use drugs. family history includes Alcohol abuse in her father and mother; Breast cancer in an other family member; COPD in some other family members; Cancer in her paternal uncle; Depression in her mother; Diabetes in her father and another family member; Heart disease in her father and another family  member; Hyperlipidemia in her father and another family member; Hypertension in her father and another family member; Stroke in her mother and another family member. Allergies  Allergen Reactions   Sulfa Antibiotics Hives   Current Outpatient Medications on File Prior to Visit  Medication Sig Dispense Refill   estradiol (VIVELLE-DOT) 0.0375 MG/24HR Place 1 patch onto the skin 2 (two) times a week. 8 patch 1   estradiol (VIVELLE-DOT) 0.05 MG/24HR patch Place 1 patch (0.05 mg total) onto the skin 2 (two) times a week. 8 patch 3   levothyroxine (SYNTHROID) 112 MCG tablet TAKE 1 TABLET BY MOUTH ONCE DAILY BEFORE BREAKFAST 90 tablet 3   progesterone (PROMETRIUM) 100 MG capsule Take 1 capsule (100 mg total) by mouth at bedtime. 30 capsule 3   progesterone (PROMETRIUM) 100 MG capsule Take 1 capsule (100 mg total) by mouth at bedtime. 30 capsule 3   fluticasone (FLONASE) 50 MCG/ACT nasal spray Place 2 sprays into both nostrils daily. (Patient not taking: Reported on 10/10/2023) 16 g 6   ibuprofen (ADVIL) 600 MG tablet Take 1 tablet (600 mg total) by mouth every 8 (eight) hours as needed. (Patient not taking: Reported on 10/10/2023) 30 tablet 0   ketorolac (ACULAR) 0.5 % ophthalmic solution Place 1 drop into right eye 3 times a day for 1 week, 2 times a  day for 1 week, then daily for 14 days starting the next day after surgery (Patient not taking: Reported on 10/10/2023) 5 mL 0   ofloxacin (OCUFLOX) 0.3 % ophthalmic solution Place 1 drop into right eye 3 times a day for 1 week , THEN 2 times a day for 1 week THEN once a day for 14 days---starting the next day after surgery (Patient not taking: Reported on 10/10/2023) 5 mL 0   ondansetron (ZOFRAN) 4 MG tablet Take 1 tablet (4 mg total) by mouth every 8 (eight) hours as needed for nausea or vomiting. (Patient not taking: Reported on 10/10/2023) 20 tablet 0   phentermine 30 MG capsule Take 1 capsule (30 mg total) by mouth every morning. (Patient not taking: Reported  on 10/10/2023) 30 capsule 5   prednisoLONE acetate (PRED FORTE) 1 % ophthalmic suspension Place one drop in right eye 4 times a day for 1 month. (Patient not taking: Reported on 10/10/2023) 5 mL 0   promethazine-dextromethorphan (PROMETHAZINE-DM) 6.25-15 MG/5ML syrup Take 5 mLs by mouth 4 (four) times daily as needed. (Patient not taking: Reported on 10/10/2023) 118 mL 0   Semaglutide-Weight Management (WEGOVY) 1 MG/0.5ML SOAJ Inject 1 mg into the skin once a week. (Patient not taking: Reported on 10/10/2023) 6 mL 3   topiramate (TOPAMAX) 50 MG tablet Take 1 tablet (50 mg total) by mouth 2 (two) times daily. (Patient not taking: Reported on 10/10/2023) 60 tablet 5   Zoster Vaccine Adjuvanted [facility]) injection Inject into the muscle. (Patient not taking: Reported on 10/10/2023) 0.5 mL 0   No current facility-administered medications on file prior to visit.        ROS:  All others reviewed and negative.  Objective        PE:  BP 122/80 (BP Location: Left Arm, Patient Position: Sitting, Cuff Size: Normal)   Pulse 85   Temp 98.2 F (36.8 C) (Oral)   Ht 5\' 7"  (1.702 m)   Wt 183 lb (83 kg)   LMP 01/02/2016   SpO2 97%   BMI 28.66 kg/m                 Constitutional: Pt appears in NAD               HENT: Head: NCAT.                Right Ear: External ear normal.                 Left Ear: External ear normal.                Eyes: . Pupils are equal, round, and reactive to light. Conjunctivae and EOM are normal               Nose: without d/c or deformity               Neck: Neck supple. Gross normal ROM               Cardiovascular: Normal rate and regular rhythm.                 Pulmonary/Chest: Effort normal and breath sounds without rales or wheezing.                Abd:  Soft, NT, ND, + BS, no organomegaly               Neurological: Pt is alert. At baseline orientation, motor grossly intact  Skin: Skin is warm. No rashes, no other new lesions, LE edema - none                Psychiatric: Pt behavior is normal without agitation , tearful nervous depressed  Micro: none  Cardiac tracings I have personally interpreted today:  none  Pertinent Radiological findings (summarize): none   Lab Results  Component Value Date   WBC 5.1 06/10/2023   HGB 13.8 06/10/2023   HCT 42.7 06/10/2023   PLT 226.0 06/10/2023   GLUCOSE 100 (H) 06/10/2023   CHOL 168 06/10/2023   TRIG 95.0 06/10/2023   HDL 60.50 06/10/2023   LDLCALC 89 06/10/2023   ALT 18 06/10/2023   AST 18 06/10/2023   NA 136 06/10/2023   K 4.3 06/10/2023   CL 102 06/10/2023   CREATININE 0.72 06/10/2023   BUN 10 06/10/2023   CO2 28 06/10/2023   TSH 1.32 06/10/2023   HGBA1C 5.6 06/10/2023   MICROALBUR <0.7 06/10/2023   Assessment/Plan:  Kimorah Ridolfi is a 53 y.o. White or Caucasian [1] female with  has a past medical history of Abdominal pain, epigastric (06/19/2010), Allergic rhinitis (07/09/2018), Allergy, Cancer (HCC), DEPRESSION (05/02/2010), HLD (hyperlipidemia) (07/09/2018), HYPERTHYROIDISM (05/02/2010), HYPOTHYROIDISM (05/02/2010), INSOMNIA (05/02/2010), NAUSEA (06/19/2010), and TRANSAMINASES, SERUM, ELEVATED (05/02/2010).  Anxiety disorder, unspecified Ok for klonopin 0.5 bid prn  Depression Mild to mod, for start celexa 20 every day, declines counseling  Dysuria Mild to mod, of unclear clinical signfiicane, for ua and cx, to f/u any worsening symptoms or concerns  Followup: Return if symptoms worsen or fail to improve.  Oliver Barre, MD 10/13/2023 1:28 PM [facility]

## 2023-10-11 LAB — URINE CULTURE: Result:: NO GROWTH

## 2023-10-13 ENCOUNTER — Encounter: Payer: Self-pay | Admitting: Internal Medicine

## 2023-10-13 NOTE — Assessment & Plan Note (Signed)
 Mild to mod, for start celexa 20 every day, declines counseling

## 2023-10-13 NOTE — Patient Instructions (Signed)
 Please take all new medication as prescribed

## 2023-10-13 NOTE — Assessment & Plan Note (Signed)
 Mild to mod, of unclear clinical signfiicane, for ua and cx, to f/u any worsening symptoms or concerns

## 2023-10-13 NOTE — Assessment & Plan Note (Signed)
Ok for klonopin 0.5 bid prn

## 2023-10-18 ENCOUNTER — Telehealth: Payer: Self-pay

## 2023-10-18 ENCOUNTER — Other Ambulatory Visit (HOSPITAL_COMMUNITY): Payer: Self-pay

## 2023-10-18 MED ORDER — BUPROPION HCL ER (XL) 150 MG PO TB24
150.0000 mg | ORAL_TABLET | Freq: Every day | ORAL | 3 refills | Status: DC
  Filled 2023-10-18: qty 90, 90d supply, fill #0

## 2023-10-18 MED ORDER — LORAZEPAM 0.5 MG PO TABS
0.5000 mg | ORAL_TABLET | Freq: Two times a day (BID) | ORAL | 1 refills | Status: DC | PRN
  Filled 2023-10-18: qty 60, 30d supply, fill #0

## 2023-10-18 NOTE — Telephone Encounter (Signed)
 Called and left voice mail

## 2023-10-18 NOTE — Telephone Encounter (Signed)
 Ok to change clonazepam to lorazepam, and also change the celexa to wellbutrin XL 150 mg pe rday - done erx

## 2023-10-18 NOTE — Telephone Encounter (Signed)
 Copied from CRM [id number]. Topic: Clinical - Prescription Issue >> Oct 18, 2023  1:20 PM Kathryne Eriksson wrote: Reason for CRM: clonazePAM (KLONOPIN) 0.5 MG tablet , citalopram (CELEXA) 20 MG tablet >> Oct 18, 2023  1:27 PM Kathryne Eriksson wrote: Patient states she's unable to take these medications, the side effect is extremely horrible. It's making her nauseous, dizzy as well as having headaches. Patient is requesting to have something different in place of them.

## 2023-10-19 ENCOUNTER — Other Ambulatory Visit (HOSPITAL_COMMUNITY): Payer: Self-pay

## 2023-10-22 ENCOUNTER — Ambulatory Visit: Payer: Self-pay | Admitting: Internal Medicine

## 2023-10-22 ENCOUNTER — Other Ambulatory Visit (HOSPITAL_COMMUNITY): Payer: Self-pay

## 2023-10-22 NOTE — Telephone Encounter (Signed)
 Patient called, left VM to return the call to the office to speak to NT.    Copied from CRM [id number]. Topic: Clinical - Prescription Issue >> Oct 22, 2023  4:05 PM [place] J wrote: Reason for CRM: Prescription (buPROPion (WELLBUTRIN XL) 150 MG) was given to patient but patient is saying it's to treat seizures and that's the incorrect medication for her situation. Patient would like a medication for her anxiety. The LORazepam (ATIVAN) 0.5 MG prescribed is something she does not want due to how tired it makes her. She would like a very small dose of xanax.

## 2023-10-22 NOTE — Telephone Encounter (Signed)
 Patient called with concerns about medication that she was switched by Dr. Jonny Ruiz. Patient had been seen and started on Celexa and Klonopin. Patient states medication was too much for her and she requested new medication. Prescriptions for Bupropion and lorazepam were sent in for her. Patient stated"I want something different-Bupropion is used for seizures." Patient was educated on the primary usage of Bupropion-which is for depression and anxiety. Patient appears uptight about medication but states she knows that she needs to sleep at night. Patient is requesting a call from PCP staff tomorrow in regards to medications. No other questions at this time.    Reason for Disposition  Caller has medicine question only, adult not sick, AND triager answers question  Answer Assessment - Initial Assessment Questions 1. NAME of MEDICINE: "What medicine(s) are you calling about?"     Bupropion (Wellbutrin XL) 150 mg and lorazepam 0.5 mg 2. QUESTION: "What is your question?" (e.g., double dose of medicine, side effect)     Patient is questions why Bupropion was sent for her when it is used for seizures. Patient was educated on the use of bupropion for depression and anxiety. Explained that medication is released slowly.  3. PRESCRIBER: "Who prescribed the medicine?" Reason: if prescribed by specialist, call should be referred to that group.     John MD 4. SYMPTOMS: "Do you have any symptoms?" If Yes, ask: "What symptoms are you having?"  "How bad are the symptoms (e.g., mild, moderate, severe)     asymptomatic  Protocols used: Medication Question Call-A-AH

## 2023-10-23 ENCOUNTER — Other Ambulatory Visit (HOSPITAL_COMMUNITY): Payer: Self-pay

## 2023-10-23 NOTE — Telephone Encounter (Signed)
 Ok to let pt know to take HALF of the lorazepam if the 0.5 mg makes her fatigued  Also, it seems she misunderstood about the wellbutrin xl - if you look closely, the medication itself (Not the actual XL one that she would take) lists seizure as a RISK  - not a condition to be treated by the medication  I have to decline to change her medications overall.  Please let he know I can refer to Psychiatry if she likes as a next step.   Thanks

## 2023-10-30 ENCOUNTER — Other Ambulatory Visit (HOSPITAL_COMMUNITY): Payer: Self-pay

## 2023-11-06 ENCOUNTER — Other Ambulatory Visit (HOSPITAL_COMMUNITY): Payer: Self-pay

## 2023-11-06 MED ORDER — PROGESTERONE MICRONIZED 100 MG PO CAPS
100.0000 mg | ORAL_CAPSULE | Freq: Every day | ORAL | 0 refills | Status: DC
  Filled 2023-11-06 – 2023-11-07 (×2): qty 180, 90d supply, fill #0
  Filled ????-??-??: fill #0

## 2023-11-07 ENCOUNTER — Other Ambulatory Visit (HOSPITAL_COMMUNITY): Payer: Self-pay

## 2023-11-16 ENCOUNTER — Other Ambulatory Visit (HOSPITAL_COMMUNITY): Payer: Self-pay

## 2023-11-26 ENCOUNTER — Other Ambulatory Visit: Payer: Self-pay

## 2023-11-26 ENCOUNTER — Other Ambulatory Visit (HOSPITAL_COMMUNITY): Payer: Self-pay

## 2023-12-03 ENCOUNTER — Other Ambulatory Visit (HOSPITAL_COMMUNITY): Payer: Self-pay

## 2023-12-03 MED ORDER — LIOTHYRONINE SODIUM 5 MCG PO TABS
ORAL_TABLET | ORAL | 2 refills | Status: DC
  Filled 2023-12-03: qty 60, 33d supply, fill #0
  Filled 2023-12-14: qty 60, 30d supply, fill #0

## 2023-12-03 MED ORDER — ESTRADIOL 0.0375 MG/24HR TD PTTW
1.0000 | MEDICATED_PATCH | TRANSDERMAL | 1 refills | Status: DC
  Filled 2023-12-03 – 2023-12-14 (×2): qty 8, 28d supply, fill #0
  Filled 2024-01-04 – 2024-01-21 (×2): qty 8, 28d supply, fill #1

## 2023-12-04 ENCOUNTER — Other Ambulatory Visit (HOSPITAL_COMMUNITY): Payer: Self-pay

## 2023-12-04 MED ORDER — ESTRADIOL 0.0375 MG/24HR TD PTTW
1.0000 | MEDICATED_PATCH | TRANSDERMAL | 1 refills | Status: DC
  Filled 2023-12-04: qty 8, 28d supply, fill #0

## 2023-12-13 ENCOUNTER — Other Ambulatory Visit (HOSPITAL_COMMUNITY): Payer: Self-pay

## 2023-12-14 ENCOUNTER — Other Ambulatory Visit (HOSPITAL_COMMUNITY): Payer: Self-pay

## 2023-12-26 ENCOUNTER — Other Ambulatory Visit: Payer: Self-pay

## 2023-12-27 ENCOUNTER — Other Ambulatory Visit: Payer: Self-pay

## 2023-12-31 ENCOUNTER — Other Ambulatory Visit (HOSPITAL_COMMUNITY): Payer: Self-pay

## 2024-01-17 ENCOUNTER — Other Ambulatory Visit (HOSPITAL_COMMUNITY): Payer: Self-pay

## 2024-01-23 ENCOUNTER — Other Ambulatory Visit: Payer: Self-pay

## 2024-01-23 ENCOUNTER — Other Ambulatory Visit (HOSPITAL_COMMUNITY): Payer: Self-pay

## 2024-02-17 ENCOUNTER — Other Ambulatory Visit (HOSPITAL_COMMUNITY): Payer: Self-pay

## 2024-02-17 MED ORDER — PROGESTERONE MICRONIZED 100 MG PO CAPS
100.0000 mg | ORAL_CAPSULE | Freq: Every day | ORAL | 1 refills | Status: DC
  Filled 2024-02-17: qty 180, 90d supply, fill #0

## 2024-02-17 MED ORDER — LEVOTHYROXINE SODIUM 112 MCG PO TABS
112.0000 ug | ORAL_TABLET | Freq: Every morning | ORAL | 1 refills | Status: DC
  Filled 2024-02-17: qty 90, 90d supply, fill #0
  Filled 2024-03-12: qty 90, 90d supply, fill #1

## 2024-02-17 MED ORDER — ESTRADIOL 0.0375 MG/24HR TD PTTW
1.0000 | MEDICATED_PATCH | TRANSDERMAL | 2 refills | Status: DC
  Filled 2024-02-17: qty 8, 28d supply, fill #0

## 2024-02-17 MED ORDER — LIOTHYRONINE SODIUM 5 MCG PO TABS
15.0000 ug | ORAL_TABLET | Freq: Every day | ORAL | 1 refills | Status: DC
  Filled 2024-02-17: qty 270, 90d supply, fill #0

## 2024-02-18 ENCOUNTER — Other Ambulatory Visit (HOSPITAL_COMMUNITY): Payer: Self-pay

## 2024-02-18 ENCOUNTER — Other Ambulatory Visit: Payer: Self-pay

## 2024-02-19 ENCOUNTER — Other Ambulatory Visit: Payer: Self-pay

## 2024-02-19 ENCOUNTER — Other Ambulatory Visit (HOSPITAL_COMMUNITY): Payer: Self-pay

## 2024-03-12 ENCOUNTER — Other Ambulatory Visit (HOSPITAL_COMMUNITY): Payer: Self-pay

## 2024-03-12 MED ORDER — ESTRADIOL 0.0375 MG/24HR TD PTTW
1.0000 | MEDICATED_PATCH | TRANSDERMAL | 0 refills | Status: DC
  Filled 2024-03-12: qty 24, 84d supply, fill #0

## 2024-03-18 ENCOUNTER — Other Ambulatory Visit (HOSPITAL_COMMUNITY): Payer: Self-pay

## 2024-03-20 ENCOUNTER — Other Ambulatory Visit (HOSPITAL_COMMUNITY): Payer: Self-pay

## 2024-03-20 MED ORDER — ESTRADIOL 0.0375 MG/24HR TD PTTW
1.0000 | MEDICATED_PATCH | TRANSDERMAL | 0 refills | Status: DC
  Filled 2024-03-20: qty 8, 28d supply, fill #0

## 2024-03-21 ENCOUNTER — Other Ambulatory Visit (HOSPITAL_COMMUNITY): Payer: Self-pay

## 2024-03-23 ENCOUNTER — Other Ambulatory Visit (HOSPITAL_COMMUNITY): Payer: Self-pay

## 2024-03-24 ENCOUNTER — Other Ambulatory Visit (HOSPITAL_COMMUNITY): Payer: Self-pay

## 2024-05-04 ENCOUNTER — Ambulatory Visit: Payer: Self-pay | Admitting: Dermatology

## 2024-08-11 ENCOUNTER — Encounter: Payer: Self-pay | Admitting: Internal Medicine

## 2024-08-11 ENCOUNTER — Ambulatory Visit: Payer: Self-pay | Admitting: Internal Medicine

## 2024-08-11 VITALS — BP 122/74 | HR 65 | Temp 98.2°F | Ht 67.0 in | Wt 195.0 lb

## 2024-08-11 DIAGNOSIS — E039 Hypothyroidism, unspecified: Secondary | ICD-10-CM | POA: Diagnosis not present

## 2024-08-11 DIAGNOSIS — E559 Vitamin D deficiency, unspecified: Secondary | ICD-10-CM

## 2024-08-11 DIAGNOSIS — E538 Deficiency of other specified B group vitamins: Secondary | ICD-10-CM | POA: Diagnosis not present

## 2024-08-11 DIAGNOSIS — Z23 Encounter for immunization: Secondary | ICD-10-CM

## 2024-08-11 DIAGNOSIS — R233 Spontaneous ecchymoses: Secondary | ICD-10-CM | POA: Diagnosis not present

## 2024-08-11 DIAGNOSIS — E669 Obesity, unspecified: Secondary | ICD-10-CM

## 2024-08-11 DIAGNOSIS — R1030 Lower abdominal pain, unspecified: Secondary | ICD-10-CM | POA: Diagnosis not present

## 2024-08-11 DIAGNOSIS — Z0001 Encounter for general adult medical examination with abnormal findings: Secondary | ICD-10-CM

## 2024-08-11 DIAGNOSIS — R739 Hyperglycemia, unspecified: Secondary | ICD-10-CM

## 2024-08-11 DIAGNOSIS — E785 Hyperlipidemia, unspecified: Secondary | ICD-10-CM | POA: Diagnosis not present

## 2024-08-11 LAB — BASIC METABOLIC PANEL WITH GFR
BUN: 15 mg/dL (ref 6–23)
CO2: 28 meq/L (ref 19–32)
Calcium: 9.7 mg/dL (ref 8.4–10.5)
Chloride: 101 meq/L (ref 96–112)
Creatinine, Ser: 0.71 mg/dL (ref 0.40–1.20)
GFR: 96.79 mL/min
Glucose, Bld: 85 mg/dL (ref 70–99)
Potassium: 4.1 meq/L (ref 3.5–5.1)
Sodium: 138 meq/L (ref 135–145)

## 2024-08-11 LAB — CBC WITH DIFFERENTIAL/PLATELET
Basophils Absolute: 0 K/uL (ref 0.0–0.1)
Basophils Relative: 0.4 % (ref 0.0–3.0)
Eosinophils Absolute: 0.1 K/uL (ref 0.0–0.7)
Eosinophils Relative: 2.1 % (ref 0.0–5.0)
HCT: 42.8 % (ref 36.0–46.0)
Hemoglobin: 14.5 g/dL (ref 12.0–15.0)
Lymphocytes Relative: 35.3 % (ref 12.0–46.0)
Lymphs Abs: 2.1 K/uL (ref 0.7–4.0)
MCHC: 33.8 g/dL (ref 30.0–36.0)
MCV: 96.5 fl (ref 78.0–100.0)
Monocytes Absolute: 0.5 K/uL (ref 0.1–1.0)
Monocytes Relative: 7.8 % (ref 3.0–12.0)
Neutro Abs: 3.3 K/uL (ref 1.4–7.7)
Neutrophils Relative %: 54.4 % (ref 43.0–77.0)
Platelets: 238 K/uL (ref 150.0–400.0)
RBC: 4.43 Mil/uL (ref 3.87–5.11)
RDW: 14.5 % (ref 11.5–15.5)
WBC: 6 K/uL (ref 4.0–10.5)

## 2024-08-11 LAB — LIPID PANEL
Cholesterol: 190 mg/dL (ref 28–200)
HDL: 72.8 mg/dL
LDL Cholesterol: 100 mg/dL — ABNORMAL HIGH (ref 10–99)
NonHDL: 117.25
Total CHOL/HDL Ratio: 3
Triglycerides: 88 mg/dL (ref 10.0–149.0)
VLDL: 17.6 mg/dL (ref 0.0–40.0)

## 2024-08-11 LAB — URINALYSIS, ROUTINE W REFLEX MICROSCOPIC
Bilirubin Urine: NEGATIVE
Hgb urine dipstick: NEGATIVE
Ketones, ur: NEGATIVE
Leukocytes,Ua: NEGATIVE
Nitrite: NEGATIVE
RBC / HPF: NONE SEEN
Specific Gravity, Urine: 1.02 (ref 1.000–1.030)
Total Protein, Urine: NEGATIVE
Urine Glucose: NEGATIVE
Urobilinogen, UA: 0.2 (ref 0.0–1.0)
pH: 6 (ref 5.0–8.0)

## 2024-08-11 LAB — HEPATIC FUNCTION PANEL
ALT: 104 U/L — ABNORMAL HIGH (ref 3–35)
AST: 60 U/L — ABNORMAL HIGH (ref 5–37)
Albumin: 4.6 g/dL (ref 3.5–5.2)
Alkaline Phosphatase: 63 U/L (ref 39–117)
Bilirubin, Direct: 0.1 mg/dL (ref 0.1–0.3)
Total Bilirubin: 0.5 mg/dL (ref 0.2–1.2)
Total Protein: 8.1 g/dL (ref 6.0–8.3)

## 2024-08-11 LAB — HEMOGLOBIN A1C: Hgb A1c MFr Bld: 5.8 % (ref 4.6–6.5)

## 2024-08-11 LAB — TSH: TSH: 1.99 u[IU]/mL (ref 0.35–5.50)

## 2024-08-11 LAB — VITAMIN D 25 HYDROXY (VIT D DEFICIENCY, FRACTURES): VITD: 18.02 ng/mL — ABNORMAL LOW (ref 30.00–100.00)

## 2024-08-11 LAB — MICROALBUMIN / CREATININE URINE RATIO
Creatinine,U: 82.6 mg/dL
Microalb Creat Ratio: UNDETERMINED mg/g (ref 0.0–30.0)
Microalb, Ur: <0.7 mg/dL

## 2024-08-11 LAB — VITAMIN B12: Vitamin B-12: 666 pg/mL (ref 211–911)

## 2024-08-11 MED ORDER — TIRZEPATIDE-WEIGHT MANAGEMENT 2.5 MG/0.5ML ~~LOC~~ SOLN: 2.5000 mg | SUBCUTANEOUS | 11 refills | Status: DC

## 2024-08-11 NOTE — Progress Notes (Unsigned)
 Patient ID: [name], female   DOB: [birth year], 53 y.o.   MRN: [id number]         Chief Complaint:: wellness exam and Annual Exam (Spasms in lower abdomen has been going on for a few weeks but has resolved now , also  has a big bruise on her right arm for the past 2 weeks )  , wt gain obesity, low vit d, low thryoid, hyperglycemia, hld       HPI:  [name] is a 53 y.o. female here for wellness ;declines tdap and hep B vax for now, for flu shot today,, o/w up to date, sees gyn yearly.                      Also Pt denies chest pain, increased sob or doe, wheezing, orthopnea, PND, increased LE swelling, palpitations, dizziness or syncope.   Pt denies polydipsia, polyuria, or new focal neuro s/s.   Does have unusual bruising to the right upper lateral arm but just can't recall how this might have occurred.  No other bruising or bleeding.   Does have mild lower abd pain incidentally for several days, with intermittent nausea but no fever, and Denies urinary symptoms such as dysuria, frequency, urgency, flank pain, hematuria or n/v, fever, chills. Has gained over 15 lbs recently, now with mild doe with going up stairs only.   Wt Readings from Last 3 Encounters:  08/11/24 195 lb (88.5 kg)  10/10/23 183 lb (83 kg)  06/10/23 193 lb (87.5 kg)   BP Readings from Last 3 Encounters:  08/11/24 122/74  10/10/23 122/80  06/10/23 122/76   Immunization History  Administered Date(s) Administered   Influenza Split 04/26/2011   Influenza Whole 05/02/2010   Influenza, Seasonal, Injecte, Preservative Fre 06/10/2023, 08/11/2024   Influenza,inj,Quad PF,6+ Mos 06/07/2015, 05/31/2017, 07/09/2018, 06/25/2019   PFIZER(Purple Top)SARS-COV-2 Vaccination 04/22/2020   PNEUMOCOCCAL CONJUGATE-20 05/18/2024   Td 04/13/2004   Tdap 11/26/2013   Zoster Recombinant(Shingrix ) 06/11/2022, 08/20/2022, 10/31/2022   Health Maintenance Due  Topic Date Due   Hepatitis B Vaccines 19-59 Average Risk (1 of 3 - 19+ 3-dose  series) Never done   Cervical Cancer Screening (HPV/Pap Cotest)  Never done   DTaP/Tdap/Td (3 - Td or Tdap) 11/27/2023      Past Medical History:  Diagnosis Date   Abdominal pain, epigastric 06/19/2010   Allergic rhinitis 07/09/2018   Allergy    Cancer (HCC)    thyroid  cancer    DEPRESSION 05/02/2010   HLD (hyperlipidemia) 07/09/2018   HYPERTHYROIDISM 05/02/2010   HYPOTHYROIDISM 05/02/2010   INSOMNIA 05/02/2010   NAUSEA 06/19/2010   TRANSAMINASES, SERUM, ELEVATED 05/02/2010   Past Surgical History:  Procedure Laterality Date   APPENDECTOMY     CESAREAN SECTION     x 2   CHOLECYSTECTOMY  2011   LUMBAR LAMINECTOMY/DECOMPRESSION MICRODISCECTOMY N/A 01/13/2016   Procedure: MICRO LUMBAR DECOMPRESSION L5-S1, L4-L5;  Surgeon: Reyes Billing, MD;  Location: [facility];  Service: Orthopedics;  Laterality: N/A;   s/p compound fx left femur s/p rod, now removed     TUBAL LIGATION      reports that she has never smoked. She has never used smokeless tobacco. She reports current alcohol use. She reports that she does not use drugs. family history includes Alcohol abuse in her father and mother; Breast cancer in an other family member; COPD in some other family members; Cancer in her paternal uncle; Depression in her mother; Diabetes in her father and  another family member; Heart disease in her father and another family member; Hyperlipidemia in her father and another family member; Hypertension in her father and another family member; Stroke in her mother and another family member. Allergies[1] Medications Ordered Prior to Encounter[2]      ROS:  All others reviewed and negative.  Objective        PE:  BP 122/74 (BP Location: Left Arm, Patient Position: Sitting, Cuff Size: Normal)   Pulse 65   Temp 98.2 F (36.8 C) (Oral)   Ht 5' 7 (1.702 m)   Wt 195 lb (88.5 kg)   LMP 01/02/2016   SpO2 99%   BMI 30.54 kg/m                 Constitutional: Pt appears in NAD               HENT: Head: NCAT.                 Right Ear: External ear normal.                 Left Ear: External ear normal.                Eyes: . Pupils are equal, round, and reactive to light. Conjunctivae and EOM are normal               Nose: without d/c or deformity               Neck: Neck supple. Gross normal ROM               Cardiovascular: Normal rate and regular rhythm.                 Pulmonary/Chest: Effort normal and breath sounds without rales or wheezing.                Abd:  Soft, NT, ND, + BS, no organomegaly               Neurological: Pt is alert. At baseline orientation, motor grossly intact               Skin: Skin is warm. No rashes, has large 2-3 cm bruising to righ deltoid area,  LE edema - none               Psychiatric: Pt behavior is normal without agitation   Micro: none  Cardiac tracings I have personally interpreted today:  none  Pertinent Radiological findings (summarize): none   Lab Results  Component Value Date   WBC 6.0 08/11/2024   HGB 14.5 08/11/2024   HCT 42.8 08/11/2024   PLT 238.0 08/11/2024   GLUCOSE 85 08/11/2024   CHOL 190 08/11/2024   TRIG 88.0 08/11/2024   HDL 72.80 08/11/2024   LDLCALC 100 (H) 08/11/2024   ALT 104 (H) 08/11/2024   AST 60 (H) 08/11/2024   NA 138 08/11/2024   K 4.1 08/11/2024   CL 101 08/11/2024   CREATININE 0.71 08/11/2024   BUN 15 08/11/2024   CO2 28 08/11/2024   TSH 1.99 08/11/2024   HGBA1C 5.8 08/11/2024   MICROALBUR <0.7 08/11/2024   Assessment/Plan:  [name] is a 53 y.o. White or Caucasian [1] female with  has a past medical history of Abdominal pain, epigastric (06/19/2010), Allergic rhinitis (07/09/2018), Allergy, Cancer (HCC), DEPRESSION (05/02/2010), HLD (hyperlipidemia) (07/09/2018), HYPERTHYROIDISM (05/02/2010), HYPOTHYROIDISM (05/02/2010), INSOMNIA (05/02/2010), NAUSEA (06/19/2010), and TRANSAMINASES, SERUM, ELEVATED (05/02/2010).  Encounter for well  adult exam with abnormal findings Age and sex appropriate education and counseling  updated with regular exercise and diet Referrals for preventative services - none needed Immunizations addressed - for flu shot today, declines tdap and hep B vax Smoking counseling  - none needed Evidence for depression or other mood disorder - chronic anxiety stable Most recent labs reviewed. I have personally reviewed and have noted: 1) the patient's medical and social history 2) The patient's current medications and supplements 3) The patient's height, weight, and BMI have been recorded in the chart   Vitamin D  deficiency Last vitamin D  Lab Results  Component Value Date   VD25OH 18.02 (L) 08/11/2024   Low, to start oral replacement   Lower abdominal pain Etiology unclear, will need lipase and urine  cbc with labs  Hypothyroidism Lab Results  Component Value Date   TSH 1.99 08/11/2024   Stable, pt to continue levothyroxine  112 mcg qd   Hyperglycemia Lab Results  Component Value Date   HGBA1C 5.8 08/11/2024   Stable, pt to continue current medical treatment  - diet,, wt control   HLD (hyperlipidemia) Lab Results  Component Value Date   LDLCALC 100 (H) 08/11/2024   Uncontrolled, pt for lower chol diet, declines statin   Easy bruising Large bruising to right deltoid area, but no other, for plt with labs, cont to monitor  Obesity (BMI 30-39.9) Mild to mod, now for zepbound 2.5 mg weekly start with intent to titrate  Followup: Return in about 1 year (around 08/11/2025).  Lynwood Rush, MD 08/12/2024 7:50 PM [facility]     [1]  Allergies Allergen Reactions   Sulfa Antibiotics Hives  [2]  Current Outpatient Medications on File Prior to Visit  Medication Sig Dispense Refill   buPROPion  (WELLBUTRIN  XL) 150 MG 24 hr tablet Take 1 tablet (150 mg total) by mouth daily. 90 tablet 3   citalopram  (CELEXA ) 20 MG tablet Take 1 tablet (20 mg total) by mouth daily. 90 tablet 3   clonazePAM   (KLONOPIN ) 0.5 MG tablet Take 1 tablet (0.5 mg total) by mouth 2 (two) times daily as needed for anxiety. 60 tablet 1   estradiol  (VIVELLE -DOT) 0.0375 MG/24HR Place 1 patch onto the skin 2 (two) times a week. 8 patch 1   estradiol  (VIVELLE -DOT) 0.0375 MG/24HR Place 1 patch onto the skin 2 (two) times a week. 8 patch 1   estradiol  (VIVELLE -DOT) 0.0375 MG/24HR Place 1 patch onto the skin 2 (two) times a week. 8 patch 1   estradiol  (VIVELLE -DOT) 0.0375 MG/24HR Place 1 patch onto the skin 2 (two) times a week. 24 patch 0   estradiol  (VIVELLE -DOT) 0.0375 MG/24HR Place 1 patch onto the skin 2 (two) times a week. 24 patch 0   estradiol  (VIVELLE -DOT) 0.05 MG/24HR patch Place 1 patch (0.05 mg total) onto the skin 2 (two) times a week. 8 patch 3   fluticasone  (FLONASE ) 50 MCG/ACT nasal spray Place 2 sprays into both nostrils daily. (Patient not taking: Reported on 10/10/2023) 16 g 6   ibuprofen  (ADVIL ) 600 MG tablet Take 1 tablet (600 mg total) by mouth every 8 (eight) hours as needed. (Patient not taking: Reported on 10/10/2023) 30 tablet 0   ketorolac  (ACULAR ) 0.5 % ophthalmic solution Place 1 drop into right eye 3 times a day for 1 week, 2 times a day for 1 week, then daily for 14 days starting the next day after surgery (Patient not taking: Reported on  10/10/2023) 5 mL 0   levothyroxine  (SYNTHROID ) 112 MCG tablet TAKE 1 TABLET BY MOUTH ONCE DAILY BEFORE BREAKFAST 90 tablet 3   levothyroxine  (SYNTHROID ) 112 MCG tablet Take 1 tablet (112 mcg total) by mouth in the morning on an empty stomach 90 tablet 1   liothyronine  (CYTOMEL ) 5 MCG tablet Take 1 tablet (5 mcg total) by mouth daily for 7 days, THEN 2 tablets (10 mcg total) daily. 60 tablet 2   liothyronine  (CYTOMEL ) 5 MCG tablet Take 3 tablets (15 mcg total) by mouth daily. 270 tablet 1   LORazepam  (ATIVAN ) 0.5 MG tablet Take 1 tablet (0.5 mg total) by mouth 2 (two) times daily as needed for anxiety. 60 tablet 1   ofloxacin  (OCUFLOX ) 0.3 % ophthalmic solution  Place 1 drop into right eye 3 times a day for 1 week , THEN 2 times a day for 1 week THEN once a day for 14 days---starting the next day after surgery (Patient not taking: Reported on 10/10/2023) 5 mL 0   ondansetron  (ZOFRAN ) 4 MG tablet Take 1 tablet (4 mg total) by mouth every 8 (eight) hours as needed for nausea or vomiting. (Patient not taking: Reported on 10/10/2023) 20 tablet 0   phentermine  30 MG capsule Take 1 capsule (30 mg total) by mouth every morning. (Patient not taking: Reported on 10/10/2023) 30 capsule 5   prednisoLONE  acetate (PRED FORTE ) 1 % ophthalmic suspension Place one drop in right eye 4 times a day for 1 month. (Patient not taking: Reported on 10/10/2023) 5 mL 0   progesterone  (PROMETRIUM ) 100 MG capsule Take 1 capsule (100 mg total) by mouth at bedtime. 30 capsule 3   progesterone  (PROMETRIUM ) 100 MG capsule Take 1 capsule (100 mg total) by mouth at bedtime. 30 capsule 3   progesterone  (PROMETRIUM ) 100 MG capsule Take 1 - 2 capsules (100-200 mg total) by mouth at bedtime. 180 capsule 0   progesterone  (PROMETRIUM ) 100 MG capsule Take 1-2 capsules (100-200 mg total) by mouth at bedtime. 180 capsule 1   promethazine -dextromethorphan (PROMETHAZINE -DM) 6.25-15 MG/5ML syrup Take 5 mLs by mouth 4 (four) times daily as needed. (Patient not taking: Reported on 10/10/2023) 118 mL 0   Semaglutide -Weight Management (WEGOVY ) 1 MG/0.5ML SOAJ Inject 1 mg into the skin once a week. (Patient not taking: Reported on 10/10/2023) 6 mL 3   topiramate  (TOPAMAX ) 50 MG tablet Take 1 tablet (50 mg total) by mouth 2 (two) times daily. (Patient not taking: Reported on 10/10/2023) 60 tablet 5   Zoster Vaccine Adjuvanted (SHINGRIX ) injection Inject into the muscle. (Patient not taking: Reported on 10/10/2023) 0.5 mL 0   No current facility-administered medications on file prior to visit.

## 2024-08-11 NOTE — Patient Instructions (Addendum)
 Please take all new medication as prescribed - the zepbound 2.5 mg weekly, but call or mychart message in 1 month if you are doing well, in order to increase the dose.   You had the flu shot today  Please continue all other medications as before, and refills have been done if requested.  Please have the pharmacy call with any other refills you may need.  Please continue your efforts at being more active, low cholesterol diet, and weight control.  You are otherwise up to date with prevention measures today.  Please keep your appointments with your specialists as you may have planned  Please go to the LAB at the blood drawing area for the tests to be done  You will be contacted by phone if any changes need to be made immediately.  Otherwise, you will receive a letter about your results with an explanation, but please check with MyChart first.  Please make an Appointment to return for your 1 year visit, or sooner if needed

## 2024-08-12 ENCOUNTER — Other Ambulatory Visit: Payer: Self-pay | Admitting: Internal Medicine

## 2024-08-12 ENCOUNTER — Encounter: Payer: Self-pay | Admitting: Internal Medicine

## 2024-08-12 ENCOUNTER — Ambulatory Visit: Payer: Self-pay | Admitting: Internal Medicine

## 2024-08-12 DIAGNOSIS — R7989 Other specified abnormal findings of blood chemistry: Secondary | ICD-10-CM

## 2024-08-12 DIAGNOSIS — R233 Spontaneous ecchymoses: Secondary | ICD-10-CM | POA: Insufficient documentation

## 2024-08-12 LAB — URINE CULTURE: Result:: NO GROWTH

## 2024-08-12 NOTE — Assessment & Plan Note (Signed)
 Lab Results  Component Value Date   HGBA1C 5.8 08/11/2024   Stable, pt to continue current medical treatment  - diet,, wt control

## 2024-08-12 NOTE — Assessment & Plan Note (Signed)
 Etiology unclear, will need lipase and urine  cbc with labs

## 2024-08-12 NOTE — Assessment & Plan Note (Signed)
 Last vitamin D  Lab Results  Component Value Date   VD25OH 18.02 (L) 08/11/2024   Low, to start oral replacement

## 2024-08-12 NOTE — Assessment & Plan Note (Signed)
 Age and sex appropriate education and counseling updated with regular exercise and diet Referrals for preventative services - none needed Immunizations addressed - for flu shot today, declines tdap and hep B vax Smoking counseling  - none needed Evidence for depression or other mood disorder - chronic anxiety stable Most recent labs reviewed. I have personally reviewed and have noted: 1) the patient's medical and social history 2) The patient's current medications and supplements 3) The patient's height, weight, and BMI have been recorded in the chart

## 2024-08-12 NOTE — Assessment & Plan Note (Signed)
 Lab Results  Component Value Date   TSH 1.99 08/11/2024   Stable, pt to continue levothyroxine  112 mcg qd

## 2024-08-12 NOTE — Assessment & Plan Note (Signed)
 Mild to mod, now for zepbound 2.5 mg weekly start with intent to titrate

## 2024-08-12 NOTE — Assessment & Plan Note (Signed)
 Lab Results  Component Value Date   LDLCALC 100 (H) 08/11/2024   Uncontrolled, pt for lower chol diet, declines statin

## 2024-08-12 NOTE — Assessment & Plan Note (Signed)
 Large bruising to right deltoid area, but no other, for plt with labs, cont to monitor

## 2024-09-02 ENCOUNTER — Telehealth: Payer: Self-pay | Admitting: Internal Medicine

## 2024-09-02 ENCOUNTER — Telehealth: Payer: Self-pay

## 2024-09-02 NOTE — Telephone Encounter (Signed)
 Copied from CRM #[id number]. Topic: Appointments - Transfer of Care >> Sep 02, 2024  1:56 PM Nessti S wrote: Pt is requesting to transfer FROM: [facility] Pt is requesting to transfer TO: [facility] Reason for requested transfer: DR NORLEEN IS RETIRING It is the responsibility of the team the patient would like to transfer to (Dr. CLEATUS SPECKING) to reach out to the patient if for any reason this transfer is not acceptable.

## 2024-09-02 NOTE — Telephone Encounter (Signed)
 Copied from CRM #[id number]. Topic: Clinical - Medication Refill >> Sep 02, 2024  1:51 PM Ivette P wrote: Medication: levothyroxine  (SYNTHROID ) 112 MCG tablet  - 3 month supply   Has the patient contacted their pharmacy? Yes (Agent: If no, request that the patient contact the pharmacy for the refill. If patient does not wish to contact the pharmacy document the reason why and proceed with request.) (Agent: If yes, when and what did the pharmacy advise?)  This is the patient's preferred pharmacy:  [id number] - [place], [place] - [address] [place] [address] Phone: [phone] Fax: [phone]  Is this the correct pharmacy for this prescription? Yes If no, delete pharmacy and type the correct one.   Has the prescription been filled recently? No  Is the patient out of the medication? Yes, was supposed ot have refill sne ton visit 08/11/2024  Has the patient been seen for an appointment in the last year OR does the patient have an upcoming appointment? Yes  Can we respond through MyChart? Yes  Agent: Please be advised that Rx refills may take up to 3 business days. We ask that you follow-up with your pharmacy.

## 2024-09-02 NOTE — Telephone Encounter (Unsigned)
 Copied from CRM #[id number]. Topic: Clinical - Medication Refill >> Sep 02, 2024  1:51 PM Ivette P wrote: Medication: levothyroxine  (SYNTHROID ) 112 MCG tablet  - 3 month supply   Has the patient contacted their pharmacy? Yes (Agent: If no, request that the patient contact the pharmacy for the refill. If patient does not wish to contact the pharmacy document the reason why and proceed with request.) (Agent: If yes, when and what did the pharmacy advise?)  This is the patient's preferred pharmacy:  [id number] - [place], [place] - [address] [place] [address] Phone: [phone] Fax: [phone]  Is this the correct pharmacy for this prescription? Yes If no, delete pharmacy and type the correct one.   Has the prescription been filled recently? No  Is the patient out of the medication? Yes, was supposed ot have refill sne ton visit 08/11/2024  Has the patient been seen for an appointment in the last year OR does the patient have an upcoming appointment? Yes  Can we respond through MyChart? Yes  Agent: Please be advised that Rx refills may take up to 3 business days. We ask that you follow-up with your pharmacy.

## 2024-09-04 ENCOUNTER — Other Ambulatory Visit: Payer: Self-pay

## 2024-09-04 MED ORDER — LEVOTHYROXINE SODIUM 112 MCG PO TABS: ORAL_TABLET | ORAL | 3 refills | Status: AC

## 2024-09-04 NOTE — Telephone Encounter (Signed)
 Duplicate message

## 2024-09-08 ENCOUNTER — Other Ambulatory Visit: Payer: Self-pay | Admitting: Family Medicine

## 2024-09-08 DIAGNOSIS — R2242 Localized swelling, mass and lump, left lower limb: Secondary | ICD-10-CM

## 2024-09-10 ENCOUNTER — Encounter: Payer: Self-pay | Admitting: Student in an Organized Health Care Education/Training Program

## 2024-09-10 ENCOUNTER — Ambulatory Visit (INDEPENDENT_AMBULATORY_CARE_PROVIDER_SITE_OTHER): Admitting: Student in an Organized Health Care Education/Training Program

## 2024-09-10 VITALS — BP 123/51 | HR 57 | Temp 97.9°F | Ht 67.5 in | Wt 193.0 lb

## 2024-09-10 DIAGNOSIS — I872 Venous insufficiency (chronic) (peripheral): Secondary | ICD-10-CM

## 2024-09-10 DIAGNOSIS — E669 Obesity, unspecified: Secondary | ICD-10-CM

## 2024-09-10 DIAGNOSIS — Z79890 Hormone replacement therapy (postmenopausal): Secondary | ICD-10-CM

## 2024-09-10 DIAGNOSIS — E890 Postprocedural hypothyroidism: Secondary | ICD-10-CM | POA: Diagnosis not present

## 2024-09-10 MED ORDER — ESTRADIOL 0.05 MG/24HR TD PTTW: 1.0000 | MEDICATED_PATCH | TRANSDERMAL | 3 refills | Status: AC

## 2024-09-10 NOTE — Assessment & Plan Note (Signed)
 Long-standing hypothyroidism is managed with Synthroid  125 mcg, maintaining a well-controlled TSH at 3.6. Lifelong thyroid  replacement therapy is likely. Continue Synthroid  125 mcg daily and monitor TSH levels regularly.

## 2024-09-10 NOTE — Progress Notes (Signed)
 "  Established Patient Office Visit  Patient ID: [name], female    DOB: 03-24-1971  Age: 54 y.o. MRN: [id number] PCP: Jerrell Cleatus Ned, MD  Chief Complaint  Patient presents with   Transitions Of Care    Patient is here to establish care.     Subjective:     HPI  Discussed the use of AI scribe software for clinical note transcription with the patient, who gave verbal consent to proceed.  History of Present Illness [name] is a 54 year old female who presents for management of her thyroid  medication and hormone replacement therapy.  She is currently on Synthroid , with a recent dose increase to 125 mcg from 112 mcg. She has been on thyroid  medication since her thirties following radiation therapy for hyperthyroidism and goiter. Her thyroid  levels are regularly monitored. She has not undergone thyroid  surgery.  She is also on hormone replacement therapy, having switched to the Dot patch from estradiol . She uses two patches per week and takes progesterone  due to having an intact uterus. Her hormone levels are monitored at [facility], where her thyroid  levels are also checked.  She has a history of anxiety and underwent back surgery approximately seven years ago due to a work-related injury that nearly severed her sciatic nerve. Post-surgery, she experiences swelling on the right side, which is being evaluated at a vein clinic. She has undergone an ultrasound and is awaiting further scans to assess the hip area.  She has attempted weight loss medications in the past, including Wegovy , which she discontinued due to side effects such as headaches and nausea. She is currently trying to manage her weight through other means. She is physically active at work, walking five to six miles per shift and climbing several flights of stairs.  Her social history includes working as a merchandiser, retail at a [employer], living with her partner [name], and having four grown children and five  grandchildren. She has a history of anxiety, which led to a job loss, but she has since found new employment. No issues with diabetes or high blood pressure, though she reports low blood pressure as normal for her. No current physical limitations at work despite her back history.     Objective:     BP (!) 123/51   Pulse (!) 57   Temp 97.9 F (36.6 C) (Oral)   Ht 5' 7.5 (1.715 m)   Wt 193 lb (87.5 kg)   LMP 01/02/2016   SpO2 99%   BMI 29.78 kg/m   Physical Exam  Gen: Well-appearing woman Neck: Normal thyroid , no nodules or adenopathy Heart: Mildly bradycardic, no murmur Lungs: unlabored, clear throughout Ext: Warm, 1+ pitting edema in the right lower extremity, trace edema in the left    Assessment & Plan:   Problem List Items Addressed This Visit       High   Hormone replacement therapy (HRT) (Chronic)   She is on Dot (estradiol ) and progesterone . The importance of progesterone  for uterine protection was discussed. Continue Dot (estradiol ) and progesterone  therapy, ensuring concurrent use. The prescription for estradiol  patches has been refilled.      Relevant Medications   estradiol  (VIVELLE -DOT) 0.05 MG/24HR patch   Obesity (BMI 30-39.9) - Primary (Chronic)   Her weight has increased to 193 lbs, BMI of 30. A previous Wegovy  trial was not tolerated. Lifestyle modifications are ongoing, with no diabetes or hypertension present. Encourage continued lifestyle modifications for weight management.  Medium    Hypothyroidism (Chronic)   Long-standing hypothyroidism is managed with Synthroid  125 mcg, maintaining a well-controlled TSH at 3.6. Lifelong thyroid  replacement therapy is likely. Continue Synthroid  125 mcg daily and monitor TSH levels regularly.        Low   Chronic venous insufficiency (Chronic)   Chronic swelling persists likely related to venous insufficiency.  Has had management with vascular surgery.  No history of DVT.  Continue vein therapy and use  compression stockings.         Return in about 6 months (around 03/10/2025).    Cleatus [name], MD [facility]   "

## 2024-09-10 NOTE — Assessment & Plan Note (Signed)
 She is on Dot (estradiol ) and progesterone . The importance of progesterone  for uterine protection was discussed. Continue Dot (estradiol ) and progesterone  therapy, ensuring concurrent use. The prescription for estradiol  patches has been refilled.

## 2024-09-10 NOTE — Patient Instructions (Signed)
" °  VISIT SUMMARY: During your visit, we discussed the management of your thyroid  medication and hormone replacement therapy. We also reviewed your weight management efforts, chronic swelling in your right leg, and general health maintenance.  YOUR PLAN: -HYPOTHYROIDISM: Hypothyroidism is a condition where your thyroid  gland does not produce enough thyroid  hormone. You will continue taking Synthroid  125 mcg daily to manage this condition, and we will monitor your TSH levels regularly.  -MENOPAUSAL HORMONE THERAPY: Menopausal hormone therapy helps manage symptoms of menopause. You will continue using the Dot (estradiol ) patch and taking progesterone  to protect your uterus. Your prescription for estradiol  patches has been refilled.  -OBESITY: Obesity is a condition characterized by excessive body weight. You will continue with lifestyle modifications to manage your weight, as previous medication trials were not well-tolerated. There are no concerns about diabetes or hypertension.  -CHRONIC RIGHT LOWER EXTREMITY SWELLING POST LUMBAR SPINE SURGERY: Chronic swelling in your right leg is a result of your previous back surgery. You will continue vein therapy and use compression stockings. We will proceed with the scheduled imaging to assess your hip.  -GENERAL HEALTH MAINTENANCE: You are proactive with your health maintenance, including regular gynecological follow-ups for mammograms and Pap smears. Continue these routine screenings with your gynecologist.  INSTRUCTIONS: Please continue taking Synthroid  125 mcg daily and monitor your TSH levels regularly. Use the Dot (estradiol ) patch and take progesterone  as prescribed. Maintain your lifestyle modifications for weight management. Continue vein therapy and use compression stockings for your leg swelling. Proceed with the scheduled imaging for your hip assessment. Keep up with your regular gynecological follow-ups for routine screenings.    Contains text  generated by [employer].   "

## 2024-09-10 NOTE — Assessment & Plan Note (Addendum)
 Chronic swelling persists likely related to venous insufficiency.  Has had management with vascular surgery.  No history of DVT.  Continue vein therapy and use compression stockings.

## 2024-09-10 NOTE — Assessment & Plan Note (Signed)
 Her weight has increased to 193 lbs, BMI of 30. A previous Wegovy  trial was not tolerated. Lifestyle modifications are ongoing, with no diabetes or hypertension present. Encourage continued lifestyle modifications for weight management.

## 2024-09-18 ENCOUNTER — Inpatient Hospital Stay: Admission: RE | Admit: 2024-09-18

## 2024-09-29 ENCOUNTER — Other Ambulatory Visit
